# Patient Record
Sex: Female | Born: 1975 | Race: White | Hispanic: No | Marital: Single | Smoking: Never smoker
Health system: Southern US, Community
[De-identification: ages and names within clinical notes are randomized; demographics above are authoritative.]

## PROBLEM LIST (undated history)

## (undated) DIAGNOSIS — T7840XA Allergy, unspecified, initial encounter: Secondary | ICD-10-CM

## (undated) HISTORY — PX: OTHER SURGICAL HISTORY: SHX169

## (undated) HISTORY — DX: Allergy, unspecified, initial encounter: T78.40XA

---

## 2008-05-13 ENCOUNTER — Ambulatory Visit: Payer: Self-pay | Admitting: Unknown Physician Specialty

## 2019-12-11 ENCOUNTER — Ambulatory Visit: Payer: BC Managed Care – PPO | Attending: Internal Medicine

## 2019-12-11 DIAGNOSIS — Z20822 Contact with and (suspected) exposure to covid-19: Secondary | ICD-10-CM | POA: Insufficient documentation

## 2019-12-12 LAB — NOVEL CORONAVIRUS, NAA: SARS-CoV-2, NAA: NOT DETECTED

## 2020-03-18 ENCOUNTER — Encounter: Payer: Self-pay | Admitting: Emergency Medicine

## 2020-03-18 ENCOUNTER — Other Ambulatory Visit: Payer: Self-pay

## 2020-03-18 ENCOUNTER — Inpatient Hospital Stay
Admission: EM | Admit: 2020-03-18 | Discharge: 2020-03-21 | DRG: 580 | Disposition: A | Discharge status: Home / Self Care | Payer: BC Managed Care – PPO | Attending: Internal Medicine | Admitting: Internal Medicine

## 2020-03-18 DIAGNOSIS — L02413 Cutaneous abscess of right upper limb: Secondary | ICD-10-CM | POA: Diagnosis present

## 2020-03-18 DIAGNOSIS — Z6841 Body Mass Index (BMI) 40.0 and over, adult: Secondary | ICD-10-CM

## 2020-03-18 DIAGNOSIS — L03311 Cellulitis of abdominal wall: Secondary | ICD-10-CM | POA: Diagnosis not present

## 2020-03-18 DIAGNOSIS — L02412 Cutaneous abscess of left axilla: Secondary | ICD-10-CM | POA: Diagnosis present

## 2020-03-18 DIAGNOSIS — L02421 Furuncle of right axilla: Secondary | ICD-10-CM | POA: Diagnosis present

## 2020-03-18 DIAGNOSIS — L02211 Cutaneous abscess of abdominal wall: Secondary | ICD-10-CM | POA: Diagnosis not present

## 2020-03-18 DIAGNOSIS — R7303 Prediabetes: Secondary | ICD-10-CM | POA: Diagnosis present

## 2020-03-18 DIAGNOSIS — Z20822 Contact with and (suspected) exposure to covid-19: Secondary | ICD-10-CM | POA: Diagnosis present

## 2020-03-18 HISTORY — DX: Cutaneous abscess of abdominal wall: L02.211

## 2020-03-18 HISTORY — DX: Cellulitis of abdominal wall: L03.311

## 2020-03-18 HISTORY — DX: Cutaneous abscess of right upper limb: L02.413

## 2020-03-18 LAB — CBC WITH DIFFERENTIAL/PLATELET
Abs Immature Granulocytes: 0.04 10*3/uL (ref 0.00–0.07)
Basophils Absolute: 0 10*3/uL (ref 0.0–0.1)
Basophils Relative: 0 %
Eosinophils Absolute: 0.1 10*3/uL (ref 0.0–0.5)
Eosinophils Relative: 1 %
HCT: 38.3 % (ref 36.0–46.0)
Hemoglobin: 11.9 g/dL — ABNORMAL LOW (ref 12.0–15.0)
Immature Granulocytes: 0 %
Lymphocytes Relative: 13 %
Lymphs Abs: 1.5 10*3/uL (ref 0.7–4.0)
MCH: 26 pg (ref 26.0–34.0)
MCHC: 31.1 g/dL (ref 30.0–36.0)
MCV: 83.8 fL (ref 80.0–100.0)
Monocytes Absolute: 0.8 10*3/uL (ref 0.1–1.0)
Monocytes Relative: 6 %
Neutro Abs: 9.5 10*3/uL — ABNORMAL HIGH (ref 1.7–7.7)
Neutrophils Relative %: 80 %
Platelets: 270 10*3/uL (ref 150–400)
RBC: 4.57 MIL/uL (ref 3.87–5.11)
RDW: 15.4 % (ref 11.5–15.5)
WBC: 12 10*3/uL — ABNORMAL HIGH (ref 4.0–10.5)
nRBC: 0 % (ref 0.0–0.2)

## 2020-03-18 LAB — COMPREHENSIVE METABOLIC PANEL
ALT: 17 U/L (ref 0–44)
AST: 21 U/L (ref 15–41)
Albumin: 3.8 g/dL (ref 3.5–5.0)
Alkaline Phosphatase: 73 U/L (ref 38–126)
Anion gap: 10 (ref 5–15)
BUN: 11 mg/dL (ref 6–20)
CO2: 26 mmol/L (ref 22–32)
Calcium: 9 mg/dL (ref 8.9–10.3)
Chloride: 101 mmol/L (ref 98–111)
Creatinine, Ser: 0.68 mg/dL (ref 0.44–1.00)
GFR calc Af Amer: >60 mL/min (ref >60)
GFR calc non Af Amer: >60 mL/min (ref >60)
Glucose, Bld: 95 mg/dL (ref 70–99)
Potassium: 4.4 mmol/L (ref 3.5–5.1)
Sodium: 137 mmol/L (ref 135–145)
Total Bilirubin: 1.4 mg/dL — ABNORMAL HIGH (ref 0.3–1.2)
Total Protein: 8.1 g/dL (ref 6.5–8.1)

## 2020-03-18 LAB — RESPIRATORY PANEL BY RT PCR (FLU A&B, COVID)
Influenza A by PCR: NEGATIVE
Influenza B by PCR: NEGATIVE
SARS Coronavirus 2 by RT PCR: NEGATIVE

## 2020-03-18 LAB — SEDIMENTATION RATE: Sed Rate: 73 mm/hr — ABNORMAL HIGH (ref 0–20)

## 2020-03-18 LAB — LACTIC ACID, PLASMA
Lactic Acid, Venous: 1 mmol/L (ref 0.5–1.9)
Lactic Acid, Venous: 0.9 mmol/L (ref 0.5–1.9)

## 2020-03-18 MED ORDER — SODIUM CHLORIDE 0.9 % IV SOLN
2.0000 g | Freq: Once | INTRAVENOUS | Status: AC
  Administered 2020-03-18: 2 g via INTRAVENOUS
  Filled 2020-03-18: qty 20

## 2020-03-18 MED ORDER — HYDROMORPHONE HCL 1 MG/ML IJ SOLN: 1.0000 mg | INTRAMUSCULAR | Status: DC | PRN

## 2020-03-18 MED ORDER — SODIUM BICARBONATE 8.4 % IV SOLN: 50.0000 meq | Freq: Once | INTRAVENOUS | Status: DC

## 2020-03-18 MED ORDER — OXYCODONE-ACETAMINOPHEN 5-325 MG PO TABS: 1.0000 | ORAL_TABLET | ORAL | Status: DC | PRN

## 2020-03-18 MED ORDER — SODIUM BICARBONATE 4 % IV SOLN
5.0000 mL | Freq: Once | INTRAVENOUS | Status: DC
  Filled 2020-03-18: qty 5

## 2020-03-18 MED ORDER — HYDROMORPHONE HCL 1 MG/ML IJ SOLN
1.0000 mg | Freq: Once | INTRAMUSCULAR | Status: AC
  Administered 2020-03-18: 1 mg via INTRAVENOUS
  Filled 2020-03-18: qty 1

## 2020-03-18 MED ORDER — ACETAMINOPHEN 325 MG PO TABS: 650.0000 mg | ORAL_TABLET | Freq: Four times a day (QID) | ORAL | Status: DC | PRN

## 2020-03-18 MED ORDER — VANCOMYCIN HCL 1250 MG/250ML IV SOLN
1250.0000 mg | INTRAVENOUS | Status: AC
  Administered 2020-03-18 (×2): 1250 mg via INTRAVENOUS
  Filled 2020-03-18 (×3): qty 250

## 2020-03-18 MED ORDER — ONDANSETRON HCL 4 MG/2ML IJ SOLN
4.0000 mg | Freq: Three times a day (TID) | INTRAMUSCULAR | Status: DC | PRN
  Administered 2020-03-18: 4 mg via INTRAVENOUS
  Filled 2020-03-18: qty 2

## 2020-03-18 MED ORDER — ENOXAPARIN SODIUM 40 MG/0.4ML ~~LOC~~ SOLN
40.0000 mg | Freq: Two times a day (BID) | SUBCUTANEOUS | Status: DC
  Administered 2020-03-18 – 2020-03-21 (×6): 40 mg via SUBCUTANEOUS
  Filled 2020-03-18 (×6): qty 0.4

## 2020-03-18 MED ORDER — LIDOCAINE-EPINEPHRINE 2 %-1:100000 IJ SOLN
20.0000 mL | Freq: Once | INTRAMUSCULAR | Status: AC
  Administered 2020-03-18: 20 mL via INTRADERMAL
  Filled 2020-03-18: qty 1

## 2020-03-18 MED ORDER — ONDANSETRON HCL 4 MG/2ML IJ SOLN
4.0000 mg | Freq: Once | INTRAMUSCULAR | Status: AC
  Administered 2020-03-18: 4 mg via INTRAVENOUS
  Filled 2020-03-18: qty 2

## 2020-03-18 MED ORDER — VANCOMYCIN HCL 1750 MG/350ML IV SOLN
1750.0000 mg | Freq: Two times a day (BID) | INTRAVENOUS | Status: DC
  Administered 2020-03-19 – 2020-03-21 (×5): 1750 mg via INTRAVENOUS
  Filled 2020-03-18 (×7): qty 350

## 2020-03-18 NOTE — H&P (Signed)
History and Physical    Regina Snyder:[id number] DOB: 02/19/1976 DOA: 03/18/2020  Referring MD/NP/PA:   PCP: Patient, No Pcp Per   Patient coming from:  The patient is coming from home.  At baseline, pt is independent for most of ADL.        Chief Complaint:  Abscess and abdominal wall pain  HPI: Regina Snyder is a 44 y.o. female without significant medical history, presents with and abdominal wall pain  Patient states that she started having abdominal wall pain 1 week ago, which has been progressively worsening.  The abdominal wall in the right lower abdomen has been erythematous, swelling and tender.  She also noticed a small abscess in the right arm and a small boil in left axillary area. Patient does not have fever, but has chills.  No chest pain, shortness breath, cough, nausea, vomiting, diarrhea or abdominal pain.  No symptoms of UTI or unilateral weakness. She has been seen multiple times at urgent care for this, and has been prescribed Bactrim two days ago. No significant improvement. Over the last 24 hours, the redness has acutely worsened. She has begun draining a significant amount of purulent material in her lower abdomen wall.    ED Course: pt was found to have WBC 12.0, lactic acid 1.0, pending COVID-19 PCR, electrolytes renal function okay, temperature normal, blood pressure 136/57, heart rate 94, oxygen saturation 92% on room air. EDP did I&D for abdominal abscess and right arm. Patient is placed on MedSurg bed for observation.    Review of Systems:   General: no fevers, has chills, no body weight gain, has fatigue HEENT: no blurry vision, hearing changes or sore throat Respiratory: no dyspnea, coughing, wheezing CV: no chest pain, no palpitations GI: no nausea, vomiting, abdominal pain, diarrhea, constipation GU: no dysuria, burning on urination, increased urinary frequency, hematuria  Ext: no leg edema Neuro: no unilateral weakness, numbness, or tingling, no vision  change or hearing loss Skin: has erythema, warmth, tenderness, swelling in right lower abdomen wall.  She also has a small abscess in the right arm and a small boil in left axillary area.  MSK: No muscle spasm, no deformity, no limitation of range of movement in spin Heme: No easy bruising.  Travel history: No recent long distant travel.  Allergy:  Allergies  Allergen Reactions  . Azithromycin Other (See Comments)  . Codeine Nausea Only and Nausea And Vomiting  . Doxycycline Rash    History reviewed. No pertinent past medical history.  Past Surgical History:  Procedure Laterality Date  . eyelid surgery      Social History:  reports that she has never smoked. She has never used smokeless tobacco. She reports that she does not drink alcohol or use drugs.  Family History:  Family History  Problem Relation Age of Onset  . Diabetes Mellitus II Brother      Prior to Admission medications   Not on File    Physical Exam: Vitals:   03/18/20 1134 03/18/20 1152 03/18/20 1611  BP: (!) 136/57  134/79  Pulse: 94  74  Resp: 20  18  Temp: 98.1 F (36.7 C)    TempSrc: Oral    SpO2: 98%  94%  Weight:  (!) 181.4 kg   Height:  5' 10" (1.778 m)    General: Not in acute distress HEENT:       Eyes: PERRL, EOMI, no scleral icterus.       ENT: No discharge from the  ears and nose, no pharynx injection, no tonsillar enlargement.        Neck: No JVD, no bruit, no mass felt. Heme: No neck lymph node enlargement. Cardiac: S1/S2, RRR, No murmurs, No gallops or rubs. Respiratory: No rales, wheezing, rhonchi or rubs. GI: Soft, nondistended, nontender, no rebound pain, no organomegaly, BS present. GU: No hematuria Ext: No pitting leg edema bilaterally. 2+DP/PT pulse bilaterally. Musculoskeletal: No joint deformities, No joint redness or warmth, no limitation of ROM in spin. Skin:  has erythema, warmth, tenderness, swelling in right lower abdomen.  She also has a small abscess in the right  arm and a small boil in left axillary area. S/p I&D for abdominal wall abscess and right arm abscess Neuro: Alert, oriented X3, cranial nerves II-XII grossly intact, moves all extremities normally. Psych: Patient is not psychotic, no suicidal or hemocidal ideation.  Labs on Admission: I have personally reviewed following labs and imaging studies  CBC: Recent Labs  Lab 03/18/20 1146  WBC 12.0*  NEUTROABS 9.5*  HGB 11.9*  HCT 38.3  MCV 83.8  PLT 888   Basic Metabolic Panel: Recent Labs  Lab 03/18/20 1146  NA 137  K 4.4  CL 101  CO2 26  GLUCOSE 95  BUN 11  CREATININE 0.68  CALCIUM 9.0   GFR: Estimated Creatinine Clearance: 157.7 mL/min (by C-G formula based on SCr of 0.68 mg/dL). Liver Function Tests: Recent Labs  Lab 03/18/20 1146  AST 21  ALT 17  ALKPHOS 73  BILITOT 1.4*  PROT 8.1  ALBUMIN 3.8   No results for input(s): LIPASE, AMYLASE in the last 168 hours. No results for input(s): AMMONIA in the last 168 hours. Coagulation Profile: No results for input(s): INR, PROTIME in the last 168 hours. Cardiac Enzymes: No results for input(s): CKTOTAL, CKMB, CKMBINDEX, TROPONINI in the last 168 hours. BNP (last 3 results) No results for input(s): PROBNP in the last 8760 hours. HbA1C: No results for input(s): HGBA1C in the last 72 hours. CBG: No results for input(s): GLUCAP in the last 168 hours. Lipid Profile: No results for input(s): CHOL, HDL, LDLCALC, TRIG, CHOLHDL, LDLDIRECT in the last 72 hours. Thyroid Function Tests: No results for input(s): TSH, T4TOTAL, FREET4, T3FREE, THYROIDAB in the last 72 hours. Anemia Panel: No results for input(s): VITAMINB12, FOLATE, FERRITIN, TIBC, IRON, RETICCTPCT in the last 72 hours. Urine analysis: No results found for: COLORURINE, APPEARANCEUR, LABSPEC, PHURINE, GLUCOSEU, HGBUR, BILIRUBINUR, KETONESUR, PROTEINUR, UROBILINOGEN, NITRITE, LEUKOCYTESUR Sepsis Labs: _0 (procalcitonin:4,lacticidven:4) )No results found  for this or any previous visit (from the past 240 hour(s)).   Radiological Exams on Admission: No results found.   EKG: Independently reviewed.  Not done in ED, will get one.   Assessment/Plan Principal Problem:   Abdominal wall cellulitis Active Problems:   Abscess of abdominal wall   Abscess of right arm   Abdominal wall cellulitis and abscess of abdominal wall and right arm: Patient has leukocytosis with WBC 12.0, but no fever.  Lactic acid 1.0.  Clinically not septic.  Hemodynamically stable. S/p I&D for abdominal wall abscess and right arm abscess by EDP.  Patient failed outpatient Bactrim treatment. Pt does not want to do CT scan of abdomen per EDP.  -will place on MedSurg bed for observation -IV vancomycin (patient received 1 dose of Rocephin in ED) -Follow-up blood culture, ESR and CRP -As needed Percocet and Dilaudid for pain -check A1c   DVT ppx: SQ Lovenox Code Status: Full code Family Communication: not done, no family member is  at bed side.     Disposition Plan:  Anticipate discharge back to previous home environment Consults called:  none Admission status: Med-surg bed for obs    Status is: Observation The patient remains OBS appropriate and will d/c before 2 midnights. Dispo: The patient is from: Home              Anticipated d/c is to: Home              Anticipated d/c date is: 1 day              Patient currently is not medically stable to d/c.           Date of Service 03/18/2020    [facility]   If 7PM-7AM, please contact night-coverage [email or web address] 03/18/2020, 5:34 PM

## 2020-03-18 NOTE — ED Triage Notes (Signed)
Pt sent to ED by PCP for evaluation of abd wall abscess and smaller abscesses to R arm and L axilla. Pt states she saw PCP 2 days ago and had abd area marked, has been on Bactrim since then. Swelling and redness significantly increased past marked area.

## 2020-03-18 NOTE — Consult Note (Signed)
Pharmacy Antibiotic Note  Regina Snyder is a 44 y.o. female admitted on 03/18/2020 with cellulitis.  Pharmacy has been consulted for vancomycin dosing. High concern for MRSA infection.   Plan: Patient will receive a loading dose of 2500 mg total. Will order a maintenance dose of 1750 mg BID for a predicted AUC of 463. Goal AUC if 400-550. Scr used 0.8. Plan to order vancomycin levels in 2-3 days.   Height: 5\' 10"  (177.8 cm) Weight: (!) 181.4 kg (400 lb) IBW/kg (Calculated) : 68.5  Temp (24hrs), Avg:98.1 F (36.7 C), Min:98.1 F (36.7 C), Max:98.1 F (36.7 C)  Recent Labs  Lab 03/18/20 1146 03/18/20 1513  WBC 12.0*  --   CREATININE 0.68  --   LATICACIDVEN 1.0 0.9    Estimated Creatinine Clearance: 157.7 mL/min (by C-G formula based on SCr of 0.68 mg/dL).    Allergies  Allergen Reactions  . Azithromycin Other (See Comments)  . Codeine Nausea Only and Nausea And Vomiting  . Doxycycline Rash    Antimicrobials this admission: 4/22 vancomycin >>  4/22 ceftriaxone >>   Dose adjustments this admission: None  Microbiology results: 4/22 BCx: pending  Thank you for allowing pharmacy to be a part of this patient's care.  5/22, PharmD, BCPS 03/18/2020 5:00 PM

## 2020-03-18 NOTE — ED Notes (Signed)
Redness (Cellulitis) noted to patient's abdomen. Redness has spread past where skin was marked 2 days ago by PCP. Pt afebrile at this time.

## 2020-03-18 NOTE — ED Provider Notes (Addendum)
[facility] Emergency Department Provider Note  ____________________________________________   First MD Initiated Contact with Patient 03/18/20 1526     (approximate)  I have reviewed the triage vital signs and the nursing notes.   HISTORY  Chief Complaint Cellulitis    HPI Regina Snyder is a 44 y.o. female here with lower abdominal pain and swelling.  The patient states that her symptoms started approximately week ago.  She began to have a small area of redness and pain along her right lower abdomen as well as right upper arm.  She has been seen multiple times urgent care for this, and has been prescribed Bactrim.  She has had worsening pain, redness despite Bactrim as well as chills and general fatigue.  Over the last 24 hours, the redness has acutely worsened and spread and she has begun draining a significant amount of purulent material in her lower abdomen.  She has had some chills but no known fevers.  No nausea or vomiting.  She went back to urgent care and was sent here for further evaluation.  She been taking her antibiotics as prescribed.        History reviewed. No pertinent past medical history.  Patient Active Problem List   Diagnosis Date Noted  . Abdominal wall cellulitis 03/18/2020  . Abscess of abdominal wall 03/18/2020  . Abscess of right arm 03/18/2020    Past Surgical History:  Procedure Laterality Date  . eyelid surgery      Prior to Admission medications   Medication Sig Start Date End Date Taking? Authorizing Provider  diphenhydrAMINE (BENADRYL) 50 MG capsule Take by mouth.   Yes [provider]  ibuprofen (ADVIL) 200 MG tablet Take by mouth.   Yes [provider]  sulfamethoxazole-trimethoprim (BACTRIM DS) 800-160 MG tablet Take by mouth. 03/16/20 03/23/20 Yes [provider]    Allergies Azithromycin, Codeine, and Doxycycline  Family History  Problem Relation Age of Onset  . Diabetes Mellitus  II Brother     Social History Social History   Tobacco Use  . Smoking status: Never Smoker  . Smokeless tobacco: Never Used  Substance Use Topics  . Alcohol use: Never  . Drug use: Never    Review of Systems  Review of Systems  Constitutional: Negative for fatigue and fever.  HENT: Negative for congestion and sore throat.   Eyes: Negative for visual disturbance.  Respiratory: Negative for cough and shortness of breath.   Cardiovascular: Negative for chest pain.  Gastrointestinal: Positive for abdominal pain. Negative for diarrhea, nausea and vomiting.  Genitourinary: Negative for flank pain.  Musculoskeletal: Negative for back pain and neck pain.  Skin: Positive for rash and wound.  Neurological: Negative for weakness.  All other systems reviewed and are negative.    ____________________________________________  PHYSICAL EXAM:      VITAL SIGNS: ED Triage Vitals  Enc Vitals Group     BP 03/18/20 1134 (!) 136/57     Pulse Rate 03/18/20 1134 94     Resp 03/18/20 1134 20     Temp 03/18/20 1134 98.1 F (36.7 C)     Temp Source 03/18/20 1134 Oral     SpO2 03/18/20 1134 98 %     Weight 03/18/20 1152 (!) 400 lb (181.4 kg)     Height 03/18/20 1152 5\' 10"  (1.778 m)     Head Circumference --      Peak Flow --      Pain Score 03/18/20 1153 8  Pain Loc --      Pain Edu? --      Excl. in GC? --      Physical Exam Vitals and nursing note reviewed.  Constitutional:      General: She is not in acute distress.    Appearance: She is well-developed.  HENT:     Head: Normocephalic and atraumatic.  Eyes:     Conjunctiva/sclera: Conjunctivae normal.  Cardiovascular:     Rate and Rhythm: Normal rate and regular rhythm.     Heart sounds: Normal heart sounds. No murmur. No friction rub.  Pulmonary:     Effort: Pulmonary effort is normal. No respiratory distress.     Breath sounds: Normal breath sounds. No wheezing or rales.  Abdominal:     General: There is no  distension.     Palpations: Abdomen is soft.     Tenderness: There is no abdominal tenderness.     Comments: Large area of erythema and induration along the lower abdomen, with focal area of fluctuance along a draining wound in the right lower pannus/anterior abdomen.  Musculoskeletal:     Cervical back: Neck supple.  Skin:    General: Skin is warm.     Capillary Refill: Capillary refill takes less than 2 seconds.  Neurological:     Mental Status: She is alert and oriented to person, place, and time.     Motor: No abnormal muscle tone.         ____________________________________________   LABS (all labs ordered are listed, but only abnormal results are displayed)  Labs Reviewed  COMPREHENSIVE METABOLIC PANEL - Abnormal; Notable for the following components:      Result Value   Total Bilirubin 1.4 (*)    All other components within normal limits  CBC WITH DIFFERENTIAL/PLATELET - Abnormal; Notable for the following components:   WBC 12.0 (*)    Hemoglobin 11.9 (*)    Neutro Abs 9.5 (*)    All other components within normal limits  RESPIRATORY PANEL BY RT PCR (FLU A&B, COVID)  CULTURE, BLOOD (ROUTINE X 2)  CULTURE, BLOOD (ROUTINE X 2)  LACTIC ACID, PLASMA  LACTIC ACID, PLASMA  SEDIMENTATION RATE  C-REACTIVE PROTEIN  HEMOGLOBIN A1C  HIV ANTIBODY (ROUTINE TESTING W REFLEX)  BASIC METABOLIC PANEL  CBC    ____________________________________________  EKG:  ________________________________________  RADIOLOGY All imaging, including plain films, CT scans, and ultrasounds, independently reviewed by me, and interpretations confirmed via formal radiology reads.  ED MD interpretation:   None  Official radiology report(s): No results found.  ____________________________________________  PROCEDURES   Procedure(s) performed (including Critical Care):  Marland KitchenMarland KitchenIncision and Drainage  Date/Time: 03/18/2020 7:34 PM Performed by: Shaune Pollack, MD Authorized by: Shaune Pollack, MD   Consent:    Consent obtained:  Verbal   Consent given by:  Patient   Risks discussed:  Infection, damage to other organs, bleeding, incomplete drainage and pain   Alternatives discussed:  Alternative treatment and referral Location:    Type:  Abscess   Size:  6 x 4    Location: Right lower abdominal wall. Pre-procedure details:    Skin preparation:  Betadine Anesthesia (see MAR for exact dosages):    Anesthesia method:  Local infiltration and topical application   Local anesthetic:  Lidocaine 1% WITH epi Procedure type:    Complexity:  Complex Procedure details:    Needle aspiration: no     Incision types:  Single straight   Incision depth:  Subcutaneous  Scalpel blade:  11   Wound management:  Probed and deloculated and irrigated with saline   Drainage:  Purulent   Drainage amount:  Moderate   Wound treatment:  Wound left open   Packing material: penrose drain. Post-procedure details:    Patient tolerance of procedure:  Tolerated well, no immediate complications .Marland KitchenIncision and Drainage  Date/Time: 03/18/2020 7:35 PM Performed by: Shaune Pollack, MD Authorized by: Shaune Pollack, MD   Consent:    Consent obtained:  Verbal   Consent given by:  Patient   Risks discussed:  Bleeding, damage to other organs, incomplete drainage, infection and pain   Alternatives discussed:  Alternative treatment and delayed treatment Location:    Type:  Abscess   Size:  2 x 1   Location:  Upper extremity   Upper extremity location:  Arm   Arm location:  R lower arm Pre-procedure details:    Skin preparation:  Betadine Anesthesia (see MAR for exact dosages):    Anesthesia method:  Local infiltration   Local anesthetic:  Lidocaine 1% WITH epi Procedure type:    Complexity:  Simple Procedure details:    Incision types:  Stab incision   Incision depth:  Dermal   Scalpel blade:  11   Wound management:  Probed and deloculated and irrigated with saline   Drainage:   Bloody   Drainage amount:  Scant   Packing materials:  None Post-procedure details:    Patient tolerance of procedure:  Tolerated well, no immediate complications .1-3 Lead EKG Interpretation Performed by: Shaune Pollack, MD Authorized by: Shaune Pollack, MD     Interpretation: normal     ECG rate:  80-90   ECG rate assessment: normal     Rhythm: sinus rhythm     Ectopy: none     Conduction: normal   Comments:     Indication: early sepsis receiving IV fluids and analgesics    ____________________________________________  INITIAL IMPRESSION / MDM / ASSESSMENT AND PLAN / ED COURSE  As part of my medical decision making, I reviewed the following data within the electronic MEDICAL RECORD NUMBER Nursing notes reviewed and incorporated, Old chart reviewed, Notes from prior ED visits, and [place] Controlled Substance Database       *MILDRETH REEK was evaluated in Emergency Department on 03/18/2020 for the symptoms described in the history of present illness. She was evaluated in the context of the global COVID-19 pandemic, which necessitated consideration that the patient might be at risk for infection with the SARS-CoV-2 virus that causes COVID-19. Institutional protocols and algorithms that pertain to the evaluation of patients at risk for COVID-19 are in a state of rapid change based on information released by regulatory bodies including the CDC and federal and state organizations. These policies and algorithms were followed during the patient's care in the ED.  Some ED evaluations and interventions may be delayed as a result of limited staffing during the pandemic.*     Medical Decision Making:  44 yo F with history as above here with lower abdominal wall cellulitis and multiple small areas of folliculitis. Pt has failed outpt Bactrim therapy and abdominal wall infection is concerning for early panniculitis. Discussed management options with patient including IV ABX with local I&D, CT scan for  deep infection, and surgical referral. Pt is very concerned about cost of care. Bedside ultrasound performed by myself shows focal fluid collection along area of drainage along inferior border of abdominal wall infection. She is afebrile, not tachycardic, with normal  sodium and no signs fo suggest nec fasc or deep infection. Feel it is reasonable to attempt non-operative management with local I&D and penrose placement, broad spectrum ABX, and close monitoring and serial exams. Will need additional imaging/consultation if she does not improve, but given clinical stability and patient preference, feel this is reasonable course at this time.   ____________________________________________  FINAL CLINICAL IMPRESSION(S) / ED DIAGNOSES  Final diagnoses:  Abdominal wall cellulitis  Abscess of abdominal wall  Abscess of right arm     MEDICATIONS GIVEN DURING THIS VISIT:  Medications  vancomycin (VANCOREADY) IVPB 1250 mg/250 mL (1,250 mg Intravenous New Bag/Given 03/18/20 1911)  HYDROmorphone (DILAUDID) injection 1 mg (has no administration in time range)  oxyCODONE-acetaminophen (PERCOCET/ROXICET) 5-325 MG per tablet 1 tablet (has no administration in time range)  ondansetron (ZOFRAN) injection 4 mg (4 mg Intravenous Given 03/18/20 1827)  acetaminophen (TYLENOL) tablet 650 mg (has no administration in time range)  enoxaparin (LOVENOX) injection 40 mg (has no administration in time range)  vancomycin (VANCOREADY) IVPB 1750 mg/350 mL (has no administration in time range)  lidocaine-EPINEPHrine (XYLOCAINE W/EPI) 2 %-1:100000 (with pres) injection 20 mL (20 mLs Intradermal Given 03/18/20 1519)  cefTRIAXone (ROCEPHIN) 2 g in sodium chloride 0.9 % 100 mL IVPB (0 g Intravenous Stopped 03/18/20 1601)  HYDROmorphone (DILAUDID) injection 1 mg (1 mg Intravenous Given 03/18/20 1515)  ondansetron (ZOFRAN) injection 4 mg (4 mg Intravenous Given 03/18/20 1515)     ED Discharge Orders    None       Note:  This  document was prepared using Dragon voice recognition software and may include unintentional dictation errors.   Duffy Bruce, MD 03/18/20 Jeananne Rama    Duffy Bruce, MD 03/18/20 [phone]

## 2020-03-18 NOTE — Consult Note (Signed)
PHARMACY -  BRIEF ANTIBIOTIC NOTE   Pharmacy has received consult(s) for cellulitis from an ED provider.  The patient's profile has been reviewed for ht/wt/allergies/indication/available labs.    One time order(s) placed for vancomycin  Further antibiotics/pharmacy consults should be ordered by admitting physician if indicated.                       Thank you, Ronnald Ramp 03/18/2020  2:36 PM

## 2020-03-19 ENCOUNTER — Observation Stay: Payer: BC Managed Care – PPO

## 2020-03-19 DIAGNOSIS — L02421 Furuncle of right axilla: Secondary | ICD-10-CM | POA: Diagnosis present

## 2020-03-19 DIAGNOSIS — Z20822 Contact with and (suspected) exposure to covid-19: Secondary | ICD-10-CM | POA: Diagnosis present

## 2020-03-19 DIAGNOSIS — R7303 Prediabetes: Secondary | ICD-10-CM | POA: Diagnosis present

## 2020-03-19 DIAGNOSIS — L02413 Cutaneous abscess of right upper limb: Secondary | ICD-10-CM

## 2020-03-19 DIAGNOSIS — Z6841 Body Mass Index (BMI) 40.0 and over, adult: Secondary | ICD-10-CM | POA: Diagnosis not present

## 2020-03-19 DIAGNOSIS — L02412 Cutaneous abscess of left axilla: Secondary | ICD-10-CM | POA: Diagnosis present

## 2020-03-19 DIAGNOSIS — L02211 Cutaneous abscess of abdominal wall: Secondary | ICD-10-CM | POA: Diagnosis present

## 2020-03-19 DIAGNOSIS — L03311 Cellulitis of abdominal wall: Secondary | ICD-10-CM

## 2020-03-19 LAB — CBC
HCT: 34.1 % — ABNORMAL LOW (ref 36.0–46.0)
Hemoglobin: 10.5 g/dL — ABNORMAL LOW (ref 12.0–15.0)
MCH: 26.2 pg (ref 26.0–34.0)
MCHC: 30.8 g/dL (ref 30.0–36.0)
MCV: 85 fL (ref 80.0–100.0)
Platelets: 227 10*3/uL (ref 150–400)
RBC: 4.01 MIL/uL (ref 3.87–5.11)
RDW: 15.2 % (ref 11.5–15.5)
WBC: 8.5 10*3/uL (ref 4.0–10.5)
nRBC: 0 % (ref 0.0–0.2)

## 2020-03-19 LAB — BASIC METABOLIC PANEL
Anion gap: 8 (ref 5–15)
BUN: 11 mg/dL (ref 6–20)
CO2: 27 mmol/L (ref 22–32)
Calcium: 8.7 mg/dL — ABNORMAL LOW (ref 8.9–10.3)
Chloride: 104 mmol/L (ref 98–111)
Creatinine, Ser: 0.62 mg/dL (ref 0.44–1.00)
GFR calc Af Amer: >60 mL/min (ref >60)
GFR calc non Af Amer: >60 mL/min (ref >60)
Glucose, Bld: 100 mg/dL — ABNORMAL HIGH (ref 70–99)
Potassium: 3.9 mmol/L (ref 3.5–5.1)
Sodium: 139 mmol/L (ref 135–145)

## 2020-03-19 LAB — HEMOGLOBIN A1C
Hgb A1c MFr Bld: 5.8 % — ABNORMAL HIGH (ref 4.8–5.6)
Mean Plasma Glucose: 119.76 mg/dL

## 2020-03-19 LAB — C-REACTIVE PROTEIN: CRP: 15.8 mg/dL — ABNORMAL HIGH (ref <1.0)

## 2020-03-19 LAB — HIV ANTIBODY (ROUTINE TESTING W REFLEX): HIV Screen 4th Generation wRfx: NONREACTIVE

## 2020-03-19 MED ORDER — MUPIROCIN 2 % EX OINT
TOPICAL_OINTMENT | Freq: Two times a day (BID) | CUTANEOUS | Status: DC
  Filled 2020-03-19 (×2): qty 22

## 2020-03-19 MED ORDER — IOHEXOL 300 MG/ML  SOLN
125.0000 mL | Freq: Once | INTRAMUSCULAR | Status: AC | PRN
  Administered 2020-03-19: 125 mL via INTRAVENOUS

## 2020-03-19 MED ORDER — IBUPROFEN 600 MG PO TABS
600.0000 mg | ORAL_TABLET | Freq: Four times a day (QID) | ORAL | Status: DC | PRN
  Administered 2020-03-19: 600 mg via ORAL
  Filled 2020-03-19 (×2): qty 1

## 2020-03-19 NOTE — Consult Note (Signed)
WOC Nurse Consult Note: Reason for Consult:abdominal wall cellulitis to right abdominal pannus Wound type:infectious Pressure Injury POA: NA Measurement: 6 cm x 4 cm induration with erythema, extending 8 cm circumferential Penrose drain in place with purulence noted Wound bed: ruddy red with purulence Drainage (amount, consistency, odor) moderate serosanguinous  Periwound:induration with erythema Dressing procedure/placement/frequency: Cleanse right abdominal pannus with NS and pat dry.  Apply mupirocin to abscess site twice daily.  Cover with foam dressing.  Change foam every three days.  Will not follow at this time.  Please re-consult if needed.  Maple Hudson MSN, RN, FNP-BC CWON Wound, Ostomy, Continence Nurse Pager [phone]

## 2020-03-19 NOTE — Progress Notes (Signed)
PROGRESS NOTE    Regina Snyder  JQB:[id number] DOB: 10/02/1976 DOA: 03/18/2020 PCP: Patient, No Pcp Per       Assessment & Plan:   Principal Problem:   Abdominal wall cellulitis Active Problems:   Abscess of abdominal wall   Abscess of right arm   Abdominal wall cellulitis & abscess of abdominal wall & right arm: leukocytosis with WBC 12.0, but no fever on admission.  Lactic acid 1.0.  Clinically not septic. Hemodynamically stable. S/p I&D for abdominal wall abscess and right arm abscess by EDP.  Patient failed outpatient Bactrim treatment. Continue on IV vanco. CT abd/plevis ordered. Blood cxs NGTD. ESR, CRP are both elevated. Percocet and dilaudid prn for pain  Pre-DM: HbA1c is 5.8. Would benefit greatly from weight loss  Morbid obesity: BMI 57.3. Would benefit greatly from weight loss  Leukocytosis: resolved  DVT prophylaxis: lovenox Code Status: full  Family Communication:  Disposition Plan: likely will d/c back home    Consultants:  None   Procedures:    Antimicrobials: vanco   Subjective: Pt c/o abd pain.   Objective: Vitals:   03/18/20 1611 03/18/20 1851 03/18/20 2313 03/19/20 0337  BP: 134/79 (!) 135/91 102/62 113/63  Pulse: 74 85 79 75  Resp: '18 16 19 17  ' Temp:  98.2 F (36.8 C) 97.6 F (36.4 C) 97.9 F (36.6 C)  TempSrc:  Oral Oral Oral  SpO2: 94% 100% 97% 100%  Weight:      Height:       No intake or output data in the 24 hours ending 03/19/20 0802 Filed Weights   03/18/20 1152  Weight: (!) 181.4 kg    Examination:  General exam: Appears calm but uncomfortable  Respiratory system: diminished breath sounds b/l otherwise clear. Respiratory effort normal. Cardiovascular system: S1 & S2+. No rubs, gallops or clicks. No pedal edema. Gastrointestinal system: Abdomen is obese, soft and mild tenderness to palpation.  Hypoactive bowel sounds heard. Abd wound dressing is intact but saturated w/ blood Central nervous system: Alert and oriented.  Moves all 4 extremities. Psychiatry: Judgement and insight appear normal. Anxious mood and affect.     Data Reviewed: I have personally reviewed following labs and imaging studies  CBC: Recent Labs  Lab 03/18/20 1146 03/19/20 0658  WBC 12.0* 8.5  NEUTROABS 9.5*  --   HGB 11.9* 10.5*  HCT 38.3 34.1*  MCV 83.8 85.0  PLT 270 409   Basic Metabolic Panel: Recent Labs  Lab 03/18/20 1146 03/19/20 0658  NA 137 139  K 4.4 3.9  CL 101 104  CO2 26 27  GLUCOSE 95 100*  BUN 11 11  CREATININE 0.68 0.62  CALCIUM 9.0 8.7*   GFR: Estimated Creatinine Clearance: 157.7 mL/min (by C-G formula based on SCr of 0.62 mg/dL). Liver Function Tests: Recent Labs  Lab 03/18/20 1146  AST 21  ALT 17  ALKPHOS 73  BILITOT 1.4*  PROT 8.1  ALBUMIN 3.8   No results for input(s): LIPASE, AMYLASE in the last 168 hours. No results for input(s): AMMONIA in the last 168 hours. Coagulation Profile: No results for input(s): INR, PROTIME in the last 168 hours. Cardiac Enzymes: No results for input(s): CKTOTAL, CKMB, CKMBINDEX, TROPONINI in the last 168 hours. BNP (last 3 results) No results for input(s): PROBNP in the last 8760 hours. HbA1C: No results for input(s): HGBA1C in the last 72 hours. CBG: No results for input(s): GLUCAP in the last 168 hours. Lipid Profile: No results for input(s): CHOL, HDL,  LDLCALC, TRIG, CHOLHDL, LDLDIRECT in the last 72 hours. Thyroid Function Tests: No results for input(s): TSH, T4TOTAL, FREET4, T3FREE, THYROIDAB in the last 72 hours. Anemia Panel: No results for input(s): VITAMINB12, FOLATE, FERRITIN, TIBC, IRON, RETICCTPCT in the last 72 hours. Sepsis Labs: Recent Labs  Lab 03/18/20 1146 03/18/20 1513  LATICACIDVEN 1.0 0.9    Recent Results (from the past 240 hour(s))  Culture, blood (Routine x 2)     Status: None (Preliminary result)   Collection Time: 03/18/20 11:46 AM   Specimen: BLOOD  Result Value Ref Range Status   Specimen Description BLOOD  RIGHT ANTECUBITAL  Final   Special Requests   Final    BOTTLES DRAWN AEROBIC AND ANAEROBIC Blood Culture results may not be optimal due to an excessive volume of blood received in culture bottles   Culture   Final    NO GROWTH < 24 HOURS Performed at [facility], [address] [place], [place] [address]    Report Status PENDING  Incomplete  Culture, blood (Routine x 2)     Status: None (Preliminary result)   Collection Time: 03/18/20 11:46 AM   Specimen: BLOOD  Result Value Ref Range Status   Specimen Description BLOOD LEFT ANTECUBITAL  Final   Special Requests   Final    BOTTLES DRAWN AEROBIC AND ANAEROBIC Blood Culture results may not be optimal due to an excessive volume of blood received in culture bottles   Culture   Final    NO GROWTH < 24 HOURS Performed at [facility], [address] [place], [place] [address]    Report Status PENDING  Incomplete  Respiratory Panel by RT PCR (Flu A&B, Covid) - Nasopharyngeal Swab     Status: None   Collection Time: 03/18/20  5:10 PM   Specimen: Nasopharyngeal Swab  Result Value Ref Range Status   SARS Coronavirus 2 by RT PCR NEGATIVE NEGATIVE Final    Comment: (NOTE) SARS-CoV-2 target nucleic acids are NOT DETECTED. The SARS-CoV-2 RNA is generally detectable in upper respiratoy specimens during the acute phase of infection. The lowest concentration of SARS-CoV-2 viral copies this assay can detect is 131 copies/mL. A negative result does not preclude SARS-Cov-2 infection and should not be used as the sole basis for treatment or other patient management decisions. A negative result may occur with  improper specimen collection/handling, submission of specimen other than nasopharyngeal swab, presence of viral mutation(s) within the areas targeted by this assay, and inadequate number of viral copies (<131 copies/mL). A negative result must be combined with clinical observations, patient history, and epidemiological  information. The expected result is Negative. Fact Sheet for Patients:  PinkCheek.be Fact Sheet for Healthcare Providers:  GravelBags.it This test is not yet ap proved or cleared by the [place] FDA and  has been authorized for detection and/or diagnosis of SARS-CoV-2 by FDA under an Emergency Use Authorization (EUA). This EUA will remain  in effect (meaning this test can be used) for the duration of the COVID-19 declaration under Section 564(b)(1) of the Act, 21 U.S.C. section 360bbb-3(b)(1), unless the authorization is terminated or revoked sooner.    Influenza A by PCR NEGATIVE NEGATIVE Final   Influenza B by PCR NEGATIVE NEGATIVE Final    Comment: (NOTE) The Xpert Xpress SARS-CoV-2/FLU/RSV assay is intended as an aid in  the diagnosis of influenza from Nasopharyngeal swab specimens and  should not be used as a sole basis for treatment. Nasal washings and  aspirates are unacceptable for  Xpert Xpress SARS-CoV-2/FLU/RSV  testing. Fact Sheet for Patients: PinkCheek.be Fact Sheet for Healthcare Providers: GravelBags.it This test is not yet approved or cleared by the [place] FDA and  has been authorized for detection and/or diagnosis of SARS-CoV-2 by  FDA under an Emergency Use Authorization (EUA). This EUA will remain  in effect (meaning this test can be used) for the duration of the  Covid-19 declaration under Section 564(b)(1) of the Act, 21  U.S.C. section 360bbb-3(b)(1), unless the authorization is  terminated or revoked. Performed at [facility], [address], [place], [place] [address]          Radiology Studies: No results found.      Scheduled Meds: . enoxaparin (LOVENOX) injection  40 mg Subcutaneous Q12H   Continuous Infusions: . vancomycin 1,750 mg (03/19/20 0641)     LOS: 0 days    Time spent: 35  mins   Wyvonnia Dusky, MD [facility] Pager [phone]  If 7PM-7AM, please contact night-coverage [email or web address] 03/19/2020, 8:02 AM

## 2020-03-19 NOTE — Consult Note (Signed)
Pharmacy Antibiotic Note  Regina Snyder is a 44 y.o. female admitted on 03/18/2020 with cellulitis.  Pharmacy has been consulted for vancomycin dosing. High concern for MRSA infection.   Plan: Patient received a loading dose of 2500 mg total.   Continue maintenance dose of 1750 mg BID   for a predicted AUC of 463.  Goal AUC if 400-550. Scr used 0.8.   If therapy extends beyond 2-3 days, should considerchange of abx therapy to Zyvox per reagent shortage  Height: 5\' 10"  (177.8 cm) Weight: (!) 181.4 kg (400 lb) IBW/kg (Calculated) : 68.5  Temp (24hrs), Avg:98 F (36.7 C), Min:97.6 F (36.4 C), Max:98.2 F (36.8 C)  Recent Labs  Lab 03/18/20 1146 03/18/20 1513 03/19/20 0658  WBC 12.0*  --  8.5  CREATININE 0.68  --  0.62  LATICACIDVEN 1.0 0.9  --     Estimated Creatinine Clearance: 157.7 mL/min (by C-G formula based on SCr of 0.62 mg/dL).    Allergies  Allergen Reactions  . Azithromycin Other (See Comments)  . Codeine Nausea Only and Nausea And Vomiting  . Doxycycline Rash    Antimicrobials this admission: 4/22 vancomycin >>  4/22 ceftriaxone >>   Dose adjustments this admission: None  Microbiology results: 4/22 BCx: pending  Thank you for allowing pharmacy to be a part of this patient's care.  5/22, PharmD, BCPS Clinical Pharmacist 03/19/2020 8:53 AM

## 2020-03-19 NOTE — Plan of Care (Signed)
  Patient admitted to [facility] with drain in her lower right abdomen post I&D. A&O x4. No complaints of pain. Patient is ambulatory with steady gait; declines sacral patch and prefers not to change her clothes. Vital signs WNL. Drainage present on abdominal dressing. Writer changed prior to HS medication administration. Will continue to monitor.    Problem: Health Behavior/Discharge Planning: Goal: Ability to manage health-related needs will improve Outcome: Progressing   Problem: Clinical Measurements: Goal: Ability to maintain clinical measurements within normal limits will improve Outcome: Progressing   Problem: Coping: Goal: Level of anxiety will decrease Outcome: Progressing   Problem: Safety: Goal: Ability to remain free from injury will improve Outcome: Progressing   Problem: Clinical Measurements: Goal: Ability to avoid or minimize complications of infection will improve Outcome: Progressing   Problem: Skin Integrity: Goal: Skin integrity will improve Outcome: Progressing

## 2020-03-20 LAB — BASIC METABOLIC PANEL
Anion gap: 8 (ref 5–15)
BUN: 11 mg/dL (ref 6–20)
CO2: 27 mmol/L (ref 22–32)
Calcium: 8.8 mg/dL — ABNORMAL LOW (ref 8.9–10.3)
Chloride: 100 mmol/L (ref 98–111)
Creatinine, Ser: 0.78 mg/dL (ref 0.44–1.00)
GFR calc Af Amer: >60 mL/min (ref >60)
GFR calc non Af Amer: >60 mL/min (ref >60)
Glucose, Bld: 102 mg/dL — ABNORMAL HIGH (ref 70–99)
Potassium: 3.9 mmol/L (ref 3.5–5.1)
Sodium: 135 mmol/L (ref 135–145)

## 2020-03-20 LAB — CBC
HCT: 36.4 % (ref 36.0–46.0)
Hemoglobin: 11.2 g/dL — ABNORMAL LOW (ref 12.0–15.0)
MCH: 25.5 pg — ABNORMAL LOW (ref 26.0–34.0)
MCHC: 30.8 g/dL (ref 30.0–36.0)
MCV: 82.9 fL (ref 80.0–100.0)
Platelets: 257 10*3/uL (ref 150–400)
RBC: 4.39 MIL/uL (ref 3.87–5.11)
RDW: 15.4 % (ref 11.5–15.5)
WBC: 6.9 10*3/uL (ref 4.0–10.5)
nRBC: 0 % (ref 0.0–0.2)

## 2020-03-20 MED ORDER — LINEZOLID 600 MG PO TABS: 600.0000 mg | ORAL_TABLET | Freq: Two times a day (BID) | ORAL | 0 refills | Status: AC

## 2020-03-20 NOTE — Progress Notes (Signed)
PROGRESS NOTE    Regina Snyder  [id number]:[id number] DOB: [birth year] DOA: 03/18/2020 PCP: Patient, No Pcp Per       Assessment & Plan:   Principal Problem:   Abdominal wall cellulitis Active Problems:   Abscess of abdominal wall   Abscess of right arm   Abdominal wall cellulitis & abscess of abdominal wall & right arm: leukocytosis with WBC 12.0, but no fever on admission.  Lactic acid 1.0.  Clinically not septic. Hemodynamically stable. S/p I&D for abdominal wall abscess and right arm abscess by EDP.  Patient failed outpatient Bactrim treatment. Continue on IV vanco. CT abd/plevis shows diffuse inflammation but no defined abscess. Blood cxs NGTD. ESR, CRP are both elevated. Percocet and dilaudid prn for pain  Pre-DM: HbA1c is 5.8. Would benefit greatly from weight loss  Morbid obesity: BMI 57.3. Would benefit greatly from weight loss  Leukocytosis: resolved  DVT prophylaxis: lovenox Code Status: full  Family Communication:  Disposition Plan: likely will d/c back home tomorrow if abd wall cellulitis continues to improve    Consultants:  None   Procedures:    Antimicrobials: vanco   Subjective: Pt c/o abd pain.   Objective: Vitals:   03/19/20 0830 03/19/20 1719 03/19/20 1807 03/19/20 2321  BP: 134/89 114/68 (!) 126/93 110/69  Pulse: 85 78 75 89  Resp: '16 16 16 16  ' Temp: 98.2 F (36.8 C) 97.9 F (36.6 C) 98.3 F (36.8 C) 98.3 F (36.8 C)  TempSrc: Oral Oral Oral Oral  SpO2: 98% 98% 98% 96%  Weight:      Height:        Intake/Output Summary (Last 24 hours) at 03/20/2020 0741 Last data filed at 03/20/2020 0730 Gross per 24 hour  Intake 1112.42 ml  Output --  Net 1112.42 ml   Filed Weights   03/18/20 1152  Weight: (!) 181.4 kg    Examination:  General exam: Appears calm & comfortable  Respiratory system: decreased breath sounds b/l otherwise clear. Respiratory effort normal. Cardiovascular system: S1 & S2+. No rubs, gallops or clicks.   Gastrointestinal system: Abdomen is obese, soft and mild tenderness to palpation.  Normal bowel sounds heard. Abd wound dressing is C/D/I Central nervous system: Alert and oriented. Moves all 4 extremities. Psychiatry: Judgement and insight appear normal. Anxious mood and affect.     Data Reviewed: I have personally reviewed following labs and imaging studies  CBC: Recent Labs  Lab 03/18/20 1146 03/19/20 0658  WBC 12.0* 8.5  NEUTROABS 9.5*  --   HGB 11.9* 10.5*  HCT 38.3 34.1*  MCV 83.8 85.0  PLT 270 834   Basic Metabolic Panel: Recent Labs  Lab 03/18/20 1146 03/19/20 0658  NA 137 139  K 4.4 3.9  CL 101 104  CO2 26 27  GLUCOSE 95 100*  BUN 11 11  CREATININE 0.68 0.62  CALCIUM 9.0 8.7*   GFR: Estimated Creatinine Clearance: 157.7 mL/min (by C-G formula based on SCr of 0.62 mg/dL). Liver Function Tests: Recent Labs  Lab 03/18/20 1146  AST 21  ALT 17  ALKPHOS 73  BILITOT 1.4*  PROT 8.1  ALBUMIN 3.8   No results for input(s): LIPASE, AMYLASE in the last 168 hours. No results for input(s): AMMONIA in the last 168 hours. Coagulation Profile: No results for input(s): INR, PROTIME in the last 168 hours. Cardiac Enzymes: No results for input(s): CKTOTAL, CKMB, CKMBINDEX, TROPONINI in the last 168 hours. BNP (last 3 results) No results for input(s): PROBNP in the last 8760  hours. HbA1C: Recent Labs    03/19/20 0658  HGBA1C 5.8*   CBG: No results for input(s): GLUCAP in the last 168 hours. Lipid Profile: No results for input(s): CHOL, HDL, LDLCALC, TRIG, CHOLHDL, LDLDIRECT in the last 72 hours. Thyroid Function Tests: No results for input(s): TSH, T4TOTAL, FREET4, T3FREE, THYROIDAB in the last 72 hours. Anemia Panel: No results for input(s): VITAMINB12, FOLATE, FERRITIN, TIBC, IRON, RETICCTPCT in the last 72 hours. Sepsis Labs: Recent Labs  Lab 03/18/20 1146 03/18/20 1513  LATICACIDVEN 1.0 0.9    Recent Results (from the past 240 hour(s))  Culture,  blood (Routine x 2)     Status: None (Preliminary result)   Collection Time: 03/18/20 11:46 AM   Specimen: BLOOD  Result Value Ref Range Status   Specimen Description BLOOD RIGHT ANTECUBITAL  Final   Special Requests   Final    BOTTLES DRAWN AEROBIC AND ANAEROBIC Blood Culture results may not be optimal due to an excessive volume of blood received in culture bottles   Culture   Final    NO GROWTH 2 DAYS Performed at [facility], [address], [place], [place] [address]    Report Status PENDING  Incomplete  Culture, blood (Routine x 2)     Status: None (Preliminary result)   Collection Time: 03/18/20 11:46 AM   Specimen: BLOOD  Result Value Ref Range Status   Specimen Description BLOOD LEFT ANTECUBITAL  Final   Special Requests   Final    BOTTLES DRAWN AEROBIC AND ANAEROBIC Blood Culture results may not be optimal due to an excessive volume of blood received in culture bottles   Culture   Final    NO GROWTH 2 DAYS Performed at [facility], [address] [place],  [address]    Report Status PENDING  Incomplete  Respiratory Panel by RT PCR (Flu A&B, Covid) - Nasopharyngeal Swab     Status: None   Collection Time: 03/18/20  5:10 PM   Specimen: Nasopharyngeal Swab  Result Value Ref Range Status   SARS Coronavirus 2 by RT PCR NEGATIVE NEGATIVE Final    Comment: (NOTE) SARS-CoV-2 target nucleic acids are NOT DETECTED. The SARS-CoV-2 RNA is generally detectable in upper respiratoy specimens during the acute phase of infection. The lowest concentration of SARS-CoV-2 viral copies this assay can detect is 131 copies/mL. A negative result does not preclude SARS-Cov-2 infection and should not be used as the sole basis for treatment or other patient management decisions. A negative result may occur with  improper specimen collection/handling, submission of specimen other than nasopharyngeal swab, presence of viral mutation(s) within the areas targeted by this  assay, and inadequate number of viral copies (<131 copies/mL). A negative result must be combined with clinical observations, patient history, and epidemiological information. The expected result is Negative. Fact Sheet for Patients:  PinkCheek.be Fact Sheet for Healthcare Providers:  GravelBags.it This test is not yet ap proved or cleared by the [place] FDA and  has been authorized for detection and/or diagnosis of SARS-CoV-2 by FDA under an Emergency Use Authorization (EUA). This EUA will remain  in effect (meaning this test can be used) for the duration of the COVID-19 declaration under Section 564(b)(1) of the Act, 21 U.S.C. section 360bbb-3(b)(1), unless the authorization is terminated or revoked sooner.    Influenza A by PCR NEGATIVE NEGATIVE Final   Influenza B by PCR NEGATIVE NEGATIVE Final    Comment: (NOTE) The Xpert Xpress SARS-CoV-2/FLU/RSV assay is intended as an  aid in  the diagnosis of influenza from Nasopharyngeal swab specimens and  should not be used as a sole basis for treatment. Nasal washings and  aspirates are unacceptable for Xpert Xpress SARS-CoV-2/FLU/RSV  testing. Fact Sheet for Patients: PinkCheek.be Fact Sheet for Healthcare Providers: GravelBags.it This test is not yet approved or cleared by the [place] FDA and  has been authorized for detection and/or diagnosis of SARS-CoV-2 by  FDA under an Emergency Use Authorization (EUA). This EUA will remain  in effect (meaning this test can be used) for the duration of the  Covid-19 declaration under Section 564(b)(1) of the Act, 21  U.S.C. section 360bbb-3(b)(1), unless the authorization is  terminated or revoked. Performed at [facility], [address] [place], [place] [address]          Radiology Studies: CT ABDOMEN PELVIS W CONTRAST  Result Date:  03/19/2020 CLINICAL DATA:  Right periumbilical abscess lanced in the emergency room yesterday. Evaluate for extent of infection. EXAM: CT ABDOMEN AND PELVIS WITH CONTRAST TECHNIQUE: Multidetector CT imaging of the abdomen and pelvis was performed using the standard protocol following bolus administration of intravenous contrast. CONTRAST:  134m OMNIPAQUE IOHEXOL 300 MG/ML  SOLN COMPARISON:  None. FINDINGS: Lower chest: Clear lung bases.  Heart normal in size. Hepatobiliary: Normal liver. Multiple gallstones and a mostly decompressed gallbladder. No wall thickening or inflammation. No bile duct dilation. Pancreas: Unremarkable. No pancreatic ductal dilatation or surrounding inflammatory changes. Spleen: Normal in size without focal abnormality. Adrenals/Urinary Tract: Adrenal glands are unremarkable. Kidneys are normal, without renal calculi, focal lesion, or hydronephrosis. Bladder is unremarkable. Stomach/Bowel: Normal stomach. Small bowel and colon are normal in caliber. No wall thickening. No inflammation. Probable decompressed normal appendix visualized. No evidence of appendicitis. Vascular/Lymphatic: No significant vascular findings are present. No enlarged abdominal or pelvic lymph nodes. Reproductive: Uterus and bilateral adnexa are unremarkable. Other: Inflammation is noted in the subcutaneous fat of the right mid abdomen with small bubbles of subcutaneous air. There is overlying skin thickening. There is no defined abscess. No abdominal wall hernia.  No ascites. Musculoskeletal: No fracture or acute finding. No osteoblastic or osteolytic lesions. IMPRESSION: 1. Reported skin lesion, which was lanced yesterday, appears as a superficial area of inflammation and subcutaneous air with a larger area of diffuse hazy opacity in the subcutaneous fat consistent with inflammation. However, there is no defined abscess. 2. No other evidence of an acute abnormality. 3. Gallstones without evidence of acute  cholecystitis. No other abnormalities within the abdomen or pelvis. Electronically Signed   By: DLajean ManesM.D.   On: 03/19/2020 13:35        Scheduled Meds: . enoxaparin (LOVENOX) injection  40 mg Subcutaneous Q12H  . mupirocin ointment   Topical BID   Continuous Infusions: . vancomycin 1,750 mg (03/20/20 0543)     LOS: 1 day    Time spent: 33 mins   JWyvonnia Dusky MD [facility] Pager [phone]  If 7PM-7AM, please contact night-coverage [email or web address] 03/20/2020, 7:41 AM

## 2020-03-20 NOTE — Progress Notes (Signed)
Dressing changed on abdomen

## 2020-03-20 NOTE — Plan of Care (Signed)
  Problem: Health Behavior/Discharge Planning: Goal: Ability to manage health-related needs will improve Outcome: Progressing   Problem: Clinical Measurements: Goal: Ability to maintain clinical measurements within normal limits will improve Outcome: Progressing   Problem: Coping: Goal: Level of anxiety will decrease Outcome: Progressing   Problem: Safety: Goal: Ability to remain free from injury will improve Outcome: Progressing   Problem: Clinical Measurements: Goal: Ability to avoid or minimize complications of infection will improve Outcome: Progressing   Problem: Skin Integrity: Goal: Skin integrity will improve Outcome: Progressing

## 2020-03-20 NOTE — Progress Notes (Signed)
Dressing changed on abdomen  

## 2020-03-21 LAB — CBC
HCT: 35.3 % — ABNORMAL LOW (ref 36.0–46.0)
Hemoglobin: 11.3 g/dL — ABNORMAL LOW (ref 12.0–15.0)
MCH: 26.3 pg (ref 26.0–34.0)
MCHC: 32 g/dL (ref 30.0–36.0)
MCV: 82.3 fL (ref 80.0–100.0)
Platelets: 260 10*3/uL (ref 150–400)
RBC: 4.29 MIL/uL (ref 3.87–5.11)
RDW: 15.4 % (ref 11.5–15.5)
WBC: 6.4 10*3/uL (ref 4.0–10.5)
nRBC: 0 % (ref 0.0–0.2)

## 2020-03-21 LAB — BASIC METABOLIC PANEL
Anion gap: 6 (ref 5–15)
BUN: 11 mg/dL (ref 6–20)
CO2: 28 mmol/L (ref 22–32)
Calcium: 8.8 mg/dL — ABNORMAL LOW (ref 8.9–10.3)
Chloride: 103 mmol/L (ref 98–111)
Creatinine, Ser: 0.66 mg/dL (ref 0.44–1.00)
GFR calc Af Amer: >60 mL/min (ref >60)
GFR calc non Af Amer: >60 mL/min (ref >60)
Glucose, Bld: 101 mg/dL — ABNORMAL HIGH (ref 70–99)
Potassium: 3.8 mmol/L (ref 3.5–5.1)
Sodium: 137 mmol/L (ref 135–145)

## 2020-03-21 NOTE — Plan of Care (Signed)
  Problem: Health Behavior/Discharge Planning: Goal: Ability to manage health-related needs will improve Outcome: Adequate for Discharge   Problem: Clinical Measurements: Goal: Ability to maintain clinical measurements within normal limits will improve Outcome: Adequate for Discharge   Problem: Coping: Goal: Level of anxiety will decrease Outcome: Adequate for Discharge   Problem: Safety: Goal: Ability to remain free from injury will improve Outcome: Adequate for Discharge   Problem: Clinical Measurements: Goal: Ability to avoid or minimize complications of infection will improve Outcome: Adequate for Discharge   Problem: Skin Integrity: Goal: Skin integrity will improve Outcome: Adequate for Discharge

## 2020-03-21 NOTE — Discharge Summary (Signed)
Physician Discharge Summary  BORA BOST VQM:[id number] DOB: 03/15/1976 DOA: 03/18/2020  PCP: Patient, No Pcp Per  Admit date: 03/18/2020 Discharge date: 03/21/2020  Admitted From: home Disposition: home  Recommendations for Outpatient Follow-up:  1. Follow up with PCP in 1 week, will need to get CBC, BMP while on linezolid  Home Health: no Equipment/Devices:  Discharge Condition: stable CODE STATUS: full  Diet recommendation: Heart Healthy / Carb Modified  Brief/Interim Summary: HPI was taken from Dr. Blaine Hamper: Regina Snyder is a 44 y.o. female without significant medical history, presents with and abdominal wall pain  Patient states that she started having abdominal wall pain 1 week ago, which has been progressively worsening.  The abdominal wall in the right lower abdomen has been erythematous, swelling and tender.  She also noticed a small abscess in the right arm and a small boil in left axillary area. Patient does not have fever, but has chills.  No chest pain, shortness breath, cough, nausea, vomiting, diarrhea or abdominal pain.  No symptoms of UTI or unilateral weakness.She has been seen multiple times at urgent care for this, and has been prescribed Bactrim two days ago. No significant improvement. Over the last 24 hours, the redness has acutely worsened. She has begun draining a significant amount of purulent material in her lower abdomen wall.   ED Course: pt was found to have WBC 12.0, lactic acid 1.0, pending COVID-19 PCR, electrolytes renal function okay, temperature normal, blood pressure 136/57, heart rate 94, oxygen saturation 92% on room air. EDP did I&D for abdominal abscess and right arm. Patient is placed on MedSurg bed for observation.  Hospital Course from Dr. Lenise Herald 03/19/20-03/21/20: Pt presented w/ abd wall abscess & cellulitis as well as right arm abscess. Both abscesses were I&D by the ER physician. Pt was started on IV vancomycin. CT abd/pelvis showed diffuse  inflammation but no defined abscess. ESR, CRP were both elevated. Pt was d/c w/ linezolid x 5 days more and will f/u outpatient w/ PCP w/in 1 week & get CBC, BMP as well. Pt verbalized her understanding. Of note, pt was found to have pre-DM w/ HbA1c on 5.8. Also, pt told me on the day of d/c that she had left axilla abscesses as well. Gen surg consulted and did not recommend I&D but pt can f/u outpatient if symptoms worsen as per Dr. Lysle Pearl.     Discharge Diagnoses:  Principal Problem:   Abdominal wall cellulitis Active Problems:   Abscess of abdominal wall   Abscess of right arm  Abdominal wall cellulitis& abscess of abdominal wall& right arm: leukocytosis with WBC 12.0, but no fever on admission. Lactic acid 1.0. Clinically not septic. Hemodynamically stable. S/p I&D forabdominal wall abscess and right arm abscessby EDP.Patient failed outpatient Bactrim treatment. Transtioned to po linezolid x 5 days more at d/c. CT abd/plevis shows diffuse inflammation but no defined abscess. Blood cxs NGTD. ESR, CRP are both elevated. Percocet and dilaudid prn for pain  Pre-DM: HbA1c is 5.8. Would benefit greatly from weight loss  Morbid obesity: BMI 57.3. Would benefit greatly from weight loss  Leukocytosis: resolved  Discharge Instructions  Discharge Instructions    Diet - low sodium heart healthy   Complete by: As directed    Discharge instructions   Complete by: As directed    F/u PCP in 1 week, will need to get CBC, BMP while taking linezolid   Increase activity slowly   Complete by: As directed  Allergies as of 03/21/2020      Reactions   Azithromycin Other (See Comments)   Codeine Nausea Only, Nausea And Vomiting   Doxycycline Rash      Medication List    STOP taking these medications   sulfamethoxazole-trimethoprim 800-160 MG tablet Commonly known as: BACTRIM DS     TAKE these medications   diphenhydrAMINE 50 MG capsule Commonly known as: BENADRYL Take by  mouth.   ibuprofen 200 MG tablet Commonly known as: ADVIL Take by mouth.   linezolid 600 MG tablet Commonly known as: ZYVOX Take 1 tablet (600 mg total) by mouth 2 (two) times daily for 5 days.       Allergies  Allergen Reactions  . Azithromycin Other (See Comments)  . Codeine Nausea Only and Nausea And Vomiting  . Doxycycline Rash    Consultations:  Gen surg, Dr. Lysle Pearl   Procedures/Studies: CT ABDOMEN PELVIS W CONTRAST  Result Date: 03/19/2020 CLINICAL DATA:  Right periumbilical abscess lanced in the emergency room yesterday. Evaluate for extent of infection. EXAM: CT ABDOMEN AND PELVIS WITH CONTRAST TECHNIQUE: Multidetector CT imaging of the abdomen and pelvis was performed using the standard protocol following bolus administration of intravenous contrast. CONTRAST:  173m OMNIPAQUE IOHEXOL 300 MG/ML  SOLN COMPARISON:  None. FINDINGS: Lower chest: Clear lung bases.  Heart normal in size. Hepatobiliary: Normal liver. Multiple gallstones and a mostly decompressed gallbladder. No wall thickening or inflammation. No bile duct dilation. Pancreas: Unremarkable. No pancreatic ductal dilatation or surrounding inflammatory changes. Spleen: Normal in size without focal abnormality. Adrenals/Urinary Tract: Adrenal glands are unremarkable. Kidneys are normal, without renal calculi, focal lesion, or hydronephrosis. Bladder is unremarkable. Stomach/Bowel: Normal stomach. Small bowel and colon are normal in caliber. No wall thickening. No inflammation. Probable decompressed normal appendix visualized. No evidence of appendicitis. Vascular/Lymphatic: No significant vascular findings are present. No enlarged abdominal or pelvic lymph nodes. Reproductive: Uterus and bilateral adnexa are unremarkable. Other: Inflammation is noted in the subcutaneous fat of the right mid abdomen with small bubbles of subcutaneous air. There is overlying skin thickening. There is no defined abscess. No abdominal wall  hernia.  No ascites. Musculoskeletal: No fracture or acute finding. No osteoblastic or osteolytic lesions. IMPRESSION: 1. Reported skin lesion, which was lanced yesterday, appears as a superficial area of inflammation and subcutaneous air with a larger area of diffuse hazy opacity in the subcutaneous fat consistent with inflammation. However, there is no defined abscess. 2. No other evidence of an acute abnormality. 3. Gallstones without evidence of acute cholecystitis. No other abnormalities within the abdomen or pelvis. Electronically Signed   By: DLajean ManesM.D.   On: 03/19/2020 13:35       Subjective: Pt c/o left armpit pain    Discharge Exam: Vitals:   03/20/20 1643 03/20/20 2323  BP: 123/75 123/83  Pulse: 74 78  Resp: 18 19  Temp: 98.2 F (36.8 C) 98.9 F (37.2 C)  SpO2: 98% 99%   Vitals:   03/19/20 2321 03/20/20 0902 03/20/20 1643 03/20/20 2323  BP: 110/69 (!) 130/91 123/75 123/83  Pulse: 89 75 74 78  Resp: '16  18 19  ' Temp: 98.3 F (36.8 C) 97.8 F (36.6 C) 98.2 F (36.8 C) 98.9 F (37.2 C)  TempSrc: Oral Oral  Oral  SpO2: 96% 97% 98% 99%  Weight:      Height:        General: Pt is alert, awake, not in acute distress. Morbidly obese Cardiovascular: S1/S2 +, no rubs, no  gallops Respiratory: diminished breath sounds b/l otherwise clear.  Abdominal: Soft, NT, obese, bowel sounds +, decreased erythema of abd around dressing, abd dressing is C/D/I  Extremities: b/l LE edema, no cyanosis    The results of significant diagnostics from this hospitalization (including imaging, microbiology, ancillary and laboratory) are listed below for reference.     Microbiology: Recent Results (from the past 240 hour(s))  Culture, blood (Routine x 2)     Status: None (Preliminary result)   Collection Time: 03/18/20 11:46 AM   Specimen: BLOOD  Result Value Ref Range Status   Specimen Description BLOOD RIGHT ANTECUBITAL  Final   Special Requests   Final    BOTTLES DRAWN AEROBIC  AND ANAEROBIC Blood Culture results may not be optimal due to an excessive volume of blood received in culture bottles   Culture   Final    NO GROWTH 3 DAYS Performed at [facility], [address] [place], [place] [address]    Report Status PENDING  Incomplete  Culture, blood (Routine x 2)     Status: None (Preliminary result)   Collection Time: 03/18/20 11:46 AM   Specimen: BLOOD  Result Value Ref Range Status   Specimen Description BLOOD LEFT ANTECUBITAL  Final   Special Requests   Final    BOTTLES DRAWN AEROBIC AND ANAEROBIC Blood Culture results may not be optimal due to an excessive volume of blood received in culture bottles   Culture   Final    NO GROWTH 3 DAYS Performed at [facility], [address] [place], [place] [address]    Report Status PENDING  Incomplete  Respiratory Panel by RT PCR (Flu A&B, Covid) - Nasopharyngeal Swab     Status: None   Collection Time: 03/18/20  5:10 PM   Specimen: Nasopharyngeal Swab  Result Value Ref Range Status   SARS Coronavirus 2 by RT PCR NEGATIVE NEGATIVE Final    Comment: (NOTE) SARS-CoV-2 target nucleic acids are NOT DETECTED. The SARS-CoV-2 RNA is generally detectable in upper respiratoy specimens during the acute phase of infection. The lowest concentration of SARS-CoV-2 viral copies this assay can detect is 131 copies/mL. A negative result does not preclude SARS-Cov-2 infection and should not be used as the sole basis for treatment or other patient management decisions. A negative result may occur with  improper specimen collection/handling, submission of specimen other than nasopharyngeal swab, presence of viral mutation(s) within the areas targeted by this assay, and inadequate number of viral copies (<131 copies/mL). A negative result must be combined with clinical observations, patient history, and epidemiological information. The expected result is Negative. Fact Sheet for Patients:   PinkCheek.be Fact Sheet for Healthcare Providers:  GravelBags.it This test is not yet ap proved or cleared by the [place] FDA and  has been authorized for detection and/or diagnosis of SARS-CoV-2 by FDA under an Emergency Use Authorization (EUA). This EUA will remain  in effect (meaning this test can be used) for the duration of the COVID-19 declaration under Section 564(b)(1) of the Act, 21 U.S.C. section 360bbb-3(b)(1), unless the authorization is terminated or revoked sooner.    Influenza A by PCR NEGATIVE NEGATIVE Final   Influenza B by PCR NEGATIVE NEGATIVE Final    Comment: (NOTE) The Xpert Xpress SARS-CoV-2/FLU/RSV assay is intended as an aid in  the diagnosis of influenza from Nasopharyngeal swab specimens and  should not be used as a sole basis for treatment. Nasal washings and  aspirates are unacceptable for Xpert Xpress  SARS-CoV-2/FLU/RSV  testing. Fact Sheet for Patients: PinkCheek.be Fact Sheet for Healthcare Providers: GravelBags.it This test is not yet approved or cleared by the [place] FDA and  has been authorized for detection and/or diagnosis of SARS-CoV-2 by  FDA under an Emergency Use Authorization (EUA). This EUA will remain  in effect (meaning this test can be used) for the duration of the  Covid-19 declaration under Section 564(b)(1) of the Act, 21  U.S.C. section 360bbb-3(b)(1), unless the authorization is  terminated or revoked. Performed at [facility], [address] [place], [place] [address]      Labs: BNP (last 3 results) No results for input(s): BNP in the last 8760 hours. Basic Metabolic Panel: Recent Labs  Lab 03/18/20 1146 03/19/20 0658 03/20/20 0835 03/21/20 0431  NA 137 139 135 137  K 4.4 3.9 3.9 3.8  CL 101 104 100 103  CO2 '26 27 27 28  ' GLUCOSE 95 100* 102* 101*  BUN '11 11 11 11  ' CREATININE  0.68 0.62 0.78 0.66  CALCIUM 9.0 8.7* 8.8* 8.8*   Liver Function Tests: Recent Labs  Lab 03/18/20 1146  AST 21  ALT 17  ALKPHOS 73  BILITOT 1.4*  PROT 8.1  ALBUMIN 3.8   No results for input(s): LIPASE, AMYLASE in the last 168 hours. No results for input(s): AMMONIA in the last 168 hours. CBC: Recent Labs  Lab 03/18/20 1146 03/19/20 0658 03/20/20 0835 03/21/20 0431  WBC 12.0* 8.5 6.9 6.4  NEUTROABS 9.5*  --   --   --   HGB 11.9* 10.5* 11.2* 11.3*  HCT 38.3 34.1* 36.4 35.3*  MCV 83.8 85.0 82.9 82.3  PLT 270 227 257 260   Cardiac Enzymes: No results for input(s): CKTOTAL, CKMB, CKMBINDEX, TROPONINI in the last 168 hours. BNP: Invalid input(s): POCBNP CBG: No results for input(s): GLUCAP in the last 168 hours. D-Dimer No results for input(s): DDIMER in the last 72 hours. Hgb A1c Recent Labs    03/19/20 0658  HGBA1C 5.8*   Lipid Profile No results for input(s): CHOL, HDL, LDLCALC, TRIG, CHOLHDL, LDLDIRECT in the last 72 hours. Thyroid function studies No results for input(s): TSH, T4TOTAL, T3FREE, THYROIDAB in the last 72 hours.  Invalid input(s): FREET3 Anemia work up No results for input(s): VITAMINB12, FOLATE, FERRITIN, TIBC, IRON, RETICCTPCT in the last 72 hours. Urinalysis No results found for: COLORURINE, APPEARANCEUR, [place], [place], [place], [place], [place], [place], PROTEINUR, UROBILINOGEN, NITRITE, LEUKOCYTESUR Sepsis Labs Invalid input(s): PROCALCITONIN,  WBC,  LACTICIDVEN Microbiology Recent Results (from the past 240 hour(s))  Culture, blood (Routine x 2)     Status: None (Preliminary result)   Collection Time: 03/18/20 11:46 AM   Specimen: BLOOD  Result Value Ref Range Status   Specimen Description BLOOD RIGHT ANTECUBITAL  Final   Special Requests   Final    BOTTLES DRAWN AEROBIC AND ANAEROBIC Blood Culture results may not be optimal due to an excessive volume of blood received in culture bottles   Culture   Final    NO GROWTH 3  DAYS Performed at [facility], [address], [place], [place] [address]    Report Status PENDING  Incomplete  Culture, blood (Routine x 2)     Status: None (Preliminary result)   Collection Time: 03/18/20 11:46 AM   Specimen: BLOOD  Result Value Ref Range Status   Specimen Description BLOOD LEFT ANTECUBITAL  Final   Special Requests   Final    BOTTLES DRAWN AEROBIC AND ANAEROBIC Blood Culture  results may not be optimal due to an excessive volume of blood received in culture bottles   Culture   Final    NO GROWTH 3 DAYS Performed at [facility], [address] [place], [place] [address]    Report Status PENDING  Incomplete  Respiratory Panel by RT PCR (Flu A&B, Covid) - Nasopharyngeal Swab     Status: None   Collection Time: 03/18/20  5:10 PM   Specimen: Nasopharyngeal Swab  Result Value Ref Range Status   SARS Coronavirus 2 by RT PCR NEGATIVE NEGATIVE Final    Comment: (NOTE) SARS-CoV-2 target nucleic acids are NOT DETECTED. The SARS-CoV-2 RNA is generally detectable in upper respiratoy specimens during the acute phase of infection. The lowest concentration of SARS-CoV-2 viral copies this assay can detect is 131 copies/mL. A negative result does not preclude SARS-Cov-2 infection and should not be used as the sole basis for treatment or other patient management decisions. A negative result may occur with  improper specimen collection/handling, submission of specimen other than nasopharyngeal swab, presence of viral mutation(s) within the areas targeted by this assay, and inadequate number of viral copies (<131 copies/mL). A negative result must be combined with clinical observations, patient history, and epidemiological information. The expected result is Negative. Fact Sheet for Patients:  PinkCheek.be Fact Sheet for Healthcare Providers:  GravelBags.it This test is not yet ap proved or cleared by  the [place] FDA and  has been authorized for detection and/or diagnosis of SARS-CoV-2 by FDA under an Emergency Use Authorization (EUA). This EUA will remain  in effect (meaning this test can be used) for the duration of the COVID-19 declaration under Section 564(b)(1) of the Act, 21 U.S.C. section 360bbb-3(b)(1), unless the authorization is terminated or revoked sooner.    Influenza A by PCR NEGATIVE NEGATIVE Final   Influenza B by PCR NEGATIVE NEGATIVE Final    Comment: (NOTE) The Xpert Xpress SARS-CoV-2/FLU/RSV assay is intended as an aid in  the diagnosis of influenza from Nasopharyngeal swab specimens and  should not be used as a sole basis for treatment. Nasal washings and  aspirates are unacceptable for Xpert Xpress SARS-CoV-2/FLU/RSV  testing. Fact Sheet for Patients: PinkCheek.be Fact Sheet for Healthcare Providers: GravelBags.it This test is not yet approved or cleared by the [place] FDA and  has been authorized for detection and/or diagnosis of SARS-CoV-2 by  FDA under an Emergency Use Authorization (EUA). This EUA will remain  in effect (meaning this test can be used) for the duration of the  Covid-19 declaration under Section 564(b)(1) of the Act, 21  U.S.C. section 360bbb-3(b)(1), unless the authorization is  terminated or revoked. Performed at [facility], [address], [place], [place] [address]      Time coordinating discharge: Over 30 minutes  SIGNED:   Wyvonnia Dusky, MD  [facility] 03/21/2020, 1:01 PM Pager   If 7PM-7AM, please contact night-coverage [email or web address]

## 2020-03-21 NOTE — TOC Transition Note (Signed)
Transition of Care [facility]) - CM/SW Discharge Note   Patient Details  Name: Regina Snyder MRN: [id number] Date of Birth: 10/28/1976  Transition of [facility]) CM/SW Contact:  Maud Deed, LCSW Phone Number: 03/21/2020, 1:16 PM   Clinical Narrative:    CSW provided pt with PCP options and price of new medication.         Patient Goals and CMS Choice        Discharge Placement                       Discharge Plan and Services                                     Social Determinants of Health (SDOH) Interventions     Readmission Risk Interventions No flowsheet data found.

## 2020-03-21 NOTE — Discharge Instructions (Signed)

## 2020-03-21 NOTE — Progress Notes (Signed)
Patient discharging home. Instructions given to patient, verbalized understanding.  

## 2020-03-21 NOTE — Consult Note (Signed)
Subjective:   CC: axillary boil  HPI:  Regina Snyder is a 44 y.o. female who was consulted by Mayford Knife for evaluation of above.  First noted a few days ago.  Symptoms include: Pain is sharp, increasing.  Exacerbated by touch.  Alleviated by nothing specific.  Associated with redness and lump in right axilla.     Past Medical History:  has no past medical history on file.  Past Surgical History:  has a past surgical history that includes eyelid surgery.  Family History: family history includes Diabetes Mellitus II in her brother.  Social History:  reports that she has never smoked. She has never used smokeless tobacco. She reports that she does not drink alcohol or use drugs.  Current Medications:  Medications Prior to Admission  Medication Sig Dispense Refill  . diphenhydrAMINE (BENADRYL) 50 MG capsule Take by mouth.    Marland Kitchen ibuprofen (ADVIL) 200 MG tablet Take by mouth.    . sulfamethoxazole-trimethoprim (BACTRIM DS) 800-160 MG tablet Take by mouth.      Allergies:  Allergies  Allergen Reactions  . Azithromycin Other (See Comments)  . Codeine Nausea Only and Nausea And Vomiting  . Doxycycline Rash    ROS:  General: Denies weight loss, weight gain, fatigue, fevers, chills, and night sweats. Eyes: Denies blurry vision, double vision, eye pain, itchy eyes, and tearing. Ears: Denies hearing loss, earache, and ringing in ears. Nose: Denies sinus pain, congestion, infections, runny nose, and nosebleeds. Mouth/throat: Denies hoarseness, sore throat, bleeding gums, and difficulty swallowing. Heart: Denies chest pain, palpitations, racing heart, irregular heartbeat, leg pain or swelling, and decreased activity tolerance. Respiratory: Denies breathing difficulty, shortness of breath, wheezing, cough, and sputum. GI: Denies change in appetite, heartburn, nausea, vomiting, constipation, diarrhea, and blood in stool. GU: Denies difficulty urinating, pain with urinating, urgency, frequency,  blood in urine. Musculoskeletal: Denies joint stiffness, pain, swelling, muscle weakness. Skin: Denies rash, itching, mass, tumors, sores, and boils Neurologic: Denies headache, fainting, dizziness, seizures, numbness, and tingling. Psychiatric: Denies depression, anxiety, difficulty sleeping, and memory loss. Endocrine: Denies heat or cold intolerance, and increased thirst or urination. Blood/lymph: Denies easy bruising, easy bruising, and swollen glands     Objective:     BP 123/83 (BP Location: Left Wrist)   Pulse 78   Temp 98.9 F (37.2 C) (Oral)   Resp 19   Ht 5\' 10"  (1.778 m)   Wt (!) 181.4 kg   SpO2 99%   BMI 57.39 kg/m   Constitutional :  alert, cooperative, appears stated age and no distress  Lymphatics/Throat:  no asymmetry, masses, or scars  Respiratory:  clear to auscultation bilaterally  Cardiovascular:  regular rate and rhythm  Gastrointestinal: soft, non-tender; bowel sounds normal; no masses,  no organomegaly.  Musculoskeletal: Steady gait and movement  Skin: Cool and moist, resolving cellulitis in abdominal wall.  Two punctate folliculitis noted in right axilla, no obvious induration or drainage to indicate actual abscess underlying the two erythematous pores  Psychiatric: Normal affect, non-agitated, not confused       LABS:  CMP Latest Ref Rng & Units 03/21/2020 03/20/2020 03/19/2020  Glucose 70 - 99 mg/dL 03/21/2020) 703(J) 009(F)  BUN 6 - 20 mg/dL 11 11 11   Creatinine 0.44 - 1.00 mg/dL 818(E 9.93  Sodium 135 - 145 mmol/L 137 135 139  Potassium 3.5 - 5.1 mmol/L 3.8 3.9 3.9  Chloride 98 - 111 mmol/L 103 100 104  CO2 22 - 32 mmol/L 28 27 27   Calcium 8.9 -  10.3 mg/dL 8.8(L) 8.8(L) 8.7(L)  Total Protein 6.5 - 8.1 g/dL - - -  Total Bilirubin 0.3 - 1.2 mg/dL - - -  Alkaline Phos 38 - 126 U/L - - -  AST 15 - 41 U/L - - -  ALT 0 - 44 U/L - - -   CBC Latest Ref Rng & Units 03/21/2020 03/20/2020 03/19/2020  WBC 4.0 - 10.5 K/uL 6.4 6.9 8.5  Hemoglobin 12.0 - 15.0  g/dL 11.3(L) 11.2(L) 10.5(L)  Hematocrit 36.0 - 46.0 % 35.3(L) 36.4 34.1(L)  Platelets 150 - 400 K/uL 260 257 227    RADS: n/a  Assessment:     folliculitis, left axilla   Plan:     1. No sign of formed abscess at this point so I&D risks and recovery worse than benefits.  Recommend avoiding shaving, no deodorant to area, and warm compress as needed.  Call office if symptoms persists or worsens, otherwise f/u prn.  Continue abx and further care re: other medical issues per hospitalist.  Ok to be d/c'd from my standpoint.  No charge visit

## 2020-03-22 ENCOUNTER — Emergency Department
Admission: EM | Admit: 2020-03-22 | Discharge: 2020-03-22 | Disposition: A | Discharge status: Home / Self Care | Payer: BC Managed Care – PPO | Attending: Student in an Organized Health Care Education/Training Program | Admitting: Student in an Organized Health Care Education/Training Program

## 2020-03-22 ENCOUNTER — Encounter: Payer: Self-pay | Admitting: Emergency Medicine

## 2020-03-22 ENCOUNTER — Other Ambulatory Visit: Payer: Self-pay

## 2020-03-22 DIAGNOSIS — Z5189 Encounter for other specified aftercare: Secondary | ICD-10-CM

## 2020-03-22 DIAGNOSIS — Z4803 Encounter for change or removal of drains: Secondary | ICD-10-CM | POA: Insufficient documentation

## 2020-03-22 NOTE — Discharge Instructions (Addendum)
Keep the wounds clean, dry, and covered. Take the antibiotics as directed. Follow-up with Dr. Letitia Libra as planned. Return as needed.

## 2020-03-22 NOTE — ED Triage Notes (Signed)
Pt here to ED via POV with post op complaint; Pt came in last Saturday 03/20/20 with initial c/o a spider bite & abd cellulitis.  The area was lanced by Dr. Erma Heritage and pt was admitted and placed on IV abx. Pt was directed by Dr. Erma Heritage to have her abd drain removed. Pt has obvious redness noted around drain that has since spread out further than the pre-marked area from 03/16/20. Pt had a culture preformed of the discharge coming from the wound and it was dx as MRSA. Pt just recently released from [facility].

## 2020-03-22 NOTE — ED Provider Notes (Signed)
[facility] Emergency Department Provider Note ____________________________________________  Time seen: 2206  I have reviewed the triage vital signs and the nursing notes.  HISTORY  Chief Complaint  Post-op Problem  HPI Regina Snyder is a 44 y.o. female returns to the ED for wound check and wound drain removal. She had a local I&D procedure of an abdominal abscess a few days earlier. She presents now with no interim complaints that she has been taking the linezolid as prescribed. She is also scheduled to see her newly selected primary care provider this week. She denies any interim fever, chills, or sweats.   History reviewed. No pertinent past medical history.  Patient Active Problem List   Diagnosis Date Noted  . Abdominal wall cellulitis 03/18/2020  . Abscess of abdominal wall 03/18/2020  . Abscess of right arm 03/18/2020    Past Surgical History:  Procedure Laterality Date  . eyelid surgery      Prior to Admission medications   Medication Sig Start Date End Date Taking? Authorizing Provider  diphenhydrAMINE (BENADRYL) 50 MG capsule Take by mouth.    [provider]  ibuprofen (ADVIL) 200 MG tablet Take by mouth.    [provider]  linezolid (ZYVOX) 600 MG tablet Take 1 tablet (600 mg total) by mouth 2 (two) times daily for 5 days. 03/20/20 03/25/20  Charise Killian, MD    Allergies Azithromycin, Codeine, and Doxycycline  Family History  Problem Relation Age of Onset  . Diabetes Mellitus II Brother     Social History Social History   Tobacco Use  . Smoking status: Never Smoker  . Smokeless tobacco: Never Used  Substance Use Topics  . Alcohol use: Never  . Drug use: Never    Review of Systems  Constitutional: Negative for fever. Cardiovascular: Negative for chest pain. Respiratory: Negative for shortness of breath. Musculoskeletal: Negative for back pain. Skin: Negative for rash. Abdominal wall and RUE abscess  as above. Neurological: Negative for headaches, focal weakness or numbness. ____________________________________________  PHYSICAL EXAM:  VITAL SIGNS: ED Triage Vitals  Enc Vitals Group     BP 03/22/20 2138 (!) 165/90     Pulse Rate 03/22/20 2138 87     Resp 03/22/20 2138 16     Temp 03/22/20 2138 98.3 F (36.8 C)     Temp Source 03/22/20 2138 Oral     SpO2 03/22/20 2131 100 %     Weight 03/22/20 2140 (!) 400 lb (181.4 kg)     Height 03/22/20 2140 5\' 10"  (1.778 m)     Head Circumference --      Peak Flow --      Pain Score 03/22/20 2140 0     Pain Loc --      Pain Edu? --      Excl. in GC? --     Constitutional: Alert and oriented. Well appearing and in no distress. Head: Normocephalic and atraumatic. Cardiovascular: Normal rate, regular rhythm. Normal distal pulses. Respiratory: Normal respiratory effort. No wheezes/rales/rhonchi. Gastrointestinal: Soft and nontender. No distention. Lower abdominal wall abscess noted. Patient with significantly improved surrounding erythema from initial presentation. She has a Penrose drain tied off through 2 incisions in the previous abscess skin. No purulent drainage is appreciated. The Penrose drain is removed without difficulty. Purulent drainage is appreciated. Musculoskeletal: Nontender with normal range of motion in all extremities.  Neurologic:  Normal gait without ataxia. Normal speech and language. No gross focal neurologic deficits are appreciated. Skin:  Skin  is warm, dry and intact. No rash noted. With a superficial abscess to the right antecubital space. The overlying skin is without significant erythema or purulence. There is still palpable underlying induration consistent with a small focal cellulitis. No fluctuance is noted. ____________________________________________  PROCEDURES   Dressing change  Procedures ____________________________________________  INITIAL IMPRESSION / ASSESSMENT AND PLAN / ED COURSE  Patient with  ED evaluation and subsequent wound check following an abdominal wall abscess I&D procedure. Patient had a Penrose drain inserted into the wound abscess. The drain was removed without difficulty. The wound flushed clear and no significant purulence was appreciated. The wound was recovered and the patient is discharged to follow-up with her primary provider as planned. She will continue with the linezolid as prescribed. Return precautions have been reviewed.  Regina Snyder was evaluated in Emergency Department on 03/22/2020 for the symptoms described in the history of present illness. She was evaluated in the context of the global COVID-19 pandemic, which necessitated consideration that the patient might be at risk for infection with the SARS-CoV-2 virus that causes COVID-19. Institutional protocols and algorithms that pertain to the evaluation of patients at risk for COVID-19 are in a state of rapid change based on information released by regulatory bodies including the CDC and federal and state organizations. These policies and algorithms were followed during the patient's care in the ED. ____________________________________________  FINAL CLINICAL IMPRESSION(S) / ED DIAGNOSES  Final diagnoses:  Visit for wound check      Melvenia Needles, PA-C 03/22/20 2343    Merlyn Lot, MD 03/23/20 1302

## 2020-03-23 LAB — CULTURE, BLOOD (ROUTINE X 2)
Culture: NO GROWTH
Culture: NO GROWTH

## 2020-04-28 NOTE — Progress Notes (Signed)
New patient visit   Patient: Regina Snyder   DOB: 05-23-1976   44 y.o. Female  MRN: [id number] Visit Date: 04/29/2020  Today's healthcare provider: Jairo Ben, FNP   Chief Complaint  Patient presents with   New Patient (Initial Visit)    Allergies  Allergen Reactions   Azithromycin Other (See Comments)   Codeine Nausea Only and Nausea And Vomiting   Doxycycline Rash     Subjective    Regina Snyder is a 44 y.o. female who presents today as a new patient to establish care.  HPI  Patient presents today to establish care, she states that she feels failry well today but has a few concerns to address. Patient reports that she was recently admitted to hospital and treated for MRSA. Patient reports since being discharged she has had varies sores appear on her skin, as of recent she reports two sores/bumps on her back that had green drainage. Patient would like to also address concerns of edema in both of her lower extremities, patient states that this has been a ongoing matter for over a year but seems to be more frequent in the past several months.   She is has completed Linezoid a total of 10 days on 03/29/2020 . Areas are healed on abdomen- positive for MRSA and treated.  She had drain removed. She denies any new symptoms or changes. Was seen 03/22/2020 for wound check, as well as at [facility] clinic 03/25/20.  Using chlorhexidine body wash as well. Using Bactroban to small area on right arm that is improving since treatment she reports.   . Patient is currently  Going through workers comp for unrelated matter for injury to knee from fall in bathroom stall, patient states that swelling currently is not related to fall. Patient reports taking otc Ibuprofen 800mg  for relief   She tries to monitor her salt intake. Left leg swells at times and looks discolored. She reports  4 lbs weight loss. She reports she has had this leg issue for a few years. She has pain at times in both  lower legs and some ": purple " appearance to skin at times.  Denies any recent surgeries or recent flights.   She tries to walk for exercise.   She just had second covid vaccine within the past month.  Will defer tetanus for 3 months she is over due to TDAP.  Not up to date with PAP been many years.   Brother with colon cancer. She has had colonoscopy she thinks was clear. She was suppose to go back every three years for repeat but did not due to finances.    Patients diet is poor and she is not actively exercising, sleep patterns are fair. Patient reports that she has not had a pap examination in 16years.  Patient  denies any fever, body aches,chills,  chest pain, shortness of breath, nausea, vomiting, or diarrhea.   Denies dizziness, lightheadedness, pre syncopal or syncopal episodes.   Past Medical History:  Diagnosis Date   Allergy    Past Surgical History:  Procedure Laterality Date   eyelid surgery     Family Status  Relation Name Status   Brother  (Not Specified)   Family History  Problem Relation Age of Onset   Diabetes Mellitus II Brother    Social History   Socioeconomic History   Marital status: Single    Spouse name: Not on file   Number of children: Not on file  Years of education: Not on file   Highest education level: Not on file  Occupational History   Not on file  Tobacco Use   Smoking status: Never Smoker   Smokeless tobacco: Never Used  Substance and Sexual Activity   Alcohol use: Never   Drug use: Never   Sexual activity: Yes  Other Topics Concern   Not on file  Social History Narrative   Not on file   Social Determinants of Health   Financial Resource Strain:    Difficulty of Paying Living Expenses:   Food Insecurity:    Worried About Charity fundraiser in the Last Year:    Arboriculturist in the Last Year:   Transportation Needs:    Film/video editor (Medical):    Lack of Transportation (Non-Medical):     Physical Activity:    Days of Exercise per Week:    Minutes of Exercise per Session:   Stress:    Feeling of Stress :   Social Connections:    Frequency of Communication with Friends and Family:    Frequency of Social Gatherings with Friends and Family:    Attends Religious Services:    Active Member of Clubs or Organizations:    Attends Archivist Meetings:    Marital Status:    Outpatient Medications Prior to Visit  Medication Sig Note   ibuprofen (ADVIL) 800 MG tablet Take 800 mg by mouth 3 (three) times daily. 04/29/2020: Patient reports that she is taking PRN   [DISCONTINUED] diphenhydrAMINE (BENADRYL) 50 MG capsule Take by mouth.    [DISCONTINUED] ibuprofen (ADVIL) 200 MG tablet Take by mouth.    [DISCONTINUED] linezolid (ZYVOX) 600 MG tablet Take 600 mg by mouth 2 (two) times daily.    No facility-administered medications prior to visit.   Allergies  Allergen Reactions   Azithromycin Other (See Comments)   Codeine Nausea Only and Nausea And Vomiting   Doxycycline Rash    Immunization History  Administered Date(s) Administered   Influenza-Unspecified 09/14/2016   PFIZER SARS-COV-2 Vaccination 01/23/2020, 02/13/2020    Health Maintenance  Topic Date Due   TETANUS/TDAP  Never done   PAP SMEAR-Modifier  Never done   INFLUENZA VACCINE  06/27/2020   COVID-19 Vaccine  Completed   HIV Screening  Completed    Patient Care Team: Doreen Beam, FNP as PCP - General (Family Medicine)  Review of Systems  Constitutional: Negative.   HENT: Negative.   Eyes: Negative.   Respiratory: Negative.   Cardiovascular: Positive for leg swelling (both left worse than right ). Negative for chest pain and palpitations.  Gastrointestinal: Negative.   Endocrine: Negative.   Genitourinary: Negative.   Musculoskeletal: Negative.   Skin: Positive for wound (history of MRSA and multiple skin bumps ). Negative for color change, pallor and rash.   Neurological: Negative.   Hematological: Negative.   Psychiatric/Behavioral: Negative.   All other systems reviewed and are negative.     Objective    BP 120/84    Pulse 91    Temp (!) 96.9 F (36.1 C) (Oral)    Resp 16    Ht 5\' 10"  (1.778 m)    Wt (!) 392 lb 3.2 oz (177.9 kg)    SpO2 96%    BMI 56.27 kg/m    Physical Exam Constitutional:      General: She is not in acute distress.    Appearance: She is obese. She is not ill-appearing, toxic-appearing or diaphoretic.  Comments: Patient is alert and oriented and responsive to questions Engages in eye contact with provider. Speaks in full sentences without any pauses without any shortness of breath or distress.    HENT:     Head: Normocephalic and atraumatic.     Right Ear: Tympanic membrane, ear canal and external ear normal. There is no impacted cerumen.     Left Ear: Tympanic membrane, ear canal and external ear normal. There is no impacted cerumen.     Nose: Nose normal. No congestion or rhinorrhea.     Mouth/Throat:     Mouth: Mucous membranes are moist.     Pharynx: No oropharyngeal exudate or posterior oropharyngeal erythema.  Eyes:     General: Lids are normal. Vision grossly intact. No scleral icterus.       Right eye: No discharge.        Left eye: No discharge.     Extraocular Movements: Extraocular movements intact.     Conjunctiva/sclera: Conjunctivae normal.     Pupils: Pupils are equal, round, and reactive to light.      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   Neck:     Vascular: No carotid bruit.  Cardiovascular:     Rate and Rhythm: Normal rate.  Pulmonary:     Effort: Pulmonary effort is normal. No respiratory distress.     Breath sounds: Normal breath sounds. No stridor. No wheezing, rhonchi or rales.  Chest:     Chest wall: No tenderness.  Abdominal:     Palpations: Abdomen is soft.  Genitourinary:     Comments: Declined  Musculoskeletal:        General: No swelling or tenderness.     Cervical back: Normal range of motion and neck supple. No rigidity or tenderness.     Right lower leg: Edema (trace bilaterally, discolored right lower extremity mild erythema and skin discoloration pink with increased varicosities. ) present.     Left lower leg: Edema (left worse than right. 1+ edema with mild to moderate erythema and increased varicosities. negative homans bilaterally. ) present.  Lymphadenopathy:     Cervical: No cervical adenopathy.  Skin:    Capillary Refill: Capillary refill takes less than 2 seconds.     Findings: Erythema (right anterior upper arm 1cm x 1 cm area of mild erythema and tendernss. no induration. Other dried arreas where previous areas of skin infection have been on upper torso and arms. appear to be healing no drainage or tenderness. Abdominal area is improved ) present.     Comments: Patient defers showing me abdomen area she was treated for  States " its all gone".   Neurological:     General: No focal deficit present.     Mental Status: She is oriented to person, place, and time.  Psychiatric:        Mood and Affect: Mood normal.        Behavior: Behavior normal.        Thought Content: Thought content normal.        Judgment: Judgment normal.     Depression Screen PHQ 2/9 Scores 04/29/2020  PHQ - 2 Score 0  PHQ- 9 Score 0   Results for orders placed or performed in visit on 04/29/20  POCT urinalysis dipstick  Result Value Ref Range   Color, UA yellow    Clarity, UA clear    Glucose, UA  Negative Negative   Bilirubin, UA negative    Ketones, UA negative    Spec Grav, UA 1.020 1.010 - 1.025   Blood, UA negative    pH, UA 5.0 5.0 - 8.0   Protein, UA Negative Negative   Urobilinogen, UA 0.2 0.2 or 1.0 E.U./dL   Nitrite, UA negative    Leukocytes, UA Negative Negative   Appearance     Odor      Assessment & Plan       The primary encounter diagnosis  was Morbid obesity (HCC). Diagnoses of Body mass index (BMI) of 50-59.9 in adult [facility]), Venous insufficiency, MRSA infection within last 3 months, Varicose veins of both lower extremities with inflammation, Skin irritation, Screening breast examination, Pain in both lower extremities, Screening cholesterol level, Screening for blood or protein in urine, and Cellulitis of left lower extremity were also pertinent to this visit.   Orders Placed This Encounter  Procedures   MM DIGITAL SCREENING BILATERAL   CBC with Differential/Platelet   Lipid panel   TSH   Comprehensive metabolic panel   POCT urinalysis dipstick   VAS [place] LOWER EXTREMITY VENOUS (DVT)   CPE/ PAP in near future. Fating labs recommended.   She will go for ultrasound rule out DVT, suspect venous insufficiency, diet and morbid obesity as contributing factors but need to rule out DVT.   Discussed weight loss, healthy dietary lifestyle. Morbid obesity and risks related.   Will treat for possible mild cellulitis lower extremities  Left worse than right and also this will cover for arm skin infection.  Return if any symptoms persist, change or worsen at anytime and RED flag symptoms discussed seek care in medical facility immediately.  Meds ordered this encounter  Medications   sulfamethoxazole-trimethoprim (BACTRIM DS) 800-160 MG tablet    Sig: Take 1 tablet by mouth 2 (two) times daily.    Dispense:  14 tablet    Refill:  0    Please let patinet know unable to prescribe linezoid follow up as needed.   mupirocin ointment (BACTROBAN) 2 %    Sig: Apply thin layer to area of concern as needed.    Dispense:  22 g    Refill:  0   Return if symptoms worsen or fail to improve and for fasting labs and CPE/PAP, for at any time for any worsening symptoms.     IBeverely Pace Quinne Pires, FNP, have reviewed all documentation for this visit. The documentation on 04/29/20 for the exam, diagnosis, procedures, and orders are all  accurate and complete.   Jairo Ben, FNP  [facility] [phone] (phone) [phone] (fax)  [facility]

## 2020-04-29 ENCOUNTER — Ambulatory Visit: Payer: BC Managed Care – PPO | Admitting: Adult Health

## 2020-04-29 ENCOUNTER — Encounter: Payer: Self-pay | Admitting: Adult Health

## 2020-04-29 ENCOUNTER — Other Ambulatory Visit: Payer: Self-pay

## 2020-04-29 DIAGNOSIS — I872 Venous insufficiency (chronic) (peripheral): Secondary | ICD-10-CM

## 2020-04-29 DIAGNOSIS — M79605 Pain in left leg: Secondary | ICD-10-CM

## 2020-04-29 DIAGNOSIS — L03116 Cellulitis of left lower limb: Secondary | ICD-10-CM

## 2020-04-29 DIAGNOSIS — I8312 Varicose veins of left lower extremity with inflammation: Secondary | ICD-10-CM

## 2020-04-29 DIAGNOSIS — Z1239 Encounter for other screening for malignant neoplasm of breast: Secondary | ICD-10-CM

## 2020-04-29 DIAGNOSIS — Z7689 Persons encountering health services in other specified circumstances: Secondary | ICD-10-CM

## 2020-04-29 DIAGNOSIS — Z1389 Encounter for screening for other disorder: Secondary | ICD-10-CM | POA: Diagnosis not present

## 2020-04-29 DIAGNOSIS — Z8614 Personal history of Methicillin resistant Staphylococcus aureus infection: Secondary | ICD-10-CM

## 2020-04-29 DIAGNOSIS — R238 Other skin changes: Secondary | ICD-10-CM

## 2020-04-29 DIAGNOSIS — I8311 Varicose veins of right lower extremity with inflammation: Secondary | ICD-10-CM

## 2020-04-29 DIAGNOSIS — Z6841 Body Mass Index (BMI) 40.0 and over, adult: Secondary | ICD-10-CM

## 2020-04-29 DIAGNOSIS — Z13220 Encounter for screening for lipoid disorders: Secondary | ICD-10-CM

## 2020-04-29 DIAGNOSIS — M79604 Pain in right leg: Secondary | ICD-10-CM | POA: Insufficient documentation

## 2020-04-29 DIAGNOSIS — E66813 Obesity, class 3: Secondary | ICD-10-CM | POA: Insufficient documentation

## 2020-04-29 HISTORY — DX: Cellulitis of left lower limb: L03.116

## 2020-04-29 HISTORY — DX: Persons encountering health services in other specified circumstances: Z76.89

## 2020-04-29 HISTORY — DX: Other skin changes: R23.8

## 2020-04-29 LAB — POCT URINALYSIS DIPSTICK
Bilirubin, UA: NEGATIVE
Blood, UA: NEGATIVE
Glucose, UA: NEGATIVE
Ketones, UA: NEGATIVE
Leukocytes, UA: NEGATIVE
Nitrite, UA: NEGATIVE
Protein, UA: NEGATIVE
Spec Grav, UA: 1.02 (ref 1.010–1.025)
Urobilinogen, UA: 0.2 E.U./dL
pH, UA: 5 (ref 5.0–8.0)

## 2020-04-29 MED ORDER — MUPIROCIN 2 % EX OINT: TOPICAL_OINTMENT | CUTANEOUS | 0 refills | Status: DC

## 2020-04-29 MED ORDER — SULFAMETHOXAZOLE-TRIMETHOPRIM 800-160 MG PO TABS: 1.0000 | ORAL_TABLET | Freq: Two times a day (BID) | ORAL | 0 refills | Status: DC

## 2020-04-29 NOTE — Patient Instructions (Addendum)
 Calorie Counting for Weight Loss Calories are units of energy. Your body needs a certain amount of calories from food to keep you going throughout the day. When you eat more calories than your body needs, your body stores the extra calories as fat. When you eat fewer calories than your body needs, your body burns fat to get the energy it needs. Calorie counting means keeping track of how many calories you eat and drink each day. Calorie counting can be helpful if you need to lose weight. If you make sure to eat fewer calories than your body needs, you should lose weight. Ask your health care provider what a healthy weight is for you. For calorie counting to work, you will need to eat the right number of calories in a day in order to lose a healthy amount of weight per week. A dietitian can help you determine how many calories you need in a day and will give you suggestions on how to reach your calorie goal.  A healthy amount of weight to lose per week is usually 1-2 lb (0.5-0.9 kg). This usually means that your daily calorie intake should be reduced by 500-750 calories.  Eating 1,200 - 1,500 calories per day can help most women lose weight.  Eating 1,500 - 1,800 calories per day can help most men lose weight. What is my plan? My goal is to have __________ calories per day. If I have this many calories per day, I should lose around __________ pounds per week. What do I need to know about calorie counting? In order to meet your daily calorie goal, you will need to:  Find out how many calories are in each food you would like to eat. Try to do this before you eat.  Decide how much of the food you plan to eat.  Write down what you ate and how many calories it had. Doing this is called keeping a food log. To successfully lose weight, it is important to balance calorie counting with a healthy lifestyle that includes regular activity. Aim for 150 minutes of moderate exercise (such as walking) or 75  minutes of vigorous exercise (such as running) each week. Where do I find calorie information?  The number of calories in a food can be found on a Nutrition Facts label. If a food does not have a Nutrition Facts label, try to look up the calories online or ask your dietitian for help. Remember that calories are listed per serving. If you choose to have more than one serving of a food, you will have to multiply the calories per serving by the amount of servings you plan to eat. For example, the label on a package of bread might say that a serving size is 1 slice and that there are 90 calories in a serving. If you eat 1 slice, you will have eaten 90 calories. If you eat 2 slices, you will have eaten 180 calories. How do I keep a food log? Immediately after each meal, record the following information in your food log:  What you ate. Don't forget to include toppings, sauces, and other extras on the food.  How much you ate. This can be measured in cups, ounces, or number of items.  How many calories each food and drink had.  The total number of calories in the meal. Keep your food log near you, such as in a small notebook in your pocket, or use a mobile app or website. Some programs will   calculate calories for you and show you how many calories you have left for the day to meet your goal. What are some calorie counting tips?   Use your calories on foods and drinks that will fill you up and not leave you hungry: ? Some examples of foods that fill you up are nuts and nut butters, vegetables, lean proteins, and high-fiber foods like whole grains. High-fiber foods are foods with more than 5 g fiber per serving. ? Drinks such as sodas, specialty coffee drinks, alcohol, and juices have a lot of calories, yet do not fill you up.  Eat nutritious foods and avoid empty calories. Empty calories are calories you get from foods or beverages that do not have many vitamins or protein, such as candy, sweets, and  soda. It is better to have a nutritious high-calorie food (such as an avocado) than a food with few nutrients (such as a bag of chips).  Know how many calories are in the foods you eat most often. This will help you calculate calorie counts faster.  Pay attention to calories in drinks. Low-calorie drinks include water and unsweetened drinks.  Pay attention to nutrition labels for "low fat" or "fat free" foods. These foods sometimes have the same amount of calories or more calories than the full fat versions. They also often have added sugar, starch, or salt, to make up for flavor that was removed with the fat.  Find a way of tracking calories that works for you. Get creative. Try different apps or programs if writing down calories does not work for you. What are some portion control tips?  Know how many calories are in a serving. This will help you know how many servings of a certain food you can have.  Use a measuring cup to measure serving sizes. You could also try weighing out portions on a kitchen scale. With time, you will be able to estimate serving sizes for some foods.  Take some time to put servings of different foods on your favorite plates, bowls, and cups so you know what a serving looks like.  Try not to eat straight from a bag or box. Doing this can lead to overeating. Put the amount you would like to eat in a cup or on a plate to make sure you are eating the right portion.  Use smaller plates, glasses, and bowls to prevent overeating.  Try not to multitask (for example, watch TV or use your computer) while eating. If it is time to eat, sit down at a table and enjoy your food. This will help you to know when you are full. It will also help you to be aware of what you are eating and how much you are eating. What are tips for following this plan? Reading food labels  Check the calorie count compared to the serving size. The serving size may be smaller than what you are used to  eating.  Check the source of the calories. Make sure the food you are eating is high in vitamins and protein and low in saturated and trans fats. Shopping  Read nutrition labels while you shop. This will help you make healthy decisions before you decide to purchase your food.  Make a grocery list and stick to it. Cooking  Try to cook your favorite foods in a healthier way. For example, try baking instead of frying.  Use low-fat dairy products. Meal planning  Use more fruits and vegetables. Half of your plate should be  fruits and vegetables.  Include lean proteins like poultry and fish. How do I count calories when eating out?  Ask for smaller portion sizes.  Consider sharing an entree and sides instead of getting your own entree.  If you get your own entree, eat only half. Ask for a box at the beginning of your meal and put the rest of your entree in it so you are not tempted to eat it.  If calories are listed on the menu, choose the lower calorie options.  Choose dishes that include vegetables, fruits, whole grains, low-fat dairy products, and lean protein.  Choose items that are boiled, broiled, grilled, or steamed. Stay away from items that are buttered, battered, fried, or served with cream sauce. Items labeled "crispy" are usually fried, unless stated otherwise.  Choose water, low-fat milk, unsweetened iced tea, or other drinks without added sugar. If you want an alcoholic beverage, choose a lower calorie option such as a glass of wine or light beer.  Ask for dressings, sauces, and syrups on the side. These are usually high in calories, so you should limit the amount you eat.  If you want a salad, choose a garden salad and ask for grilled meats. Avoid extra toppings like bacon, cheese, or fried items. Ask for the dressing on the side, or ask for olive oil and vinegar or lemon to use as dressing.  Estimate how many servings of a food you are given. For example, a serving of  cooked rice is  cup or about the size of half a baseball. Knowing serving sizes will help you be aware of how much food you are eating at restaurants. The list below tells you how big or small some common portion sizes are based on everyday objects: ? 1 oz--4 stacked dice. ? 3 oz--1 deck of cards. ? 1 tsp--1 die. ? 1 Tbsp-- a ping-pong ball. ? 2 Tbsp--1 ping-pong ball. ?  cup-- baseball. ? 1 cup--1 baseball. Summary  Calorie counting means keeping track of how many calories you eat and drink each day. If you eat fewer calories than your body needs, you should lose weight.  A healthy amount of weight to lose per week is usually 1-2 lb (0.5-0.9 kg). This usually means reducing your daily calorie intake by 500-750 calories.  The number of calories in a food can be found on a Nutrition Facts label. If a food does not have a Nutrition Facts label, try to look up the calories online or ask your dietitian for help.  Use your calories on foods and drinks that will fill you up, and not on foods and drinks that will leave you hungry.  Use smaller plates, glasses, and bowls to prevent overeating. This information is not intended to replace advice given to you by your health care provider. Make sure you discuss any questions you have with your health care provider. Document Revised: 08/02/2018 Document Reviewed: 10/13/2016 Elsevier Patient Education  2020 [employer]   Fat and Cholesterol Restricted Eating Plan Getting too much fat and cholesterol in your diet may cause health problems. Choosing the right foods helps keep your fat and cholesterol at normal levels. This can keep you from getting certain diseases. Your doctor may recommend an eating plan that includes:  Total fat: ______% or less of total calories a day.  Saturated fat: ______% or less of total calories a day.  Cholesterol: less than _________mg a day.  Fiber: ______g a day. What are tips for following this   plan? Meal  planning  At meals, divide your plate into four equal parts: ? Fill one-half of your plate with vegetables and green salads. ? Fill one-fourth of your plate with whole grains. ? Fill one-fourth of your plate with low-fat (lean) protein foods.  Eat fish that is high in omega-3 fats at least two times a week. This includes mackerel, tuna, sardines, and salmon.  Eat foods that are high in fiber, such as whole grains, beans, apples, broccoli, carrots, peas, and barley. General tips   Work with your doctor to lose weight if you need to.  Avoid: ? Foods with added sugar. ? Fried foods. ? Foods with partially hydrogenated oils.  Limit alcohol intake to no more than 1 drink a day for nonpregnant women and 2 drinks a day for men. One drink equals 12 oz of beer, 5 oz of wine, or 1 oz of hard liquor. Reading food labels  Check food labels for: ? Trans fats. ? Partially hydrogenated oils. ? Saturated fat (g) in each serving. ? Cholesterol (mg) in each serving. ? Fiber (g) in each serving.  Choose foods with healthy fats, such as: ? Monounsaturated fats. ? Polyunsaturated fats. ? Omega-3 fats.  Choose grain products that have whole grains. Look for the word "whole" as the first word in the ingredient list. Cooking  Cook foods using low-fat methods. These include baking, boiling, grilling, and broiling.  Eat more home-cooked foods. Eat at restaurants and buffets less often.  Avoid cooking using saturated fats, such as butter, cream, palm oil, palm kernel oil, and coconut oil. Recommended foods  Fruits  All fresh, canned (in natural juice), or frozen fruits. Vegetables  Fresh or frozen vegetables (raw, steamed, roasted, or grilled). Green salads. Grains  Whole grains, such as whole wheat or whole grain breads, crackers, cereals, and pasta. Unsweetened oatmeal, bulgur, barley, quinoa, or brown rice. Corn or whole wheat flour tortillas. Meats and other protein foods  Ground  beef (85% or leaner), grass-fed beef, or beef trimmed of fat. Skinless chicken or turkey. Ground chicken or turkey. Pork trimmed of fat. All fish and seafood. Egg whites. Dried beans, peas, or lentils. Unsalted nuts or seeds. Unsalted canned beans. Nut butters without added sugar or oil. Dairy  Low-fat or nonfat dairy products, such as skim or 1% milk, 2% or reduced-fat cheeses, low-fat and fat-free ricotta or cottage cheese, or plain low-fat and nonfat yogurt. Fats and oils  Tub margarine without trans fats. Light or reduced-fat mayonnaise and salad dressings. Avocado. Olive, canola, sesame, or safflower oils. The items listed above may not be a complete list of foods and beverages you can eat. Contact a dietitian for more information. Foods to avoid Fruits  Canned fruit in heavy syrup. Fruit in cream or butter sauce. Fried fruit. Vegetables  Vegetables cooked in cheese, cream, or butter sauce. Fried vegetables. Grains  White bread. White pasta. White rice. Cornbread. Bagels, pastries, and croissants. Crackers and snack foods that contain trans fat and hydrogenated oils. Meats and other protein foods  Fatty cuts of meat. Ribs, chicken wings, bacon, sausage, bologna, salami, chitterlings, fatback, hot dogs, bratwurst, and packaged lunch meats. Liver and organ meats. Whole eggs and egg yolks. Chicken and turkey with skin. Fried meat. Dairy  Whole or 2% milk, cream, half-and-half, and cream cheese. Whole milk cheeses. Whole-fat or sweetened yogurt. Full-fat cheeses. Nondairy creamers and whipped toppings. Processed cheese, cheese spreads, and cheese curds. Beverages  Alcohol. Sugar-sweetened drinks such as sodas, lemonade, and fruit   drinks. Fats and oils  Butter, stick margarine, lard, shortening, ghee, or bacon fat. Coconut, palm kernel, and palm oils. Sweets and desserts  Corn syrup, sugars, honey, and molasses. Candy. Jam and jelly. Syrup. Sweetened cereals. Cookies, pies, cakes,  donuts, muffins, and ice cream. The items listed above may not be a complete list of foods and beverages you should avoid. Contact a dietitian for more information. Summary  Choosing the right foods helps keep your fat and cholesterol at normal levels. This can keep you from getting certain diseases.  At meals, fill one-half of your plate with vegetables and green salads.  Eat high-fiber foods, like whole grains, beans, apples, carrots, peas, and barley.  Limit added sugar, saturated fats, alcohol, and fried foods. This information is not intended to replace advice given to you by your health care provider. Make sure you discuss any questions you have with your health care provider. Document Revised: 07/17/2018 Document Reviewed: 07/31/2017 Elsevier Patient Education  [place] Maintenance, Female Adopting a healthy lifestyle and getting preventive care are important in promoting health and wellness. Ask your health care provider about:  The right schedule for you to have regular tests and exams.  Things you can do on your own to prevent diseases and keep yourself healthy. What should I know about diet, weight, and exercise? Eat a healthy diet   Eat a diet that includes plenty of vegetables, fruits, low-fat dairy products, and lean protein.  Do not eat a lot of foods that are high in solid fats, added sugars, or sodium. Maintain a healthy weight Body mass index (BMI) is used to identify weight problems. It estimates body fat based on height and weight. Your health care provider can help determine your BMI and help you achieve or maintain a healthy weight. Get regular exercise Get regular exercise. This is one of the most important things you can do for your health. Most adults should:  Exercise for at least 150 minutes each week. The exercise should increase your heart rate and make you sweat (moderate-intensity exercise).  Do strengthening exercises at least  twice a week. This is in addition to the moderate-intensity exercise.  Spend less time sitting. Even light physical activity can be beneficial. Watch cholesterol and blood lipids Have your blood tested for lipids and cholesterol at 46 years of age, then have this test every 5 years. Have your cholesterol levels checked more often if:  Your lipid or cholesterol levels are high.  You are older than 46 years of age.  You are at high risk for heart disease. What should I know about cancer screening? Depending on your health history and family history, you may need to have cancer screening at various ages. This may include screening for:  Breast cancer.  Cervical cancer.  Colorectal cancer.  Skin cancer.  Lung cancer. What should I know about heart disease, diabetes, and high blood pressure? Blood pressure and heart disease  High blood pressure causes heart disease and increases the risk of stroke. This is more likely to develop in people who have high blood pressure readings, are of African descent, or are overweight.  Have your blood pressure checked: ? Every 3-5 years if you are 63-62 years of age. ? Every year if you are 68 years old or older. Diabetes Have regular diabetes screenings. This checks your fasting blood sugar level. Have the screening done:  Once every three years after age 63 if you are at a normal weight  and have a low risk for diabetes.  More often and at a younger age if you are overweight or have a high risk for diabetes. What should I know about preventing infection? Hepatitis B If you have a higher risk for hepatitis B, you should be screened for this virus. Talk with your health care provider to find out if you are at risk for hepatitis B infection. Hepatitis C Testing is recommended for:  Everyone born from [birth year] through 1967.  Anyone with known risk factors for hepatitis C. Sexually transmitted infections (STIs)  Get screened for STIs, including  gonorrhea and chlamydia, if: ? You are sexually active and are younger than 46 years of age. ? You are older than 46 years of age and your health care provider tells you that you are at risk for this type of infection. ? Your sexual activity has changed since you were last screened, and you are at increased risk for chlamydia or gonorrhea. Ask your health care provider if you are at risk.  Ask your health care provider about whether you are at high risk for HIV. Your health care provider may recommend a prescription medicine to help prevent HIV infection. If you choose to take medicine to prevent HIV, you should first get tested for HIV. You should then be tested every 3 months for as long as you are taking the medicine. Pregnancy  If you are about to stop having your period (premenopausal) and you may become pregnant, seek counseling before you get pregnant.  Take 400 to 800 micrograms (mcg) of folic acid every day if you become pregnant.  Ask for birth control (contraception) if you want to prevent pregnancy. Osteoporosis and menopause Osteoporosis is a disease in which the bones lose minerals and strength with aging. This can result in bone fractures. If you are 46 years old or older, or if you are at risk for osteoporosis and fractures, ask your health care provider if you should:  Be screened for bone loss.  Take a calcium or vitamin D supplement to lower your risk of fractures.  Be given hormone replacement therapy (HRT) to treat symptoms of menopause. Follow these instructions at home: Lifestyle  Do not use any products that contain nicotine or tobacco, such as cigarettes, e-cigarettes, and chewing tobacco. If you need help quitting, ask your health care provider.  Do not use street drugs.  Do not share needles.  Ask your health care provider for help if you need support or information about quitting drugs. Alcohol use  Do not drink alcohol if: ? Your health care provider  tells you not to drink. ? You are pregnant, may be pregnant, or are planning to become pregnant.  If you drink alcohol: ? Limit how much you use to 0-1 drink a day. ? Limit intake if you are breastfeeding.  Be aware of how much alcohol is in your drink. In the [place] one drink equals one 12 oz bottle of beer (355 mL), one 5 oz glass of wine (148 mL), or one 1 oz glass of hard liquor (44 mL). General instructions  Schedule regular health, dental, and eye exams.  Stay current with your vaccines.  Tell your health care provider if: ? You often feel depressed. ? You have ever been abused or do not feel safe at home. Summary  Adopting a healthy lifestyle and getting preventive care are important in promoting health and wellness.  Follow your health care provider's instructions about healthy diet, exercising, and  getting tested or screened for diseases.  Follow your health care provider's instructions on monitoring your cholesterol and blood pressure. This information is not intended to replace advice given to you by your health care provider. Make sure you discuss any questions you have with your health care provider. Document Revised: 11/06/2018 Document Reviewed: 11/06/2018 Elsevier Patient Education  2020 [employer].

## 2020-05-03 DIAGNOSIS — S8390XA Sprain of unspecified site of unspecified knee, initial encounter: Secondary | ICD-10-CM | POA: Insufficient documentation

## 2020-05-07 ENCOUNTER — Telehealth: Payer: Self-pay | Admitting: Adult Health

## 2020-05-07 NOTE — Telephone Encounter (Signed)
Contact patient on phone who was very tearful stating that she is in a lot of pain and has a new area that has appeared on her arm today that she states she is sure it is MRSA. Patient states that at visit with Marcelino Duster she had told her that she wanted to be placed back on Linezolid because it helped treat MRSA in past, patient declined taking Sulfamethoxazole-TMP. I informed patient that Marcelino Duster was out of office and  Note on 6/3 read that patient needs to be seen in medical facility for worsening symptoms. I advised patient to go to urgent care for evaluation and not wait until Monday to be assessed, patient stated that she would not go to urgent care and did not want to return back to office and requested that prescription be called in today. Please review office note and advise. KW

## 2020-05-07 NOTE — Telephone Encounter (Signed)
Patient was diagnosed with MRSA and recently hospitalized- she prefers the linezolid for treatment of reoccurring cyst/boils . Patient requested refill at appointment- she does have an area of concern under her arm- patient feels she needs it. Did not need the Sulfamethoxazole- TMP DS tablet. Patient has never had this type of problem before and states it occurred after her COVID vaccines. Patient is having pain with area under R arm and does not want to go back to hospital.Please call- [phone]

## 2020-05-07 NOTE — Telephone Encounter (Signed)
Medication Refill - Medication: linezolid (ZYVOX) 600 MG tablet   Has the patient contacted their pharmacy? Yes.   (Agent: If no, request that the patient contact the pharmacy for the refill.) (Agent: If yes, when and what did the pharmacy advise?)  Preferred Pharmacy (with phone number or street name):  [facility] [address], [place] - [address]  [address] [place] [address]  Phone: [phone] Fax: [phone]     Agent: Please be advised that RX refills may take up to 3 business days. We ask that you follow-up with your pharmacy.

## 2020-05-07 NOTE — Telephone Encounter (Signed)
Copied from CRM [id number]. Topic: General - Call Back - No Documentation >> May 07, 2020  3:24 PM Randol Kern wrote: Pt has a "new place" under her right arm, states that PCP "knows what I am talking about" when asked more questions.  Best contact [phone]

## 2020-05-10 NOTE — Telephone Encounter (Signed)
Do recommend starting Bactrim - MRSA coverage.  She was treated for abdominal abscess with Linezolid. Ok to  schedule in person  office visit for evaluation.  Need to evaluate changing arm lesion in office if Bactrim not improving, will take 3 to 5 days with antibiotics to see improvement.  If fever, chills, nausea, vomiting or severe fatigue should be seen immediately.

## 2020-05-10 NOTE — Telephone Encounter (Signed)
Spoke with patient on the phone who states that Regina Snyder did not know that Bactrim was prescribed to treat MRSA. Patient states that Regina Snyder was given bactrim int he past to treat for MRSA and reports that after one day on medication Regina Snyder did not notice any improvement with her symptoms and states that when Regina Snyder was prescribed Linezolid it helped clear infection, patient seems reluctant to see infectious disease and request that you send in prescription. Patient states that questionable area is under her arm and states that swelling is the size of a ping pong ball, patient was advised that you are out of the office this afternoon and would not be able to address message until the morning. Patient was strongly encourage to pick up prescription for Bactrim. KW

## 2020-05-10 NOTE — Telephone Encounter (Signed)
Bactrim was given as has MRSA coverage as well. Linzolid has to be approved by infectious disease. We can schedule her a follow up to look at the area that has Chemed since previous visit. We can also send her to infectious disease referral if continues this is highly recommended given history.

## 2020-05-14 NOTE — Telephone Encounter (Signed)
Left message for patient to make sure she has picked up medication and started it. Please have [facility] triage speak and triage patient. KW

## 2020-05-17 NOTE — Telephone Encounter (Signed)
Call placed to pt.  Advised of recommendation by PCP to take start the Bactrim to treat MRSA in underarm lesion.  The pt. Stated she took about 2-3 tablets, but hasn't taken it regularly.  Reported the sore under the arm has closed up, and is less tender.  Reported there is a knot still present.  Denied redness or warmth. Denied fever, chills.  Encouraged pt. To take Bactrim on regular schedule, and complete the entire prescription, to clear up the infection.  Advised that it is recommended to seek care if she develops fever, chills, nausea, vomiting, or severe fatigue.  Pt. Verb. Understanding and agreed.  Stated that she has had a lot going on with her health, and recent loss of her mother on 5/17.  Pt. Talked about the events leading up to mother's death.  Offered support and encouragement.  Pt. Reported that she was advised to have an ultrasound of her legs and she is still undecided about this.  reported that it is due to her finances.  Questioned if she has had any changes in her legs.  Stated "no, not really, they still get red."  Reported she wants to think about it longer, and will call when she is ready to have the [place] scheduled.

## 2020-05-25 NOTE — Telephone Encounter (Signed)
Bactrim seems to be improving the area of concern patient had. Appears from triage note she has other issues to address, she can schedule follow up to address and can recheck arm at that time.

## 2020-05-25 NOTE — Telephone Encounter (Signed)
Left message for patient to call office back,okay for [facility] triage to advise patient below. KW

## 2020-05-26 NOTE — Telephone Encounter (Signed)
Phone call to pt.  Advised of recommendation per Marcelino Duster Flinchum to schedule f/u appt. to address new health issues and to recheck underarm sore.  Pt. stated that her underarm sore has "cleared up"; reported she can still feel a knot when she presses around the area.  Stated her legs "have been red for years."  Stated that her bills are "outrageous", from a recent hospitalization.  Reported she is a single parent, and just needs to take care of her current bills.  Reported she will call to sched. an appt. If any worsening.  Advised to monitor for increased redness, warmth, pain, or swelling of legs, and call for an appt.  Pt. Verb. Understanding.  Agreed with plan.

## 2020-05-26 NOTE — Telephone Encounter (Signed)
Regina Snyder, was there additional information from patient- note stops with About ?  Thank you,  Marvell Fuller MSN, AGNP-C, FNP-C  Family Nurse Practitioner  Adult Geriatric Nurse Practitioner

## 2020-05-26 NOTE — Telephone Encounter (Signed)
See below. KW 

## 2020-05-26 NOTE — Telephone Encounter (Signed)
See patient response below. KW 

## 2020-05-26 NOTE — Telephone Encounter (Signed)
Noted  

## 2020-05-27 ENCOUNTER — Encounter (INDEPENDENT_AMBULATORY_CARE_PROVIDER_SITE_OTHER): Payer: Self-pay

## 2020-06-02 ENCOUNTER — Encounter (INDEPENDENT_AMBULATORY_CARE_PROVIDER_SITE_OTHER): Payer: Self-pay

## 2020-06-16 ENCOUNTER — Ambulatory Visit: Payer: Self-pay

## 2020-06-16 ENCOUNTER — Ambulatory Visit (INDEPENDENT_AMBULATORY_CARE_PROVIDER_SITE_OTHER): Payer: BC Managed Care – PPO

## 2020-06-16 ENCOUNTER — Other Ambulatory Visit: Payer: Self-pay

## 2020-06-16 DIAGNOSIS — M79605 Pain in left leg: Secondary | ICD-10-CM

## 2020-06-16 DIAGNOSIS — I8312 Varicose veins of left lower extremity with inflammation: Secondary | ICD-10-CM | POA: Diagnosis not present

## 2020-06-16 DIAGNOSIS — I8311 Varicose veins of right lower extremity with inflammation: Secondary | ICD-10-CM

## 2020-06-16 DIAGNOSIS — M79604 Pain in right leg: Secondary | ICD-10-CM | POA: Diagnosis not present

## 2020-06-16 DIAGNOSIS — I872 Venous insufficiency (chronic) (peripheral): Secondary | ICD-10-CM

## 2020-06-16 NOTE — Telephone Encounter (Signed)
Pt. Reports she noticed a red, swollen area to right lower arm. Tender. Reports she has had MRSA in the past. Does not know if this "is a bug bite or what." Wants to know if she can take Benadryl. Instructed yes. Offered appointment, declines."I don't know if I can afford to come in now." "I'll see how it does. If it gets worse I'll call back."  Answer Assessment - Initial Assessment Questions 1. APPEARANCE of BOIL: "What does the boil look like?"      Swollen, red 2. LOCATION: "Where is the boil located?"      Right lower arm 3. NUMBER: "How many boils are there?"      1 4. SIZE: "How big is the boil?" (e.g., inches, cm; compare to size of a coin or other object)     3 inch 5. ONSET: "When did the boil start?"     Started 2 days ago 6. PAIN: "Is there any pain?" If Yes, ask: "How bad is the pain?"   (Scale 1-10; or mild, moderate, severe)     1 7. FEVER: "Do you have a fever?" If Yes, ask: "What is it, how was it measured, and when did it start?"      No 8. SOURCE: "Have you been around anyone with boils or other Staph infections?" "Have you ever had boils before?"     Yes 9. OTHER SYMPTOMS: "Do you have any other symptoms?" (e.g., shaking chills, weakness, rash elsewhere on body)     No 10. PREGNANCY: "Is there any chance you are pregnant?" "When was your last menstrual period?"       No  Protocols used: BOIL (SKIN ABSCESS)-A-AH

## 2020-06-16 NOTE — Telephone Encounter (Signed)
Appointment/evaluation advised

## 2020-06-17 ENCOUNTER — Ambulatory Visit: Payer: BC Managed Care – PPO | Admitting: Adult Health

## 2020-06-17 ENCOUNTER — Encounter: Payer: Self-pay | Admitting: Adult Health

## 2020-06-17 VITALS — BP 122/84 | HR 80 | Temp 96.9°F | Ht 70.0 in | Wt 394.2 lb

## 2020-06-17 DIAGNOSIS — L02413 Cutaneous abscess of right upper limb: Secondary | ICD-10-CM

## 2020-06-17 DIAGNOSIS — Z8614 Personal history of Methicillin resistant Staphylococcus aureus infection: Secondary | ICD-10-CM

## 2020-06-17 MED ORDER — SULFAMETHOXAZOLE-TRIMETHOPRIM 800-160 MG PO TABS: 1.0000 | ORAL_TABLET | Freq: Two times a day (BID) | ORAL | 0 refills | Status: DC

## 2020-06-17 NOTE — Patient Instructions (Addendum)
Labs recommend   Skin Abscess  A skin abscess is an infected area on or under your skin that contains a collection of pus and other material. An abscess may also be called a furuncle, carbuncle, or boil. An abscess can occur in or on almost any part of your body. Some abscesses break open (rupture) on their own. Most continue to get worse unless they are treated. The infection can spread deeper into the body and eventually into your blood, which can make you feel ill. Treatment usually involves draining the abscess. What are the causes? An abscess occurs when germs, like bacteria, pass through your skin and cause an infection. This may be caused by:  A scrape or cut on your skin.  A puncture wound through your skin, including a needle injection or insect bite.  Blocked oil or sweat glands.  Blocked and infected hair follicles.  A cyst that forms beneath your skin (sebaceous cyst) and becomes infected. What increases the risk? This condition is more likely to develop in people who:  Have a weak body defense system (immune system).  Have diabetes.  Have dry and irritated skin.  Get frequent injections or use illegal IV drugs.  Have a foreign body in a wound, such as a splinter.  Have problems with their lymph system or veins. What are the signs or symptoms? Symptoms of this condition include:  A painful, firm bump under the skin.  A bump with pus at the top. This may break through the skin and drain. Other symptoms include:  Redness surrounding the abscess site.  Warmth.  Swelling of the lymph nodes (glands) near the abscess.  Tenderness.  A sore on the skin. How is this diagnosed? This condition may be diagnosed based on:  A physical exam.  Your medical history.  A sample of pus. This may be used to find out what is causing the infection.  Blood tests.  Imaging tests, such as an ultrasound, CT scan, or MRI. How is this treated? A small abscess that  drains on its own may not need treatment. Treatment for larger abscesses may include:  Moist heat or heat pack applied to the area several times a day.  A procedure to drain the abscess (incision and drainage).  Antibiotic medicines. For a severe abscess, you may first get antibiotics through an IV and then change to antibiotics by mouth. Follow these instructions at home: Medicines   Take over-the-counter and prescription medicines only as told by your health care provider.  If you were prescribed an antibiotic medicine, take it as told by your health care provider. Do not stop taking the antibiotic even if you start to feel better. Abscess care   If you have an abscess that has not drained, apply heat to the affected area. Use the heat source that your health care provider recommends, such as a moist heat pack or a heating pad. ? Place a towel between your skin and the heat source. ? Leave the heat on for 20-30 minutes. ? Remove the heat if your skin turns bright red. This is especially important if you are unable to feel pain, heat, or cold. You may have a greater risk of getting burned.  Follow instructions from your health care provider about how to take care of your abscess. Make sure you: ? Cover the abscess with a bandage (dressing). ? Change your dressing or gauze as told by your health care provider. ? Wash your hands with soap and water  before you change the dressing or gauze. If soap and water are not available, use hand sanitizer.  Check your abscess every day for signs of a worsening infection. Check for: ? More redness, swelling, or pain. ? More fluid or blood. ? Warmth. ? More pus or a bad smell. General instructions  To avoid spreading the infection: ? Do not share personal care items, towels, or hot tubs with others. ? Avoid making skin contact with other people.  Keep all follow-up visits as told by your health care provider. This is important. Contact a  health care provider if you have:  More redness, swelling, or pain around your abscess.  More fluid or blood coming from your abscess.  Warm skin around your abscess.  More pus or a bad smell coming from your abscess.  A fever.  Muscle aches.  Chills or a general ill feeling. Get help right away if you:  Have severe pain.  See red streaks on your skin spreading away from the abscess. Summary  A skin abscess is an infected area on or under your skin that contains a collection of pus and other material.  A small abscess that drains on its own may not need treatment.  Treatment for larger abscesses may include having a procedure to drain the abscess and taking an antibiotic. This information is not intended to replace advice given to you by your health care provider. Make sure you discuss any questions you have with your health care provider. Document Revised: 03/06/2019 Document Reviewed: 12/27/2017 Elsevier Patient Education  2020 Elsevier Inc. Sulfamethoxazole; Trimethoprim, SMX-TMP tablets What is this medicine? SULFAMETHOXAZOLE; TRIMETHOPRIM or SMX-TMP (suhl fuh meth OK suh zohl; trye METH oh prim) is a combination of a sulfonamide antibiotic and a second antibiotic, trimethoprim. It is used to treat or prevent certain kinds of bacterial infections. It will not work for colds, flu, or other viral infections. This medicine may be used for other purposes; ask your health care provider or pharmacist if you have questions. COMMON BRAND NAME(S): Bacter-Aid DS, Bactrim, Bactrim DS, Septra, Septra DS What should I tell my health care provider before I take this medicine? They need to know if you have any of these conditions:  G6PD deficiency  HIV or AIDS  kidney disease  liver disease  low platelets  low red blood cell counts  poor nutrition  stomach or intestine problems like colitis  thyroid disease  an unusual or allergic reaction to sulfamethoxazole,  trimethoprim, sulfa drugs, other drugs, foods, dyes, or preservatives  pregnant or trying to get pregnant  breast-feeding How should I use this medicine? Take this medicine by mouth with a glass of water. Follow the directions on the prescription label. Take your medicine at regular intervals. Do not take it more often than directed. Take all of your medicine as directed even if you think you are better. Do not skip doses or stop your medicine early. Talk to your pediatrician regarding the use of this medicine in children. While this drug may be prescribed for children as young as 2 months for selected conditions, precautions do apply. Overdosage: If you think you have taken too much of this medicine contact a poison control center or emergency room at once. NOTE: This medicine is only for you. Do not share this medicine with others. What if I miss a dose? If you miss a dose, take it as soon as you can. If it is almost time for your next dose, take only that  dose. Do not take double or extra doses. What may interact with this medicine? Do not take this medicine with any of the following medications:  dofetilide This medicine may also interact with the following medications:  amantadine  birth control pills  certain medicines for blood pressure, heart disease  certain medicines for depression, like amitriptyline  certain medicines for diabetes, like glipizide or glyburide  certain medicines that treat or prevent blood clots like warfarin  cyclosporine  digoxin  diuretics  indomethacin  methotrexate  phenytoin  procainamide  pyrimethamine  zidovudine This list may not describe all possible interactions. Give your health care provider a list of all the medicines, herbs, non-prescription drugs, or dietary supplements you use. Also tell them if you smoke, drink alcohol, or use illegal drugs. Some items may interact with your medicine. What should I watch for while using  this medicine? Tell your health care provider if your symptoms do not start to get better or if they get worse. Do not treat diarrhea with over the counter products. Contact your health care provider if you have diarrhea that lasts more than 2 days or if it is severe and watery. This drug may cause serious skin reactions. They can happen weeks to months after starting the drug. Contact your health care provider right away if you notice fevers or flu-like symptoms with a rash. The rash may be red or purple and then turn into blisters or peeling of the skin. Or, you might notice a red rash with swelling of the face, lips or lymph nodes in your neck or under your arms. This drug can make you more sensitive to the sun. Keep out of the sun. If you cannot avoid being in the sun, wear protective clothing and sunscreen. Do not use sun lamps or tanning beds/booths. Be careful brushing or flossing your teeth or using a toothpick because you may get an infection or bleed more easily. If you have any dental work done, tell your dentist you are receiving this drug. What side effects may I notice from receiving this medicine? Side effects that you should report to your doctor or health care professional as soon as possible:  allergic reactions (skin rash, itching or hives; swelling of the face, lips, or tongue)  bloody or watery diarrhea  heartbeat rhythm changes (trouble breathing; chest pain; dizziness; fast, irregular heartbeat; feeling faint or lightheaded, falls,)  fever  high potassium levels (chest pain; fast, irregular heartbeat; muscle weakness)  kidney injury (trouble passing urine or change in the amount of urine)  low blood sugar (feeling anxious; confusion; dizziness; increased hunger; unusually weak or tired; increased sweating; shakiness; cold, clammy skin; irritable; headache; blurred vision; fast heartbeat; loss of consciousness)  low red blood cell counts (trouble breathing; feeling faint;  lightheaded, falls; unusually weak or tired)  rash, fever, and swollen lymph nodes  redness, blistering, peeling, or loosening of the skin, including inside the mouth  unusual bruising or bleeding Side effects that usually do not require medical attention (report to your doctor or health care professional if they continue or are bothersome):  loss of appetite  nausea  vomiting This list may not describe all possible side effects. Call your doctor for medical advice about side effects. You may report side effects to FDA at 1-800-FDA-1088. Where should I keep my medicine? Keep out of the reach of children. Store between 15 and 25 degrees C (59 to 77 degrees F). Protect from light. Keep the container tightly closed. Throw  away any unused drug after the expiration date. NOTE: This sheet is a summary. It may not cover all possible information. If you have questions about this medicine, talk to your doctor, pharmacist, or health care provider.  2020 Elsevier/Gold Standard (2019-08-01 12:53:51)

## 2020-06-17 NOTE — Progress Notes (Signed)
Established patient visit   Patient: Regina Snyder   DOB: 06-19-1976   44 y.o. Female  MRN: [id number] Visit Date: 06/17/2020  Today's healthcare provider: Jairo Ben, FNP   Chief Complaint  Patient presents with   Skin Problem   Subjective    HPI  Patient is in office for a skin problem on her right forearm. Patient states she has had this problem since Sunday. She states she has tried using Bactroban twice a day since the problem became present. She also stated she has used alcohol, and hydrogen peroxide on her problem. Patient has stated the problem has been getting worse. Patient has been seen in the ED 03/22/2020 for MRSA and believe it might be the same reason.  Right lower arm has closed sore, boil that has been there since last Sunday.   All sites have cleared from previous visit.   Patient  denies any fever, body aches,chills, rash, chest pain, shortness of breath, nausea, vomiting, or diarrhea.        Medications: Outpatient Medications Prior to Visit  Medication Sig   ibuprofen (ADVIL) 800 MG tablet Take 800 mg by mouth 3 (three) times daily.   mupirocin ointment (BACTROBAN) 2 % Apply thin layer to area of concern as needed.   [DISCONTINUED] sulfamethoxazole-trimethoprim (BACTRIM DS) 800-160 MG tablet Take 1 tablet by mouth 2 (two) times daily. (Patient not taking: Reported on 06/17/2020)   No facility-administered medications prior to visit.    Review of Systems  Constitutional: Negative.   HENT: Negative.   Respiratory: Negative.   Cardiovascular: Negative.   Gastrointestinal: Negative.  Negative for abdominal distention.  Genitourinary: Negative.   Musculoskeletal: Negative.   Skin: Negative.   Psychiatric/Behavioral: Negative.  Negative for behavioral problems.      Objective    BP 122/84 (BP Location: Left Arm, Patient Position: Sitting, Cuff Size: Large)    Pulse 80    Temp (!) 96.9 F (36.1 C) (Temporal)    Ht 5\' 10"  (1.778 m)     Wt (!) 394 lb 3.2 oz (178.8 kg)    SpO2 97%    BMI 56.56 kg/m    Physical Exam Constitutional:      General: She is not in acute distress.    Appearance: She is obese. She is not ill-appearing, toxic-appearing or diaphoretic.  HENT:     Head: Normocephalic and atraumatic.     Right Ear: External ear normal.     Left Ear: External ear normal.     Nose: Nose normal.     Mouth/Throat:     Mouth: Mucous membranes are moist.  Eyes:     General: No scleral icterus.       Right eye: No discharge.        Left eye: No discharge.     Extraocular Movements: Extraocular movements intact.     Pupils: Pupils are equal, round, and reactive to light.  Cardiovascular:     Rate and Rhythm: Normal rate and regular rhythm.     Pulses: Normal pulses.     Heart sounds: Normal heart sounds. No murmur heard.  No friction rub. No gallop.   Pulmonary:     Effort: Pulmonary effort is normal. No respiratory distress.     Breath sounds: Normal breath sounds. No stridor. No wheezing, rhonchi or rales.  Chest:     Chest wall: No tenderness.  Abdominal:     Palpations: Abdomen is soft.  Skin:  General: Skin is warm.     Capillary Refill: Capillary refill takes less than 2 seconds.     Findings: Abscess and erythema present. No lesion.          Comments: Posterior lower forearm with 2 cm approximately sized skin lesion with erythema, mild induration, tender to the touch.  No visible drainage.  Neurological:     General: No focal deficit present.     Mental Status: She is oriented to person, place, and time.  Psychiatric:        Mood and Affect: Mood normal.        Behavior: Behavior normal.        Thought Content: Thought content normal.        Judgment: Judgment normal.       No results found for any visits on 06/17/20.  Assessment & Plan     Abscess of right arm  History of MRSA infection  Meds ordered this encounter  Medications   sulfamethoxazole-trimethoprim (BACTRIM DS)  800-160 MG tablet    Sig: Take 1 tablet by mouth 2 (two) times daily.    Dispense:  20 tablet    Refill:  0    Please let patinet know unable to prescribe linezoid follow up as needed.   Patient declines ambulatory referral to infectious disease given her repeat infections.  She would like to try treatment with Bactrim.  She declines any lab work today recommended CBC at least.  Patient declines Wound care instructions given and when to return the office strict return precautions were discussed.  After visit summary was given. Advised patient call the office or your primary care doctor for an appointment if no improvement within 72 hours or if any symptoms change or worsen at any time  Advised ER or urgent Care if after hours or on weekend. Call 911 for emergency symptoms at any time.Patinet verbalized understanding of all instructions given/reviewed and treatment plan and has no further questions or concerns at this time.     Return in about 2 weeks (around 07/01/2020), or if symptoms worsen or fail to improve, for at any time for any worsening symptoms, Go to Emergency room/ urgent care if worse.     IBeverely Pace Raliegh Scobie, FNP, have reviewed all documentation for this visit. The documentation on 06/20/20 for the exam, diagnosis, procedures, and orders are all accurate and complete.    Jairo Ben, FNP  [facility] [phone] (phone) [phone] (fax)  [facility]

## 2020-06-20 ENCOUNTER — Encounter: Payer: Self-pay | Admitting: Adult Health

## 2020-06-21 ENCOUNTER — Telehealth: Payer: Self-pay

## 2020-06-21 NOTE — Telephone Encounter (Signed)
Spoke with patient on the phone and triaged her more, she states that when she was in office she had swelling around her entire arm, she said she went home and took medication that was prescribed and applied hot compress she states by doing this routine has caused a boil to appear on her arm. Patient wanted to be seen in office today or have a new prescription sent in, I advised patient there was no openings and that you would not return to the office until morning. Patient was upset on phone stating that she has to miss work waiting on my call for a response, I apologized to patient, instructed her to continue medication as prescribed and will pass message to [facility] and provider who is in office today to review over office note. There is only one opening left in office 06/22/20 and that it was Adriana at [facility]. Renette Butters

## 2020-06-21 NOTE — Telephone Encounter (Signed)
Copied from CRM [id number]. Topic: General - Other >> Jun 21, 2020 12:02 PM Gwenlyn Fudge wrote: Reason for CRM: Pt called and is requesting to speak with PCP regarding the swelling she has been experiencing. She states that the swelling around it has gone down, but that it is red and has formed a knot. Please advise.

## 2020-06-21 NOTE — Telephone Encounter (Signed)
Left message for patient to call office back.Regina Snyder is out of office this afternoon and will not return back until tomorrow morning. Patient needs to be triaged, is redness something new? Or was is present before when seen at office visit 7/22? Knot that has formed is it under the skin? Is it tender to the touch? Does it move when pressed on? Any new injury occur since last visit? Has patient been running a fever? Okay for P[facility] to triage patient. KW

## 2020-06-21 NOTE — Telephone Encounter (Signed)
It is common for treatment to localize the infection as it seems to be doing, this is what we want it to do. Normally the area will begin to auto-drain or may be able to be lanced open.  This will require an in office evaluation. I would recommend to continue to antibiotic and warm compresses.

## 2020-06-22 ENCOUNTER — Ambulatory Visit: Payer: Self-pay

## 2020-06-22 NOTE — Telephone Encounter (Signed)
Relayed messages below to patient, she states that boil popped and drained this morning, PEC scheduled patient follow up appt with Jenni on 06/24/20 at 6:20PM. Renette Butters

## 2020-06-22 NOTE — Telephone Encounter (Signed)
Last night, abcess opened. Location is on the right forearm. Pt  noted it looked like a  "blood blister" and stated that the skin on top of the abscess was peeling.After the abscess opened pt express expressed 2 bits of "stuff" out of the abscess (looked greenish - white) Pt has h/o MRSA.  Redness has receeded around the area. Pt stated she is applying triple antibiotic ointment after applying warm compress twice daily.Drainage is  clear "gold tint" some blood noted.  Pt stated the edema she had previously is completely gone.  Denies fever and rash elsewhere.  During call, PCP called and call was ended. (see TE with today's date.) Pt is on po abx. Pt already scheduled an appt 06/24/20.  Reason for Disposition . Boil > 1/2 inch across (> 12 mm; larger than a marble)    Pt described the abscess as large but did not get dimensions  Answer Assessment - Initial Assessment Questions 1. APPEARANCE of BOIL: "What does the boil look like?"      redness 2. LOCATION: "Where is the boil located?"      Right forearm 3. NUMBER: "How many boils are there?"     1 4. SIZE: "How big is the boil?" (e.g., inches, cm; compare to size of a coin or other object)   Oversized bug bite slight edema, red not as red since popped 5. ONSET: "When did the boil start?"    2 weekends  blister x 2 water blisters scratched it and turned in to abscess.  6. PAIN: "Is there any pain?" If Yes, ask: "How bad is the pain?"   (Scale 1-10; or mild, moderate, severe)     no 7. FEVER: "Do you have a fever?" If Yes, ask: "What is it, how was it measured, and when did it start?"      no 8. SOURCE: "Have you been around anyone with boils or other Staph infections?" "Have you ever had boils before?"     H/o MRSA 9. OTHER SYMPTOMS: "Do you have any other symptoms?" (e.g., shaking chills, weakness, rash elsewhere on body)     no 10. PREGNANCY: "Is there any chance you are pregnant?" "When was your last menstrual period?"        n/a  Protocols used: BOIL (SKIN ABSCESS)-A-AH

## 2020-06-22 NOTE — Telephone Encounter (Signed)
Agree with Clarice Pole.  No further treatment needed. Bactrim should cover infection, if worsening will need follow up office visit for culture. Infectious disease was recommended given history patient did decline.

## 2020-06-22 NOTE — Telephone Encounter (Signed)
Ok

## 2020-06-24 ENCOUNTER — Ambulatory Visit: Payer: BC Managed Care – PPO | Admitting: Physician Assistant

## 2020-06-24 NOTE — Progress Notes (Deleted)
     Established patient visit   Patient: Regina Snyder   DOB: 18-Feb-1976   44 y.o. Female  MRN: [id number] Visit Date: 06/24/2020  Today's healthcare provider: [name], PA-C   No chief complaint on file.  Subjective    HPI  ***  {Show patient history (optional):23778::" "}   Medications: Outpatient Medications Prior to Visit  Medication Sig  . ibuprofen (ADVIL) 800 MG tablet Take 800 mg by mouth 3 (three) times daily.  . mupirocin ointment (BACTROBAN) 2 % Apply thin layer to area of concern as needed.  . sulfamethoxazole-trimethoprim (BACTRIM DS) 800-160 MG tablet Take 1 tablet by mouth 2 (two) times daily.   No facility-administered medications prior to visit.    Review of Systems  {Heme  Chem  Endocrine  Serology  Results Review (optional):23779::" "}  Objective    There were no vitals taken for this visit. {Show previous vital signs (optional):23777::" "}  Physical Exam  ***  No results found for any visits on 06/24/20.  Assessment & Plan     ***  No follow-ups on file.      {provider attestation***:1}   Reine Just  [facility] [phone] (phone) [phone] (fax)  [facility]

## 2020-06-29 ENCOUNTER — Ambulatory Visit: Payer: BC Managed Care – PPO | Admitting: Adult Health

## 2020-06-29 ENCOUNTER — Other Ambulatory Visit: Payer: Self-pay

## 2020-06-29 ENCOUNTER — Encounter: Payer: Self-pay | Admitting: Adult Health

## 2020-06-29 VITALS — BP 116/68 | HR 85 | Temp 98.1°F | Wt 391.0 lb

## 2020-06-29 DIAGNOSIS — Z7689 Persons encountering health services in other specified circumstances: Secondary | ICD-10-CM

## 2020-06-29 DIAGNOSIS — L02413 Cutaneous abscess of right upper limb: Secondary | ICD-10-CM

## 2020-06-29 MED ORDER — CEPHALEXIN 500 MG PO CAPS: 500.0000 mg | ORAL_CAPSULE | Freq: Four times a day (QID) | ORAL | 0 refills | Status: DC

## 2020-06-29 MED ORDER — SULFAMETHOXAZOLE-TRIMETHOPRIM 800-160 MG PO TABS: 1.0000 | ORAL_TABLET | Freq: Two times a day (BID) | ORAL | 0 refills | Status: DC

## 2020-06-29 NOTE — Patient Instructions (Addendum)
Cephalexin Tablets or Capsules What is this medicine? CEPHALEXIN (sef a LEX in) is a cephalosporin antibiotic. It treats some infections caused by bacteria. It will not work for colds, the flu, or other viruses. This medicine may be used for other purposes; ask your health care provider or pharmacist if you have questions. COMMON BRAND NAME(S): Biocef, Daxbia, Keflex, Keftab What should I tell my health care provider before I take this medicine? They need to know if you have any of these conditions:  kidney disease  stomach or intestine problems, especially colitis  an unusual or allergic reaction to cephalexin, other cephalosporins, penicillins, other antibiotics, medicines, foods, dyes or preservatives  pregnant or trying to get pregnant  breast-feeding How should I use this medicine? Take this drug by mouth. Take it as directed on the prescription label at the same time every day. You can take it with or without food. If it upsets your stomach, take it with food. Take all of this drug unless your health care provider tells you to stop it early. Keep taking it even if you think you are better. Talk to your health care provider about the use of this drug in children. While it may be prescribed for selected conditions, precautions do apply. Overdosage: If you think you have taken too much of this medicine contact a poison control center or emergency room at once. NOTE: This medicine is only for you. Do not share this medicine with others. What if I miss a dose? If you miss a dose, take it as soon as you can. If it is almost time for your next dose, take only that dose. Do not take double or extra doses. What may interact with this medicine?  probenecid  some other antibiotics This list may not describe all possible interactions. Give your health care provider a list of all the medicines, herbs, non-prescription drugs, or dietary supplements you use. Also tell them if you smoke, drink  alcohol, or use illegal drugs. Some items may interact with your medicine. What should I watch for while using this medicine? Tell your doctor or health care provider if your symptoms do not begin to improve in a few days. This medicine may cause serious skin reactions. They can happen weeks to months after starting the medicine. Contact your health care provider right away if you notice fevers or flu-like symptoms with a rash. The rash may be red or purple and then turn into blisters or peeling of the skin. Or, you might notice a red rash with swelling of the face, lips or lymph nodes in your neck or under your arms. Do not treat diarrhea with over the counter products. Contact your doctor if you have diarrhea that lasts more than 2 days or if it is severe and watery. If you have diabetes, you may get a false-positive result for sugar in your urine. Check with your doctor or health care provider. What side effects may I notice from receiving this medicine? Side effects that you should report to your doctor or health care professional as soon as possible:  allergic reactions like skin rash, itching or hives, swelling of the face, lips, or tongue  breathing problems  pain or trouble passing urine  redness, blistering, peeling or loosening of the skin, including inside the mouth  severe or watery diarrhea  unusually weak or tired  yellowing of the eyes, skin Side effects that usually do not require medical attention (report to your doctor or health care professional   if they continue or are bothersome):  gas or heartburn  genital or anal irritation  headache  joint or muscle pain  nausea, vomiting This list may not describe all possible side effects. Call your doctor for medical advice about side effects. You may report side effects to FDA at 1-800-FDA-1088. Where should I keep my medicine? Keep out of the reach of children and pets. Store at room temperature between 20 and 25  degrees C (68 and 77 degrees F). Throw away any unused drug after the expiration date. NOTE: This sheet is a summary. It may not cover all possible information. If you have questions about this medicine, talk to your doctor, pharmacist, or health care provider.  2020 Elsevier/Gold Standard (2019-06-20 11:27:00) Sulfamethoxazole; Trimethoprim, SMX-TMP tablets What is this medicine? SULFAMETHOXAZOLE; TRIMETHOPRIM or SMX-TMP (suhl fuh meth OK suh zohl; trye METH oh prim) is a combination of a sulfonamide antibiotic and a second antibiotic, trimethoprim. It is used to treat or prevent certain kinds of bacterial infections. It will not work for colds, flu, or other viral infections. This medicine may be used for other purposes; ask your health care provider or pharmacist if you have questions. COMMON BRAND NAME(S): Bacter-Aid DS, Bactrim, Bactrim DS, Septra, Septra DS What should I tell my health care provider before I take this medicine? They need to know if you have any of these conditions:  G6PD deficiency  HIV or AIDS  kidney disease  liver disease  low platelets  low red blood cell counts  poor nutrition  stomach or intestine problems like colitis  thyroid disease  an unusual or allergic reaction to sulfamethoxazole, trimethoprim, sulfa drugs, other drugs, foods, dyes, or preservatives  pregnant or trying to get pregnant  breast-feeding How should I use this medicine? Take this medicine by mouth with a glass of water. Follow the directions on the prescription label. Take your medicine at regular intervals. Do not take it more often than directed. Take all of your medicine as directed even if you think you are better. Do not skip doses or stop your medicine early. Talk to your pediatrician regarding the use of this medicine in children. While this drug may be prescribed for children as young as 2 months for selected conditions, precautions do apply. Overdosage: If you think you  have taken too much of this medicine contact a poison control center or emergency room at once. NOTE: This medicine is only for you. Do not share this medicine with others. What if I miss a dose? If you miss a dose, take it as soon as you can. If it is almost time for your next dose, take only that dose. Do not take double or extra doses. What may interact with this medicine? Do not take this medicine with any of the following medications:  dofetilide This medicine may also interact with the following medications:  amantadine  birth control pills  certain medicines for blood pressure, heart disease  certain medicines for depression, like amitriptyline  certain medicines for diabetes, like glipizide or glyburide  certain medicines that treat or prevent blood clots like warfarin  cyclosporine  digoxin  diuretics  indomethacin  methotrexate  phenytoin  procainamide  pyrimethamine  zidovudine This list may not describe all possible interactions. Give your health care provider a list of all the medicines, herbs, non-prescription drugs, or dietary supplements you use. Also tell them if you smoke, drink alcohol, or use illegal drugs. Some items may interact with your medicine. What should  I watch for while using this medicine? Tell your health care provider if your symptoms do not start to get better or if they get worse. Do not treat diarrhea with over the counter products. Contact your health care provider if you have diarrhea that lasts more than 2 days or if it is severe and watery. This drug may cause serious skin reactions. They can happen weeks to months after starting the drug. Contact your health care provider right away if you notice fevers or flu-like symptoms with a rash. The rash may be red or purple and then turn into blisters or peeling of the skin. Or, you might notice a red rash with swelling of the face, lips or lymph nodes in your neck or under your arms. This  drug can make you more sensitive to the sun. Keep out of the sun. If you cannot avoid being in the sun, wear protective clothing and sunscreen. Do not use sun lamps or tanning beds/booths. Be careful brushing or flossing your teeth or using a toothpick because you may get an infection or bleed more easily. If you have any dental work done, tell your dentist you are receiving this drug. What side effects may I notice from receiving this medicine? Side effects that you should report to your doctor or health care professional as soon as possible:  allergic reactions (skin rash, itching or hives; swelling of the face, lips, or tongue)  bloody or watery diarrhea  heartbeat rhythm changes (trouble breathing; chest pain; dizziness; fast, irregular heartbeat; feeling faint or lightheaded, falls,)  fever  high potassium levels (chest pain; fast, irregular heartbeat; muscle weakness)  kidney injury (trouble passing urine or change in the amount of urine)  low blood sugar (feeling anxious; confusion; dizziness; increased hunger; unusually weak or tired; increased sweating; shakiness; cold, clammy skin; irritable; headache; blurred vision; fast heartbeat; loss of consciousness)  low red blood cell counts (trouble breathing; feeling faint; lightheaded, falls; unusually weak or tired)  rash, fever, and swollen lymph nodes  redness, blistering, peeling, or loosening of the skin, including inside the mouth  unusual bruising or bleeding Side effects that usually do not require medical attention (report to your doctor or health care professional if they continue or are bothersome):  loss of appetite  nausea  vomiting This list may not describe all possible side effects. Call your doctor for medical advice about side effects. You may report side effects to FDA at 1-800-FDA-1088. Where should I keep my medicine? Keep out of the reach of children. Store between 15 and 25 degrees C (59 to 77 degrees  F). Protect from light. Keep the container tightly closed. Throw away any unused drug after the expiration date. NOTE: This sheet is a summary. It may not cover all possible information. If you have questions about this medicine, talk to your doctor, pharmacist, or health care provider.  2020 Elsevier/Gold Standard (2019-08-01 12:53:51) Cephalexin Tablets or Capsules What is this medicine? CEPHALEXIN (sef a LEX in) is a cephalosporin antibiotic. It treats some infections caused by bacteria. It will not work for colds, the flu, or other viruses. This medicine may be used for other purposes; ask your health care provider or pharmacist if you have questions. COMMON BRAND NAME(S): Biocef, Daxbia, Keflex, Keftab What should I tell my health care provider before I take this medicine? They need to know if you have any of these conditions:  kidney disease  stomach or intestine problems, especially colitis  an unusual or allergic  reaction to cephalexin, other cephalosporins, penicillins, other antibiotics, medicines, foods, dyes or preservatives  pregnant or trying to get pregnant  breast-feeding How should I use this medicine? Take this drug by mouth. Take it as directed on the prescription label at the same time every day. You can take it with or without food. If it upsets your stomach, take it with food. Take all of this drug unless your health care provider tells you to stop it early. Keep taking it even if you think you are better. Talk to your health care provider about the use of this drug in children. While it may be prescribed for selected conditions, precautions do apply. Overdosage: If you think you have taken too much of this medicine contact a poison control center or emergency room at once. NOTE: This medicine is only for you. Do not share this medicine with others. What if I miss a dose? If you miss a dose, take it as soon as you can. If it is almost time for your next dose, take  only that dose. Do not take double or extra doses. What may interact with this medicine?  probenecid  some other antibiotics This list may not describe all possible interactions. Give your health care provider a list of all the medicines, herbs, non-prescription drugs, or dietary supplements you use. Also tell them if you smoke, drink alcohol, or use illegal drugs. Some items may interact with your medicine. What should I watch for while using this medicine? Tell your doctor or health care provider if your symptoms do not begin to improve in a few days. This medicine may cause serious skin reactions. They can happen weeks to months after starting the medicine. Contact your health care provider right away if you notice fevers or flu-like symptoms with a rash. The rash may be red or purple and then turn into blisters or peeling of the skin. Or, you might notice a red rash with swelling of the face, lips or lymph nodes in your neck or under your arms. Do not treat diarrhea with over the counter products. Contact your doctor if you have diarrhea that lasts more than 2 days or if it is severe and watery. If you have diabetes, you may get a false-positive result for sugar in your urine. Check with your doctor or health care provider. What side effects may I notice from receiving this medicine? Side effects that you should report to your doctor or health care professional as soon as possible:  allergic reactions like skin rash, itching or hives, swelling of the face, lips, or tongue  breathing problems  pain or trouble passing urine  redness, blistering, peeling or loosening of the skin, including inside the mouth  severe or watery diarrhea  unusually weak or tired  yellowing of the eyes, skin Side effects that usually do not require medical attention (report to your doctor or health care professional if they continue or are bothersome):  gas or heartburn  genital or anal  irritation  headache  joint or muscle pain  nausea, vomiting This list may not describe all possible side effects. Call your doctor for medical advice about side effects. You may report side effects to FDA at 1-800-FDA-1088. Where should I keep my medicine? Keep out of the reach of children and pets. Store at room temperature between 20 and 25 degrees C (68 and 77 degrees F). Throw away any unused drug after the expiration date. NOTE: This sheet is a summary. It may  not cover all possible information. If you have questions about this medicine, talk to your doctor, pharmacist, or health care provider.  2020 Elsevier/Gold Standard (2019-06-20 11:27:00)  Wound Care, Adult Taking care of your wound properly can help to prevent pain, infection, and scarring. It can also help your wound to heal more quickly. How to care for your wound Wound care      Follow instructions from your health care provider about how to take care of your wound. Make sure you: ? Wash your hands with soap and water before you change the bandage (dressing). If soap and water are not available, use hand sanitizer. ? Change your dressing as told by your health care provider. ? Leave stitches (sutures), skin glue, or adhesive strips in place. These skin closures may need to stay in place for 2 weeks or longer. If adhesive strip edges start to loosen and curl up, you may trim the loose edges. Do not remove adhesive strips completely unless your health care provider tells you to do that.  Check your wound area every day for signs of infection. Check for: ? Redness, swelling, or pain. ? Fluid or blood. ? Warmth. ? Pus or a bad smell.  Ask your health care provider if you should clean the wound with mild soap and water. Doing this may include: ? Using a clean towel to pat the wound dry after cleaning it. Do not rub or scrub the wound. ? Applying a cream or ointment. Do this only as told by your health care  provider. ? Covering the incision with a clean dressing.  Ask your health care provider when you can leave the wound uncovered.  Keep the dressing dry until your health care provider says it can be removed. Do not take baths, swim, use a hot tub, or do anything that would put the wound underwater until your health care provider approves. Ask your health care provider if you can take showers. You may only be allowed to take sponge baths. Medicines   If you were prescribed an antibiotic medicine, cream, or ointment, take or use the antibiotic as told by your health care provider. Do not stop taking or using the antibiotic even if your condition improves.  Take over-the-counter and prescription medicines only as told by your health care provider. If you were prescribed pain medicine, take it 30 or more minutes before you do any wound care or as told by your health care provider. General instructions  Return to your normal activities as told by your health care provider. Ask your health care provider what activities are safe.  Do not scratch or pick at the wound.  Do not use any products that contain nicotine or tobacco, such as cigarettes and e-cigarettes. These may delay wound healing. If you need help quitting, ask your health care provider.  Keep all follow-up visits as told by your health care provider. This is important.  Eat a diet that includes protein, vitamin A, vitamin C, and other nutrient-rich foods to help the wound heal. ? Foods rich in protein include meat, dairy, beans, nuts, and other sources. ? Foods rich in vitamin A include carrots and dark green, leafy vegetables. ? Foods rich in vitamin C include citrus, tomatoes, and other fruits and vegetables. ? Nutrient-rich foods have protein, carbohydrates, fat, vitamins, or minerals. Eat a variety of healthy foods including vegetables, fruits, and whole grains. Contact a health care provider if:  You received a tetanus shot and  you have  swelling, severe pain, redness, or bleeding at the injection site.  Your pain is not controlled with medicine.  You have redness, swelling, or pain around the wound.  You have fluid or blood coming from the wound.  Your wound feels warm to the touch.  You have pus or a bad smell coming from the wound.  You have a fever or chills.  You are nauseous or you vomit.  You are dizzy. Get help right away if:  You have a red streak going away from your wound.  The edges of the wound open up and separate.  Your wound is bleeding, and the bleeding does not stop with gentle pressure.  You have a rash.  You faint.  You have trouble breathing. Summary  Always wash your hands with soap and water before changing your bandage (dressing).  To help with healing, eat foods that are rich in protein, vitamin A, vitamin C, and other nutrients.  Check your wound every day for signs of infection. Contact your health care provider if you suspect that your wound is infected. This information is not intended to replace advice given to you by your health care provider. Make sure you discuss any questions you have with your health care provider. Document Revised: 03/03/2019 Document Reviewed: 05/30/2016 Elsevier Patient Education  2020 [employer]

## 2020-06-29 NOTE — Progress Notes (Signed)
Established patient visit   Patient: Regina Snyder   DOB: [birth year]   44 y.o. Female  MRN: [id number] Visit Date: 06/29/2020  Today's healthcare provider: Jairo Ben, FNP   Chief Complaint  Patient presents with  . Abscess   Subjective    Abscess This is a recurrent problem. The problem has been gradually improving. Pertinent negatives include no headaches or rash. Treatments tried: Bactrim DS. The treatment provided mild relief.    Patient still feels a the area is larger underneath her skin, area has been draining yellow pus. She completed Bactrim 3 days ago. She has not been using warm compresses.   She did not go for labs.   Patient  denies any fever, body aches,chills, rash, chest pain, shortness of breath, nausea, vomiting, or diarrhea.  Denies dizziness, lightheadedness, pre syncopal or syncopal episodes.    Patient Active Problem List   Diagnosis Date Noted  . Sprain of knee 05/03/2020  . Morbid obesity (HCC) 04/29/2020  . Body mass index (BMI) of 50-59.9 in adult (HCC) 04/29/2020  . Venous insufficiency 04/29/2020  . History of MRSA infection 04/29/2020  . Varicose veins of both lower extremities with inflammation 04/29/2020  . Skin irritation 04/29/2020  . Encounter for incision and drainage procedure 04/29/2020  . Pain in both lower extremities 04/29/2020  . Cellulitis of left lower extremity 04/29/2020  . Abdominal wall cellulitis 03/18/2020  . Abscess of abdominal wall 03/18/2020  . Abscess of right arm 03/18/2020   Past Medical History:  Diagnosis Date  . Allergy    Social History   Tobacco Use  . Smoking status: Never Smoker  . Smokeless tobacco: Never Used  Substance Use Topics  . Alcohol use: Never  . Drug use: Never   Allergies  Allergen Reactions  . Azithromycin Other (See Comments)  . Codeine Nausea Only and Nausea And Vomiting  . Doxycycline Rash     Medications: Outpatient Medications Prior to Visit  Medication  Sig  . [employer] ANTISEPTIC SKIN CLEANSER 4 % SOLN Apply topically daily.  Marland Kitchen ibuprofen (ADVIL) 800 MG tablet Take 800 mg by mouth 3 (three) times daily.  . mupirocin ointment (BACTROBAN) 2 % Apply thin layer to area of concern as needed.  . [DISCONTINUED] sulfamethoxazole-trimethoprim (BACTRIM DS) 800-160 MG tablet Take 1 tablet by mouth 2 (two) times daily. (Patient not taking: Reported on 06/29/2020)   No facility-administered medications prior to visit.    Review of Systems  Constitutional: Negative.   Respiratory: Negative.   Cardiovascular: Negative.   Gastrointestinal: Negative.   Musculoskeletal: Negative.  Negative for neck stiffness.  Skin: Positive for wound (right arm. ). Negative for color change, pallor and rash.  Neurological: Negative for dizziness, light-headedness and headaches.      Objective    BP 116/68 (BP Location: Left Wrist, Patient Position: Sitting, Cuff Size: Large)   Pulse 85   Temp 98.1 F (36.7 C) (Oral)   Wt (!) 391 lb (177.4 kg)   SpO2 98%   BMI 56.10 kg/m    Physical Exam Vitals reviewed.  Constitutional:      General: She is not in acute distress.    Appearance: Normal appearance. She is obese. She is not ill-appearing, toxic-appearing or diaphoretic.     Comments: Patient is alert and oriented and responsive to questions Engages in eye contact with provider. Speaks in full sentences without any pauses without any shortness of breath or distress.    HENT:  Head: Normocephalic and atraumatic.     Right Ear: External ear normal.     Left Ear: External ear normal.     Nose: Nose normal.  Eyes:     Conjunctiva/sclera: Conjunctivae normal.  Cardiovascular:     Rate and Rhythm: Normal rate and regular rhythm.     Pulses: Normal pulses.     Heart sounds: Normal heart sounds. No murmur heard.  No friction rub. No gallop.   Pulmonary:     Effort: Pulmonary effort is normal. No respiratory distress.     Breath sounds: Normal breath sounds. No  stridor. No wheezing, rhonchi or rales.  Chest:     Chest wall: No tenderness.  Abdominal:     General: There is no distension.     Palpations: Abdomen is soft.     Tenderness: There is no abdominal tenderness.  Musculoskeletal:        General: Normal range of motion.     Cervical back: Normal range of motion and neck supple. No rigidity or tenderness.  Lymphadenopathy:     Cervical: No cervical adenopathy.  Skin:    General: Skin is warm.     Capillary Refill: Capillary refill takes less than 2 seconds.     Findings: Abscess and erythema present.          Comments: I & D :  Diagnosis: abscess - Location: Right anterior lower arm.  Procedure: Incision & drainage Informed consent:  Discussed risks, worsening infection, pain, bleeding, bruising, numbness, and recurrence of the condition) and benefits of the procedure, as well as the alternatives.  Informed consent was obtained. Anesthesia: lidocaine with epi  Injected 0.5 ml  In abscess after cleansing wound area and beyond with betadine.  Used # 11 blade to make puncture in center of abscess x 1. Abscess was expressed manually.   The area was prepared and draped in a standard fashion. The lesion drained pus and blood. A moderate  amount of fluid was drained. Hemostasis was obtained. Cleaned area with wound cleanser and applied gauze bandage.  The patient tolerated the procedure well. The patient was instructed on post-op care.   Neurological:     Mental Status: She is alert and oriented to person, place, and time.  Psychiatric:        Mood and Affect: Mood normal.        Behavior: Behavior normal.        Thought Content: Thought content normal.        Judgment: Judgment normal.       No results found for any visits on 06/29/20.  Assessment & Plan     Abscess of right arm - Plan: Aerobic culture, sulfamethoxazole-trimethoprim (BACTRIM DS) 800-160 MG tablet, cephALEXin (KEFLEX) 500 MG capsule, CBC with  Differential/Platelet, Comprehensive Metabolic Panel (CMET)  Encounter for incision and drainage procedure  Will add on 5 more days of Bactrim given patient history of MRSA, sent for culture. Also adding Keflex to cover strep/ staph.   .wound care instructions given. Warm compresses advised.   Labs advised as at last visit.  Orders Placed This Encounter  Procedures  . Aerobic culture    Order Specific Question:   Source    Answer:   wound right arm  . CBC with Differential/Platelet  . Comprehensive Metabolic Panel (CMET)    Meds ordered this encounter  Medications  . sulfamethoxazole-trimethoprim (BACTRIM DS) 800-160 MG tablet    Sig: Take 1 tablet by mouth 2 (two) times daily.  Dispense:  10 tablet    Refill:  0    Please let patinet know unable to prescribe linezoid follow up as needed.  . cephALEXin (KEFLEX) 500 MG capsule    Sig: Take 1 capsule (500 mg total) by mouth 4 (four) times daily.    Dispense:  40 capsule    Refill:  0   Return in about 1 week (around 07/06/2020), or if symptoms worsen or fail to improve, for at any time for any worsening symptoms, Go to Emergency room/ urgent care if worse.      IBeverely Pace Ghadeer Kastelic, FNP, have reviewed all documentation for this visit. The documentation on 06/30/20 for the exam, diagnosis, procedures, and orders are all accurate and complete.    Jairo Ben, FNP  [facility] [phone] (phone) [phone] (fax)  [facility]

## 2020-06-30 ENCOUNTER — Encounter: Payer: Self-pay | Admitting: Adult Health

## 2020-06-30 MED ORDER — LIDOCAINE-EPINEPHRINE 1 %-1:100000 IJ SOLN
1.0000 mL | Freq: Once | INTRAMUSCULAR | Status: AC
Start: 2020-06-30 — End: 2020-06-30
  Administered 2020-06-30: 1 mL

## 2020-06-30 NOTE — Addendum Note (Signed)
Addended by: Kavin Leech E on: 06/30/2020 08:52 AM   Modules accepted: Orders

## 2020-07-05 LAB — AEROBIC CULTURE

## 2020-07-06 ENCOUNTER — Telehealth: Payer: Self-pay

## 2020-07-06 NOTE — Telephone Encounter (Signed)
Copied from CRM [id number]. Topic: Quick Communication - Lab Results (Clinic Use ONLY) >> Jul 06, 2020  9:05 AM Regina Snyder wrote: Pt would like a call back to discuss the results of her culture. It was released to mychart, but nothing noted.please call back

## 2020-07-07 ENCOUNTER — Telehealth: Payer: Self-pay

## 2020-07-07 NOTE — Telephone Encounter (Signed)
LMTCB, okay for [facility] triage to advise patient of results of urine culture. KW  Flinchum, Eula Fried, FNP  Fonda Kinder, CMA  No bacterial growth in culture. Continue treatment as advised at last office visit and return as needed/ discussed at visit.

## 2020-07-07 NOTE — Telephone Encounter (Signed)
She doesn't need to take both antibiotics.  Fine to stop Keflex and finish Bactrim only

## 2020-07-07 NOTE — Telephone Encounter (Signed)
See telephone encounter from 07/07/20. KW

## 2020-07-07 NOTE — Telephone Encounter (Signed)
Reviewed results and provider's note with the patient.  Patient reports having migraines when she takes both the Keflex and Bactrim together. Advised she increase water intake while on these medications. She request to complete the Bactrim DS then complete the Keflex. She will increase her water intake and report back if any further concerns. Routing to provider for any additional advice.

## 2020-07-08 NOTE — Telephone Encounter (Signed)
Patient advised as below. Patient verbalizes understanding and is in agreement with treatment plan.  

## 2020-07-09 ENCOUNTER — Ambulatory Visit: Payer: BC Managed Care – PPO | Admitting: Adult Health

## 2020-08-10 ENCOUNTER — Telehealth: Payer: Self-pay

## 2020-08-10 NOTE — Telephone Encounter (Signed)
Patient reports COVID test done else where.

## 2020-08-10 NOTE — Telephone Encounter (Signed)
Copied from CRM [id number]. Topic: Appointment Scheduling - Scheduling Inquiry for Clinic >> Aug 10, 2020  8:52 AM Leafy Ro wrote: Reason for CRM:  pt would like to have covid test. Pt has a cough and headache

## 2020-08-10 NOTE — Telephone Encounter (Signed)
Okay to order?  Thanks,   -Avik Leoni  

## 2020-08-10 NOTE — Telephone Encounter (Signed)
Ok to order and give instructions to drive up this afternoon

## 2020-08-30 ENCOUNTER — Other Ambulatory Visit: Payer: Self-pay

## 2020-08-30 ENCOUNTER — Ambulatory Visit (INDEPENDENT_AMBULATORY_CARE_PROVIDER_SITE_OTHER): Payer: BC Managed Care – PPO | Admitting: Adult Health

## 2020-08-30 ENCOUNTER — Encounter: Payer: Self-pay | Admitting: Adult Health

## 2020-08-30 VITALS — BP 134/88 | HR 85 | Temp 98.4°F | Resp 16 | Ht 70.0 in | Wt 385.0 lb

## 2020-08-30 DIAGNOSIS — Z8614 Personal history of Methicillin resistant Staphylococcus aureus infection: Secondary | ICD-10-CM

## 2020-08-30 DIAGNOSIS — R238 Other skin changes: Secondary | ICD-10-CM | POA: Diagnosis not present

## 2020-08-30 DIAGNOSIS — L089 Local infection of the skin and subcutaneous tissue, unspecified: Secondary | ICD-10-CM | POA: Diagnosis not present

## 2020-08-30 HISTORY — DX: Local infection of the skin and subcutaneous tissue, unspecified: L08.9

## 2020-08-30 MED ORDER — CVS ANTISEPTIC SKIN CLEANSER 4 % EX SOLN: 1.0000 | Freq: Every day | CUTANEOUS | 1 refills | Status: DC

## 2020-08-30 MED ORDER — CEPHALEXIN 500 MG PO CAPS: 500.0000 mg | ORAL_CAPSULE | Freq: Three times a day (TID) | ORAL | 0 refills | Status: DC

## 2020-08-30 MED ORDER — SULFAMETHOXAZOLE-TRIMETHOPRIM 800-160 MG PO TABS: 1.0000 | ORAL_TABLET | Freq: Two times a day (BID) | ORAL | 0 refills | Status: DC

## 2020-08-30 NOTE — Patient Instructions (Signed)
Cephalexin Tablets or Capsules What is this medicine? CEPHALEXIN (sef a LEX in) is a cephalosporin antibiotic. It treats some infections caused by bacteria. It will not work for colds, the flu, or other viruses. This medicine may be used for other purposes; ask your health care provider or pharmacist if you have questions. COMMON BRAND NAME(S): Biocef, Daxbia, Keflex, Keftab What should I tell my health care provider before I take this medicine? They need to know if you have any of these conditions:  kidney disease  stomach or intestine problems, especially colitis  an unusual or allergic reaction to cephalexin, other cephalosporins, penicillins, other antibiotics, medicines, foods, dyes or preservatives  pregnant or trying to get pregnant  breast-feeding How should I use this medicine? Take this drug by mouth. Take it as directed on the prescription label at the same time every day. You can take it with or without food. If it upsets your stomach, take it with food. Take all of this drug unless your health care provider tells you to stop it early. Keep taking it even if you think you are better. Talk to your health care provider about the use of this drug in children. While it may be prescribed for selected conditions, precautions do apply. Overdosage: If you think you have taken too much of this medicine contact a poison control center or emergency room at once. NOTE: This medicine is only for you. Do not share this medicine with others. What if I miss a dose? If you miss a dose, take it as soon as you can. If it is almost time for your next dose, take only that dose. Do not take double or extra doses. What may interact with this medicine?  probenecid  some other antibiotics This list may not describe all possible interactions. Give your health care provider a list of all the medicines, herbs, non-prescription drugs, or dietary supplements you use. Also tell them if you smoke, drink  alcohol, or use illegal drugs. Some items may interact with your medicine. What should I watch for while using this medicine? Tell your doctor or health care provider if your symptoms do not begin to improve in a few days. This medicine may cause serious skin reactions. They can happen weeks to months after starting the medicine. Contact your health care provider right away if you notice fevers or flu-like symptoms with a rash. The rash may be red or purple and then turn into blisters or peeling of the skin. Or, you might notice a red rash with swelling of the face, lips or lymph nodes in your neck or under your arms. Do not treat diarrhea with over the counter products. Contact your doctor if you have diarrhea that lasts more than 2 days or if it is severe and watery. If you have diabetes, you may get a false-positive result for sugar in your urine. Check with your doctor or health care provider. What side effects may I notice from receiving this medicine? Side effects that you should report to your doctor or health care professional as soon as possible:  allergic reactions like skin rash, itching or hives, swelling of the face, lips, or tongue  breathing problems  pain or trouble passing urine  redness, blistering, peeling or loosening of the skin, including inside the mouth  severe or watery diarrhea  unusually weak or tired  yellowing of the eyes, skin Side effects that usually do not require medical attention (report to your doctor or health care professional   if they continue or are bothersome):  gas or heartburn  genital or anal irritation  headache  joint or muscle pain  nausea, vomiting This list may not describe all possible side effects. Call your doctor for medical advice about side effects. You may report side effects to FDA at 1-800-FDA-1088. Where should I keep my medicine? Keep out of the reach of children and pets. Store at room temperature between 20 and 25  degrees C (68 and 77 degrees F). Throw away any unused drug after the expiration date. NOTE: This sheet is a summary. It may not cover all possible information. If you have questions about this medicine, talk to your doctor, pharmacist, or health care provider.  2020 Elsevier/Gold Standard (2019-06-20 11:27:00) Sulfamethoxazole; Trimethoprim, SMX-TMP tablets What is this medicine? SULFAMETHOXAZOLE; TRIMETHOPRIM or SMX-TMP (suhl fuh meth OK suh zohl; trye METH oh prim) is a combination of a sulfonamide antibiotic and a second antibiotic, trimethoprim. It is used to treat or prevent certain kinds of bacterial infections. It will not work for colds, flu, or other viral infections. This medicine may be used for other purposes; ask your health care provider or pharmacist if you have questions. COMMON BRAND NAME(S): Bacter-Aid DS, Bactrim, Bactrim DS, Septra, Septra DS What should I tell my health care provider before I take this medicine? They need to know if you have any of these conditions:  G6PD deficiency  HIV or AIDS  kidney disease  liver disease  low platelets  low red blood cell counts  poor nutrition  stomach or intestine problems like colitis  thyroid disease  an unusual or allergic reaction to sulfamethoxazole, trimethoprim, sulfa drugs, other drugs, foods, dyes, or preservatives  pregnant or trying to get pregnant  breast-feeding How should I use this medicine? Take this medicine by mouth with a glass of water. Follow the directions on the prescription label. Take your medicine at regular intervals. Do not take it more often than directed. Take all of your medicine as directed even if you think you are better. Do not skip doses or stop your medicine early. Talk to your pediatrician regarding the use of this medicine in children. While this drug may be prescribed for children as young as 2 months for selected conditions, precautions do apply. Overdosage: If you think you  have taken too much of this medicine contact a poison control center or emergency room at once. NOTE: This medicine is only for you. Do not share this medicine with others. What if I miss a dose? If you miss a dose, take it as soon as you can. If it is almost time for your next dose, take only that dose. Do not take double or extra doses. What may interact with this medicine? Do not take this medicine with any of the following medications:  dofetilide This medicine may also interact with the following medications:  amantadine  birth control pills  certain medicines for blood pressure, heart disease  certain medicines for depression, like amitriptyline  certain medicines for diabetes, like glipizide or glyburide  certain medicines that treat or prevent blood clots like warfarin  cyclosporine  digoxin  diuretics  indomethacin  methotrexate  phenytoin  procainamide  pyrimethamine  zidovudine This list may not describe all possible interactions. Give your health care provider a list of all the medicines, herbs, non-prescription drugs, or dietary supplements you use. Also tell them if you smoke, drink alcohol, or use illegal drugs. Some items may interact with your medicine. What should  I watch for while using this medicine? Tell your health care provider if your symptoms do not start to get better or if they get worse. Do not treat diarrhea with over the counter products. Contact your health care provider if you have diarrhea that lasts more than 2 days or if it is severe and watery. This drug may cause serious skin reactions. They can happen weeks to months after starting the drug. Contact your health care provider right away if you notice fevers or flu-like symptoms with a rash. The rash may be red or purple and then turn into blisters or peeling of the skin. Or, you might notice a red rash with swelling of the face, lips or lymph nodes in your neck or under your arms. This  drug can make you more sensitive to the sun. Keep out of the sun. If you cannot avoid being in the sun, wear protective clothing and sunscreen. Do not use sun lamps or tanning beds/booths. Be careful brushing or flossing your teeth or using a toothpick because you may get an infection or bleed more easily. If you have any dental work done, tell your dentist you are receiving this drug. What side effects may I notice from receiving this medicine? Side effects that you should report to your doctor or health care professional as soon as possible:  allergic reactions (skin rash, itching or hives; swelling of the face, lips, or tongue)  bloody or watery diarrhea  heartbeat rhythm changes (trouble breathing; chest pain; dizziness; fast, irregular heartbeat; feeling faint or lightheaded, falls,)  fever  high potassium levels (chest pain; fast, irregular heartbeat; muscle weakness)  kidney injury (trouble passing urine or change in the amount of urine)  low blood sugar (feeling anxious; confusion; dizziness; increased hunger; unusually weak or tired; increased sweating; shakiness; cold, clammy skin; irritable; headache; blurred vision; fast heartbeat; loss of consciousness)  low red blood cell counts (trouble breathing; feeling faint; lightheaded, falls; unusually weak or tired)  rash, fever, and swollen lymph nodes  redness, blistering, peeling, or loosening of the skin, including inside the mouth  unusual bruising or bleeding Side effects that usually do not require medical attention (report to your doctor or health care professional if they continue or are bothersome):  loss of appetite  nausea  vomiting This list may not describe all possible side effects. Call your doctor for medical advice about side effects. You may report side effects to FDA at 1-800-FDA-1088. Where should I keep my medicine? Keep out of the reach of children. Store between 15 and 25 degrees C (59 to 77 degrees  F). Protect from light. Keep the container tightly closed. Throw away any unused drug after the expiration date. NOTE: This sheet is a summary. It may not cover all possible information. If you have questions about this medicine, talk to your doctor, pharmacist, or health care provider.  2020 Elsevier/Gold Standard (2019-08-01 12:53:51)

## 2020-08-30 NOTE — Progress Notes (Signed)
Established patient visit   Patient: Regina Snyder   DOB: 1976/03/27   44 y.o. Female  MRN: [id number] Visit Date: 08/30/2020  Today's healthcare provider: Jairo Ben, FNP   Chief Complaint  Patient presents with   Rash   Subjective    HPI  Patient presents today with spots all over her abdomen and upper back. She reports that she has had symptoms for about 3 days. She also reports that she has a "knot" under her right arm about the size of a pea.   She reports some of the spots on her body have been present x 2 weeks and she has one on her abdomen came up last night.    Patient  denies any fever, body aches,chills, chest pain, shortness of breath, nausea, vomiting, or diarrhea.   She feels she may have some insects/ spiders in her bedroom as she wakes up with these spots.   She had some keflex for 2- 3 days and started taking it and it seems to improved.  She has a history of MRSA.  She has done well since August 2021.She has not had any other flares and she had felt well. She has not gone for ordered/ recommended labs yet. It is again advised to have labs. Declined infectious disease referral in the past.   Patient  denies any fever, body aches,chills, rash, chest pain, shortness of breath, nausea, vomiting, or diarrhea.    Patient Active Problem List   Diagnosis Date Noted   Sprain of knee 05/03/2020   Morbid obesity (HCC) 04/29/2020   Body mass index (BMI) of 50-59.9 in adult [facility]) 04/29/2020   Venous insufficiency 04/29/2020   History of MRSA infection 04/29/2020   Varicose veins of both lower extremities with inflammation 04/29/2020   Skin irritation 04/29/2020   Encounter for incision and drainage procedure 04/29/2020   Pain in both lower extremities 04/29/2020   Cellulitis of left lower extremity 04/29/2020   Abdominal wall cellulitis 03/18/2020   Abscess of abdominal wall 03/18/2020   Abscess of right arm 03/18/2020   Past  Medical History:  Diagnosis Date   Allergy    Past Surgical History:  Procedure Laterality Date   eyelid surgery     Social History   Tobacco Use   Smoking status: Never Smoker   Smokeless tobacco: Never Used  Substance Use Topics   Alcohol use: Never   Drug use: Never       Medications: Outpatient Medications Prior to Visit  Medication Sig   mupirocin ointment (BACTROBAN) 2 % Apply thin layer to area of concern as needed.   [DISCONTINUED] ibuprofen (ADVIL) 800 MG tablet Take 800 mg by mouth 3 (three) times daily.   meloxicam (MOBIC) 15 MG tablet Take 15 mg by mouth daily.   [DISCONTINUED] cephALEXin (KEFLEX) 500 MG capsule Take 1 capsule (500 mg total) by mouth 4 (four) times daily. (Patient not taking: Reported on 08/30/2020)   [DISCONTINUED] [employer] ANTISEPTIC SKIN CLEANSER 4 % SOLN Apply topically daily. (Patient not taking: Reported on 08/30/2020)   [DISCONTINUED] sulfamethoxazole-trimethoprim (BACTRIM DS) 800-160 MG tablet Take 1 tablet by mouth 2 (two) times daily. (Patient not taking: Reported on 08/30/2020)   No facility-administered medications prior to visit.    Review of Systems  Constitutional: Negative.   Skin: Positive for color change and rash.    Last CBC Lab Results  Component Value Date   WBC 6.4 03/21/2020   HGB 11.3 (L) 03/21/2020  HCT 35.3 (L) 03/21/2020   MCV 82.3 03/21/2020   MCH 26.3 03/21/2020   RDW 15.4 03/21/2020   PLT 260 03/21/2020      Objective    BP 134/88 (BP Location: Left Wrist)    Pulse 85    Temp 98.4 F (36.9 C)    Resp 16    Ht 5\' 10"  (1.778 m)    Wt (!) 385 lb (174.6 kg)    BMI 55.24 kg/m    Physical Exam Constitutional:      General: She is not in acute distress.    Appearance: She is not ill-appearing, toxic-appearing or diaphoretic.  HENT:     Right Ear: External ear normal.     Left Ear: External ear normal.     Mouth/Throat:     Mouth: Mucous membranes are moist.  Eyes:     Pupils: Pupils are equal,  round, and reactive to light.  Cardiovascular:     Rate and Rhythm: Normal rate and regular rhythm.     Pulses: Normal pulses.     Heart sounds: Normal heart sounds.  Pulmonary:     Effort: Pulmonary effort is normal.     Breath sounds: Normal breath sounds.  Abdominal:     General: There is no distension.     Palpations: Abdomen is soft.     Tenderness: There is no abdominal tenderness.  Skin:    General: Skin is warm.     Capillary Refill: Capillary refill takes less than 2 seconds.     Findings: Erythema and lesion present.          Comments: Healing lesions on arms, and near axillary, mild erythema, no drainage or induration or warmth.   Small erythematous papule on lower abdomen as marked on diagram. No induration or warmth. No drainage.    Neurological:     General: No focal deficit present.     Mental Status: She is alert and oriented to person, place, and time.  Psychiatric:        Mood and Affect: Mood normal.        Behavior: Behavior normal.        Thought Content: Thought content normal.        Judgment: Judgment normal.       No results found for any visits on 08/30/20.  Assessment & Plan     Skin infection  Morbid obesity (HCC) - Plan: CBC with Differential/Platelet, Comprehensive metabolic panel, TSH, Lipid panel  History of MRSA infection  Skin irritation   Restart Hibiclens daily as directed.   Meds ordered this encounter  Medications   [employer] ANTISEPTIC SKIN CLEANSER 4 % SOLN    Sig: Apply 1 Bottle topically daily.    Dispense:  237 mL    Refill:  1   sulfamethoxazole-trimethoprim (BACTRIM DS) 800-160 MG tablet    Sig: Take 1 tablet by mouth 2 (two) times daily.    Dispense:  20 tablet    Refill:  0   cephALEXin (KEFLEX) 500 MG capsule    Sig: Take 1 capsule (500 mg total) by mouth 3 (three) times daily.    Dispense:  30 capsule    Refill:  0   LAB'S advised today/ Patient verbalized understanding of all instructions given and denies  any further questions at this time.   If persists needs infectious disease consult.    Red Flags discussed. The patient was given clear instructions to go to ER or return to medical center  if any red flags develop, symptoms do not improve, worsen or new problems develop. They verbalized understanding.  Advised patient call the office or your primary care doctor for an appointment if no improvement within 72 hours or if any symptoms change or worsen at any time  Advised ER or urgent Care if after hours or on weekend. Call 911 for emergency symptoms at any time.Patinet verbalized understanding of all instructions given/reviewed and treatment plan and has no further questions or concerns at this time.     Return in about 2 weeks (around 09/13/2020), or if symptoms worsen or fail to improve, for at any time for any worsening symptoms, Go to Emergency room/ urgent care if worse.     IBeverely Pace Kaytlen Lightsey, FNP, have reviewed all documentation for this visit. The documentation on 08/30/20 for the exam, diagnosis, procedures, and orders are all accurate and complete.   Addressed  chronic medical problems today requiring reviewing patients medical record,labs, counseling patient regarding patient's conditions, any medications, answering questions regarding health, and coordination of care as needed. After visit summary patient given copy and reviewed.  Jairo Ben, FNP  [facility] [phone] (phone) [phone] (fax)  [facility]

## 2020-08-31 ENCOUNTER — Telehealth: Payer: Self-pay

## 2020-08-31 DIAGNOSIS — D649 Anemia, unspecified: Secondary | ICD-10-CM

## 2020-08-31 LAB — LIPID PANEL
Chol/HDL Ratio: 2.6 ratio (ref 0.0–4.4)
Cholesterol, Total: 159 mg/dL (ref 100–199)
HDL: 62 mg/dL (ref >39)
LDL Chol Calc (NIH): 87 mg/dL (ref 0–99)
Triglycerides: 49 mg/dL (ref 0–149)
VLDL Cholesterol Cal: 10 mg/dL (ref 5–40)

## 2020-08-31 LAB — CBC WITH DIFFERENTIAL/PLATELET
Basophils Absolute: 0 10*3/uL (ref 0.0–0.2)
Basos: 1 %
EOS (ABSOLUTE): 0.1 10*3/uL (ref 0.0–0.4)
Eos: 1 %
Hematocrit: 38 % (ref 34.0–46.6)
Hemoglobin: 11.9 g/dL (ref 11.1–15.9)
Immature Grans (Abs): 0 10*3/uL (ref 0.0–0.1)
Immature Granulocytes: 0 %
Lymphocytes Absolute: 1.3 10*3/uL (ref 0.7–3.1)
Lymphs: 22 %
MCH: 26 pg — ABNORMAL LOW (ref 26.6–33.0)
MCHC: 31.3 g/dL — ABNORMAL LOW (ref 31.5–35.7)
MCV: 83 fL (ref 79–97)
Monocytes Absolute: 0.4 10*3/uL (ref 0.1–0.9)
Monocytes: 6 %
Neutrophils Absolute: 4.3 10*3/uL (ref 1.4–7.0)
Neutrophils: 70 %
Platelets: 270 10*3/uL (ref 150–450)
RBC: 4.57 x10E6/uL (ref 3.77–5.28)
RDW: 14.6 % (ref 11.7–15.4)
WBC: 6.2 10*3/uL (ref 3.4–10.8)

## 2020-08-31 LAB — COMPREHENSIVE METABOLIC PANEL
ALT: 18 IU/L (ref 0–32)
AST: 14 IU/L (ref 0–40)
Albumin/Globulin Ratio: 1.5 (ref 1.2–2.2)
Albumin: 4.1 g/dL (ref 3.8–4.8)
Alkaline Phosphatase: 84 IU/L (ref 44–121)
BUN/Creatinine Ratio: 15 (ref 9–23)
BUN: 12 mg/dL (ref 6–24)
Bilirubin Total: 0.7 mg/dL (ref 0.0–1.2)
CO2: 24 mmol/L (ref 20–29)
Calcium: 9.6 mg/dL (ref 8.7–10.2)
Chloride: 101 mmol/L (ref 96–106)
Creatinine, Ser: 0.78 mg/dL (ref 0.57–1.00)
GFR calc Af Amer: 105 mL/min/{1.73_m2} (ref >59)
GFR calc non Af Amer: 91 mL/min/{1.73_m2} (ref >59)
Globulin, Total: 2.8 g/dL (ref 1.5–4.5)
Glucose: 93 mg/dL (ref 65–99)
Potassium: 4.5 mmol/L (ref 3.5–5.2)
Sodium: 139 mmol/L (ref 134–144)
Total Protein: 6.9 g/dL (ref 6.0–8.5)

## 2020-08-31 LAB — TSH: TSH: 1.6 u[IU]/mL (ref 0.450–4.500)

## 2020-08-31 NOTE — Telephone Encounter (Signed)
-----   Message from Berniece Pap, FNP sent at 08/31/2020  9:08 AM EDT ----- Labs look good, no signs of blood infection, looks like she was mildly anemic around 5 months ago and this lab shows that is resolving- hemoglobin is still on the low end. I do recommend her to take a multivitamin with iron or increase iron rich foods.  Recheck CBC and iron/ TIBC panel add as future lab in three months.

## 2020-08-31 NOTE — Progress Notes (Signed)
Labs look good, no signs of blood infection, looks like she was mildly anemic around  5 months ago and this lab shows that is resolving- hemoglobin is still on the low end. I do recommend her to take a multivitamin with iron or increase iron rich foods.  Recheck CBC and iron/ TIBC panel add as future lab in three months.

## 2020-08-31 NOTE — Progress Notes (Signed)
Labs look good, no signs of blood infection, looks like she was mildly anemic around  5 months ago and this lab shows that is resolving- hemoglobin is still on the low end. I do recommend her to take a multivitamin with iron or increase iron rich foods.  Recheck CBC and iron/ TIBC panel add as future lab in three months.

## 2020-08-31 NOTE — Telephone Encounter (Signed)
lmtcb okay for [facility] triage to advise patient of lab results, will place in chart future order for patient to have labs drawn again in 3 months. KW

## 2020-09-01 NOTE — Telephone Encounter (Signed)
Reviewed lab results and provider's note with the patient. Listed several iron-rich foods. Offered to schedule 3 month recheck for labs but she preferred to call back.

## 2020-09-22 ENCOUNTER — Ambulatory Visit: Payer: Self-pay | Admitting: Adult Health

## 2020-12-07 ENCOUNTER — Telehealth: Payer: Self-pay

## 2020-12-07 ENCOUNTER — Other Ambulatory Visit: Payer: Self-pay

## 2020-12-07 DIAGNOSIS — Z20822 Contact with and (suspected) exposure to covid-19: Secondary | ICD-10-CM

## 2020-12-07 NOTE — Telephone Encounter (Signed)
Copied from CRM [id number]. Topic: General - Other >> Dec 06, 2020  1:42 PM Lyn Hollingshead D wrote: PT need a covid test / she was expose to daughter who tested positive

## 2020-12-07 NOTE — Telephone Encounter (Signed)
Pt is scheduled for covid testing today,1 /09/2021.

## 2020-12-07 NOTE — Telephone Encounter (Signed)
Copied from CRM [id number]. Topic: General - Other >> Dec 07, 2020  8:02 AM Lyn Hollingshead D wrote: PT need a covid test / she has symptoms / [phone] / please advise

## 2020-12-09 ENCOUNTER — Telehealth: Payer: Self-pay | Admitting: Adult Health

## 2020-12-09 LAB — NOVEL CORONAVIRUS, NAA: SARS-CoV-2, NAA: DETECTED — AB

## 2020-12-09 LAB — SARS-COV-2, NAA 2 DAY TAT

## 2020-12-09 NOTE — Telephone Encounter (Unsigned)
Copied from CRM [id number]. Topic: General - Other >> Dec 09, 2020  9:48 AM Marylen Ponto wrote: Reason for CRM: Pt requested to speak with a nurse after reviewing her Covid results. Pt has some questions and requests a call back.

## 2020-12-09 NOTE — Telephone Encounter (Signed)
Called Pt LMOM. 

## 2021-01-14 ENCOUNTER — Telehealth: Payer: Self-pay | Admitting: Adult Health

## 2021-01-14 NOTE — Telephone Encounter (Signed)
Requested medication (s) are due for refill today - no  Requested medication (s) are on the active medication list -yes  Future visit scheduled -no  Last refill: 4 months ago  Notes to clinic: Request RF for medication not assigned protocol- for chronic problem  Requested Prescriptions  Pending Prescriptions Disp Refills   cephALEXin (KEFLEX) 500 MG capsule 30 capsule 0    Sig: Take 1 capsule (500 mg total) by mouth 3 (three) times daily.      Off-Protocol Failed - 01/14/2021  4:13 PM      Failed - Medication not assigned to a protocol, review manually.      Passed - Valid encounter within last 12 months    Recent Outpatient Visits           4 months ago Skin infection   [facility] Flinchum, Eula Fried, FNP   6 months ago Abscess of right arm   [facility] Flinchum, Eula Fried, FNP   7 months ago Abscess of right arm   [facility] Flinchum, Eula Fried, FNP   8 months ago Morbid obesity [facility])   [facility] Flinchum, Eula Fried, FNP                    Requested Prescriptions  Pending Prescriptions Disp Refills   cephALEXin (KEFLEX) 500 MG capsule 30 capsule 0    Sig: Take 1 capsule (500 mg total) by mouth 3 (three) times daily.      Off-Protocol Failed - 01/14/2021  4:13 PM      Failed - Medication not assigned to a protocol, review manually.      Passed - Valid encounter within last 12 months    Recent Outpatient Visits           4 months ago Skin infection   [facility] Flinchum, Eula Fried, FNP   6 months ago Abscess of right arm   [facility] Flinchum, Eula Fried, FNP   7 months ago Abscess of right arm   [facility] Flinchum, Eula Fried, FNP   8 months ago Morbid obesity [facility])   [facility] Flinchum, Eula Fried, FNP

## 2021-01-14 NOTE — Telephone Encounter (Signed)
Medication Refill - Medication: cephALEXin (KEFLEX) 500 MG capsule Pt has places that have come up on her and needs a refill / please advise when sent   Has the patient contacted their pharmacy? No. (Agent: If no, request that the patient contact the pharmacy for the refill.) (Agent: If yes, when and what did the pharmacy advise?)  Preferred Pharmacy (with phone number or street name[facility] [address] [place], [place] - [address]  [address] [place], [place] [address]  Phone:  [phone] Fax:  [phone]   Agent: Please be advised that RX refills may take up to 3 business days. We ask that you follow-up with your pharmacy.

## 2021-01-14 NOTE — Telephone Encounter (Signed)
No refills on antibiotics.  Unclear why wanting a covid test , exposure and if so when  ? No symptoms.

## 2021-01-17 NOTE — Telephone Encounter (Signed)
Pt tested positive in 01/22. Her second job is requiring a negative COVID test. Client reached out to the patient today stating if she did not return back to work. She would have to replace the pt. Please advise [phone] Can leave a Vm.

## 2021-01-18 NOTE — Telephone Encounter (Signed)
Ok to place covid test for today. Results will not be back for 24 to 72 hours she may need [facility] if she needs results back today.

## 2021-01-18 NOTE — Telephone Encounter (Signed)
Pt returned call to office. Pt requests call back. °

## 2021-01-18 NOTE — Telephone Encounter (Signed)
[id number]-KW 

## 2021-01-19 ENCOUNTER — Telehealth (INDEPENDENT_AMBULATORY_CARE_PROVIDER_SITE_OTHER): Payer: BC Managed Care – PPO | Admitting: Adult Health

## 2021-01-19 ENCOUNTER — Other Ambulatory Visit: Payer: Self-pay

## 2021-01-19 ENCOUNTER — Encounter: Payer: Self-pay | Admitting: Adult Health

## 2021-01-19 DIAGNOSIS — Z8614 Personal history of Methicillin resistant Staphylococcus aureus infection: Secondary | ICD-10-CM

## 2021-01-19 DIAGNOSIS — L989 Disorder of the skin and subcutaneous tissue, unspecified: Secondary | ICD-10-CM | POA: Insufficient documentation

## 2021-01-19 DIAGNOSIS — Z8616 Personal history of COVID-19: Secondary | ICD-10-CM | POA: Insufficient documentation

## 2021-01-19 HISTORY — DX: Disorder of the skin and subcutaneous tissue, unspecified: L98.9

## 2021-01-19 MED ORDER — SULFAMETHOXAZOLE-TRIMETHOPRIM 800-160 MG PO TABS: 1.0000 | ORAL_TABLET | Freq: Two times a day (BID) | ORAL | 0 refills | Status: DC

## 2021-01-19 MED ORDER — CEPHALEXIN 500 MG PO CAPS: 500.0000 mg | ORAL_CAPSULE | Freq: Three times a day (TID) | ORAL | 0 refills | Status: DC

## 2021-01-19 MED ORDER — CVS ANTISEPTIC SKIN CLEANSER 4 % EX SOLN: 1.0000 | Freq: Every day | CUTANEOUS | 1 refills | Status: DC

## 2021-01-19 NOTE — Telephone Encounter (Signed)
LMTCB

## 2021-01-19 NOTE — Progress Notes (Addendum)
MyChart Video Visit    Virtual Visit via Video Note   This visit type was conducted due to national recommendations for restrictions regarding the COVID-19 Pandemic (e.g. social distancing) in an effort to limit this patient's exposure and mitigate transmission in our community. This patient is at least at moderate risk for complications without adequate follow up. This format is felt to be most appropriate for this patient at this time. Physical exam was limited by quality of the video and audio technology used for the visit.  Parties involved in visit as below:   Patient location:at work  Provider location: Provider: Librarian, academic at  [employer], [place].    I discussed the limitations of evaluation and management by telemedicine and the availability of in person appointments. The patient expressed understanding and agreed to proceed.  Patient: Regina Snyder   DOB: [birth year]   45 y.o. Female  MRN: [id number] Visit Date: 01/19/2021  Today's healthcare provider: Jairo Ben, FNP   No chief complaint on file.  Subjective    HPI  Rash: Patient complains of rash involving the did not specifiy lower leg. Rash started 7 days ago approximately. Appearance of rash at onset: Color of lesion(s): red. Rash has changed over time Initial distribution: , buttock and torso.  Discomfort associated with rash: itchy.    Associated symptoms: none. Denies: none. Patient has had previous evaluation of rash. Patient has had previous treatment (Keflex).  Response to treatment: helping. Patient is not sure if she had contacts with similar rash. Patient has not identified precipitant. Patient has not had new exposures (soaps, lotions, laundry detergents, foods, medications, plants, insects or animals.)  Spot description: Some look like pimples with pus, red spot with area in middle, hard to the touch. Location: lower stomach, glute, back of leg, ribs Pt has history of  known MRSA. Has been recommended to see infectious disease referral and has declined.  Using Keflex  BID 3-4 days seems to be helping but is having new spots pop up.  Patient  denies any fever, body aches,chills, rash, chest pain, shortness of breath, nausea, vomiting, or diarrhea.  Denies dizziness, lightheadedness, pre syncopal or syncopal episodes.   Needs covid test as well- she reports she has the remainder of a occasional  Cough she reports post nasal drip only.Denies any chest congestion or pain.  she was diagnosed in January 13 th. She declines further work up but needs a negative covid test to return to her 2nd job.    Patient  denies any fever, body aches,chills, rash, chest pain, shortness of breath, nausea, vomiting, or diarrhea.  Denies dizziness, lightheadedness, pre syncopal or syncopal episodes.    Past Medical History:  Diagnosis Date  . Allergy       Medications: Outpatient Medications Prior to Visit  Medication Sig  . meloxicam (MOBIC) 15 MG tablet Take 15 mg by mouth daily.  . mupirocin ointment (BACTROBAN) 2 % Apply thin layer to area of concern as needed.  . [DISCONTINUED] cephALEXin (KEFLEX) 500 MG capsule Take 1 capsule (500 mg total) by mouth 3 (three) times daily.  . [DISCONTINUED] [employer]S ANTISEPTIC SKIN CLEANSER 4 % SOLN Apply 1 Bottle topically daily.  . [DISCONTINUED] sulfamethoxazole-trimethoprim (BACTRIM DS) 800-160 MG tablet Take 1 tablet by mouth 2 (two) times daily.   No facility-administered medications prior to visit.    Review of Systems  Constitutional: Negative.   HENT: Negative.   Respiratory: Negative.   Cardiovascular: Negative.  Gastrointestinal: Negative.   Genitourinary: Negative.   Musculoskeletal: Negative.   Skin: Positive for color change.  Neurological: Negative.   Psychiatric/Behavioral: Negative.       Objective    There were no vitals taken for this visit.   Physical Exam   Patient is alert and oriented and  responsive to questions Engages in conversation with provider. Speaks in full sentences without any pauses without any shortness of breath or distress.   Assessment & Plan     1. History of COVID-19 Needs for work.  - SAR CoV2 Serology (COVID 19)AB(IGG)IA; Future  2. Bumps on skin  - sulfamethoxazole-trimethoprim (BACTRIM DS) 800-160 MG tablet; Take 1 tablet by mouth 2 (two) times daily.  Dispense: 20 tablet; Refill: 0 - CVS ANTISEPTIC SKIN CLEANSER 4 % SOLN; Apply 1 Bottle topically daily.  Dispense: 237 mL; Refill: 1 - cephALEXin (KEFLEX) 500 MG capsule; Take 1 capsule (500 mg total) by mouth 3 (three) times daily.  Dispense: 30 capsule; Refill: 0  3. History of MRSA infection  - sulfamethoxazole-trimethoprim (BACTRIM DS) 800-160 MG tablet; Take 1 tablet by mouth 2 (two) times daily.  Dispense: 20 tablet; Refill: 0 - CVS ANTISEPTIC SKIN CLEANSER 4 % SOLN; Apply 1 Bottle topically daily.  Dispense: 237 mL; Refill: 1 - cephALEXin (KEFLEX) 500 MG capsule; Take 1 capsule (500 mg total) by mouth 3 (three) times daily.  Dispense: 30 capsule; Refill: 0   Orders Placed This Encounter  Procedures  . COVID-19, Flu A+B and RSV    Order Specific Question:   Is this test for diagnosis or screening    Answer:   Screening    Order Specific Question:   Symptomatic for COVID-19 as defined by CDC    Answer:   No    Order Specific Question:   Hospitalized for COVID-19    Answer:   No    Order Specific Question:   Admitted to ICU for COVID-19    Answer:   No    Order Specific Question:   Previously tested for COVID-19    Answer:   Yes    Order Specific Question:   Resident in a congregate (group) care setting    Answer:   No    Order Specific Question:   Is the patient student?    Answer:   No    Order Specific Question:   Employed in healthcare setting    Answer:   No    Order Specific Question:   Pregnant    Answer:   No    Order Specific Question:   Has patient completed COVID  vaccination(s) (2 doses of Pfizer/Moderna 1 dose of [employer])    Answer:   Unknown  . Comprehensive Metabolic Panel (CMET)  . CBC with Differential/Platelet  infectious disease referral still advised.   Red Flags discussed. The patient was given clear instructions to go to ER or return to medical center if any red flags develop, symptoms do not improve, worsen or new problems develop. They verbalized understanding.  Return in about 1 week (around 01/26/2021), or if symptoms worsen or fail to improve, for at any time for any worsening symptoms, Go to Emergency room/ urgent care if worse.     I discussed the assessment and treatment plan with the patient. The patient was provided an opportunity to ask questions and all were answered. The patient agreed with the plan and demonstrated an understanding of the instructions.   The patient was advised to  call back or seek an in-person evaluation if the symptoms worsen or if the condition fails to improve as anticipated.  I provided 25 minutes of non-face-to-face time during this encounter. The entirety of the information documented in the History of Present Illness, Review of Systems and Physical Exam were personally obtained by me. Portions of this information were initially documented by the CMA and reviewed by me for thoroughness and accuracy.    Jairo Ben, FNP [facility] [phone] (phone) [phone] (fax)  [facility]

## 2021-01-19 NOTE — Addendum Note (Signed)
Addended by: Berniece Pap on: 01/19/2021 04:26 PM   Modules accepted: Orders

## 2021-01-19 NOTE — Patient Instructions (Signed)
MRSA Infection, Diagnosis, Adult Methicillin-resistant Staphylococcus aureus (MRSA) infection is caused by bacteria called Staphylococcus aureus, or staph, that no longer respond to common antibiotic medicines (drug-resistant bacteria). MRSA infection can be hard to treat. Most of the time, MRSA can be on the skin or in the nose without causing problems (colonized). However, if MRSA enters the body through a cut, a sore, or an invasive medical device, it can cause a serious infection. What are the causes? This condition is caused by staph bacteria. Illness may develop after exposure to the bacteria through:  Skin-to-skin contact with someone who is infected with MRSA.  Touching surfaces that have the bacteria on them.  Having a procedure or using equipment that allows MRSA to enter the body.  Having MRSA that lives on your skin and then enters your body through: ? A cut or scratch. ? A surgery or procedure. ? The use of a medical device. Contact with the bacteria may occur:  During a stay in a hospital, rehabilitation facility, nursing home, or other health care facility (health care-associated MRSA).  In daily activities where there is close contact with others, such as sports, child care centers, or at home (community-associated MRSA). What increases the risk? You are more likely to develop this condition if you:  Have a surgery or procedure.  Have an IV or a thin tube (catheter) placed in your body.  Are elderly.  Are on kidney dialysis.  Have recently taken an antibiotic medicine.  Live in a long-term care facility.  Have a chronic wound or skin ulcer.  Have a weak body defense system (immune system).  Play sports that involve skin-to-skin contact.  Live in a crowded place, like a dormitory or military barracks.  Share towels, razors, or sports equipment with other people.  Have a history of MRSA infection or colonization. What are the signs or symptoms? Symptoms  of this condition depend on the area that is affected. Symptoms may include:  A pus-filled pimple or boil.  Pus that drains from your skin.  A sore (abscess) under your skin or somewhere in your body.  Fever with or without chills.  Difficulty breathing.  Coughing up blood.  Redness, warmth, swelling, or pain in the affected area. How is this diagnosed? This condition may be diagnosed based on:  A physical exam.  Your medical history.  Taking a sample from the infected area and growing it in a lab (culture). You may also have other tests, including:  Imaging tests, such as X-rays, a CT scan, or an MRI.  Lab tests, such as blood, urine, or phlegm (sputum) tests. You skin or nose may be swabbed when you are admitted to a health care facility for a procedure. This is to screen for MRSA. How is this treated? Treatment depends on the type of MRSA infection you have and how severe, deep, or extensive it is. Treatment may include:  Antibiotic medicines.  Surgery to drain pus from the infected area. Severe infections may require a hospital stay. Follow these instructions at home: Medicines  Take over-the-counter and prescription medicines only as told by your health care provider.  If you were prescribed an antibiotic medicine, use it as told by your health care provider. Do not stop using the antibiotic even if you start to feel better. Prevention Follow these instructions to avoid spreading the infection to others:  Wash your hands frequently with soap and water. If soap and water are not available, use an alcohol-based hand sanitizer.    Avoid close contact with those around you as much as possible. Do not use towels, razors, toothbrushes, bedding, or other items that will be used by others.  Wash towels, bedding, and clothes in the washing machine with detergent and hot water. Dry them in a hot dryer.  Clean surfaces regularly to remove germs (disinfection). Use products  or solutions that contain bleach. Make sure you disinfect bathroom surfaces, food preparation areas, exercise equipment, and doorknobs.   General instructions  If you have a wound, follow instructions from your health care provider about how to take care of your wound. ? Do not pick at scabs. ? Do not try to drain any infection sites or pimples.  Tell all your health care providers that you have MRSA, or if you have ever had a MRSA infection.  Keep all follow-up visits as told by your health care provider. This is important. Contact a health care provider if you:  Do not get better.  Have symptoms that get worse.  Have new symptoms. Get help right away if you have:  Nausea or vomiting, or if you cannot take medicine without vomiting.  Trouble breathing.  Chest pain. These symptoms may represent a serious problem that is an emergency. Do not wait to see if the symptoms will go away. Get medical help right away. Call your local emergency services (911 in the [place] Do not drive yourself to the hospital. Summary  MRSA infection is caused by bacteria called Staphylococcus aureus, or staph, that no longer respond to common antibiotic medicines.  Treatment for this condition depends on the type of MRSA infection you have and how severe, deep, and extensive it is.  If you were prescribed an antibiotic medicine, use it as told by your health care provider. Do not stop using the antibiotic even if you start to feel better.  Follow instructions from your health care provider to avoid spreading the infection to others. This information is not intended to replace advice given to you by your health care provider. Make sure you discuss any questions you have with your health care provider. Document Revised: 01/30/2019 Document Reviewed: 01/31/2019 Elsevier Patient Education  2021 Elsevier Inc.  

## 2021-01-21 LAB — COVID-19, FLU A+B AND RSV
Influenza A, NAA: NOT DETECTED
Influenza B, NAA: NOT DETECTED
RSV, NAA: NOT DETECTED
SARS-CoV-2, NAA: NOT DETECTED

## 2021-01-21 NOTE — Progress Notes (Signed)
Negative for COVID, FLU  A& B and RSV.

## 2021-03-10 IMAGING — CT CT ABD-PELV W/ CM
2 of 5 series · 16 of 46 positions shown, 18 images · IV contrast (omnipaque)
Comparison: None.

CLINICAL DATA: Right periumbilical abscess lanced in the emergency
room yesterday. Evaluate for extent of infection.

EXAM:
CT ABDOMEN AND PELVIS WITH CONTRAST
TECHNIQUE: Multidetector CT imaging of the abdomen and pelvis was performed
using the standard protocol following bolus administration of
intravenous contrast.
CONTRAST:  125mL OMNIPAQUE IOHEXOL 300 MG/ML  SOLN

[Series 2: routine abd/pel with · axial · 0.98mm/px · z∈[-586,-131]mm · 13 of 105 slices shown, 15 images]
[im 7/105  soft-tissue]
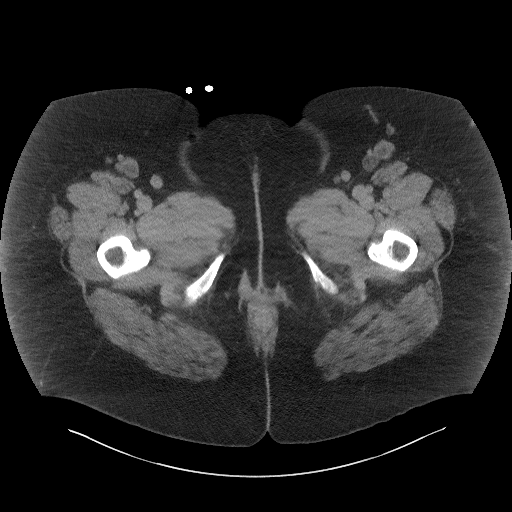
[im 7/105  bone]
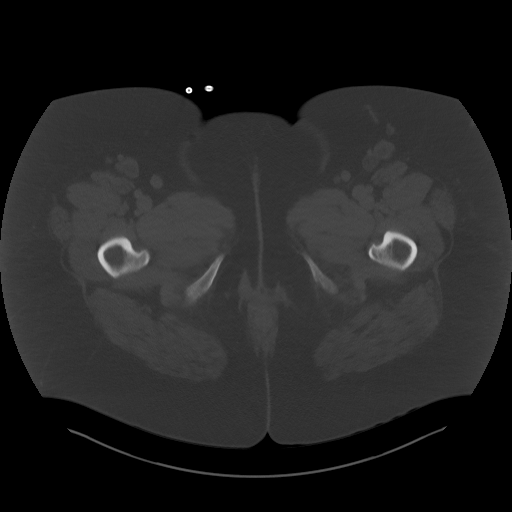
[im 13/105  soft-tissue]
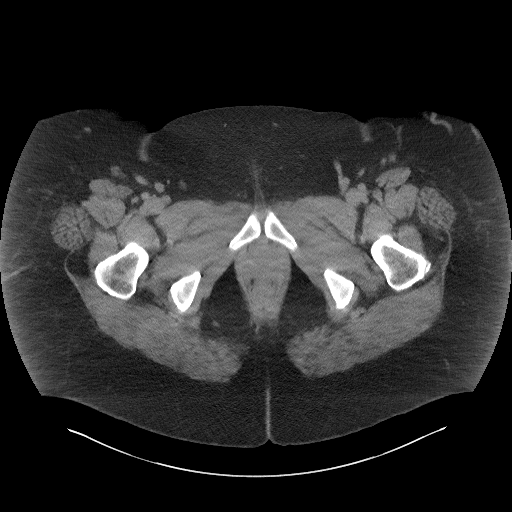
[im 25/105  soft-tissue]
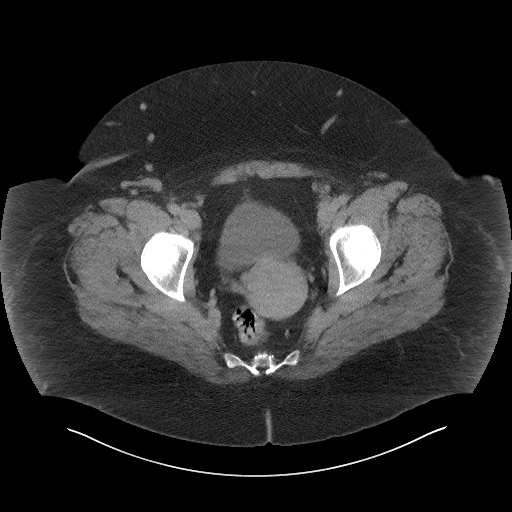
[im 31/105  soft-tissue]
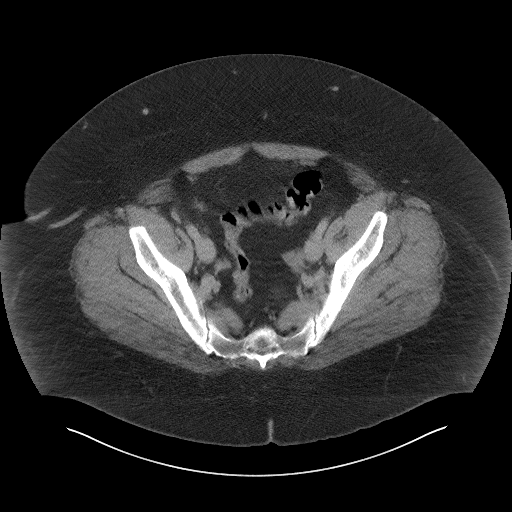
[im 37/105  soft-tissue]
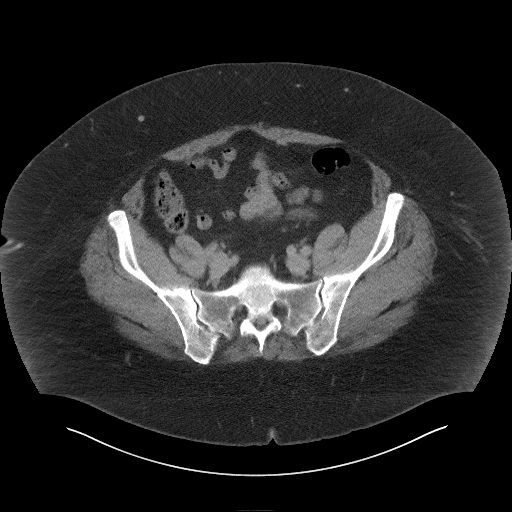
[im 43/105  soft-tissue]
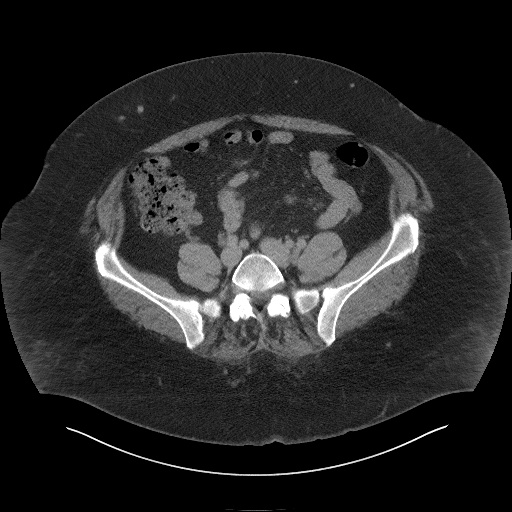
[im 56/105  soft-tissue]
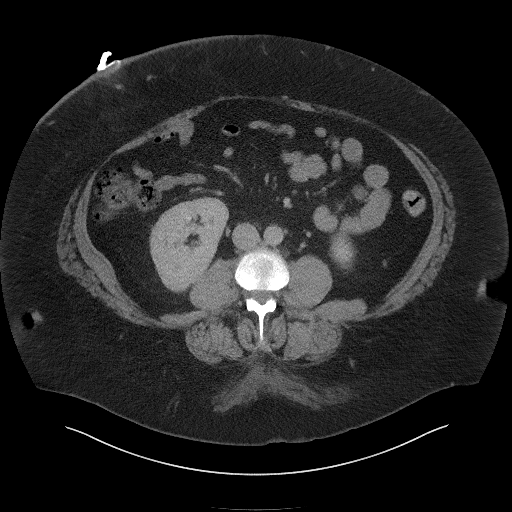
[im 62/105  soft-tissue]
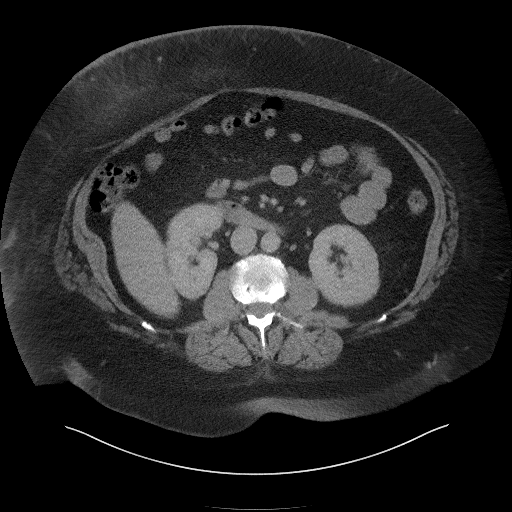
[im 68/105  soft-tissue]
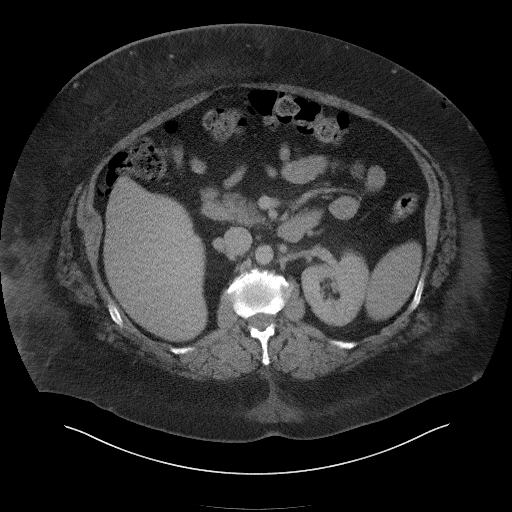
[im 68/105  bone]
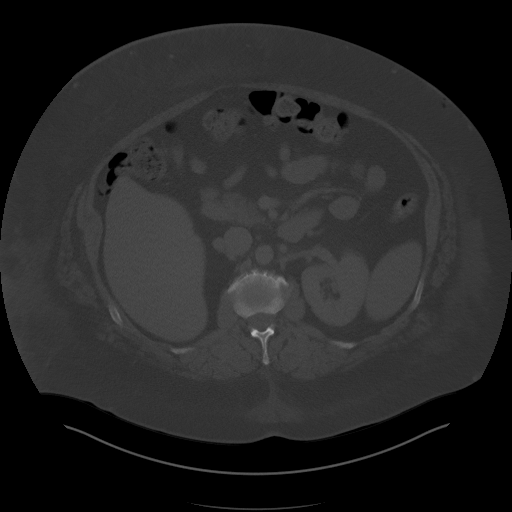
[im 74/105  soft-tissue]
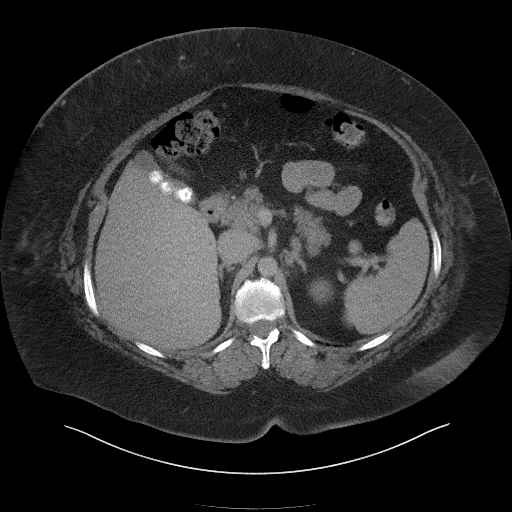
[im 80/105  soft-tissue]
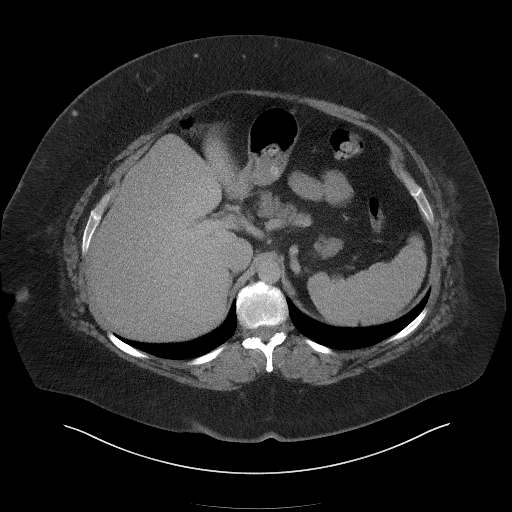
[im 92/105  soft-tissue]
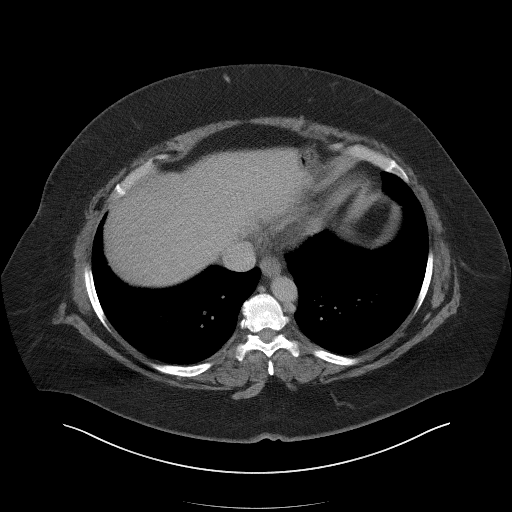
[im 98/105  soft-tissue]
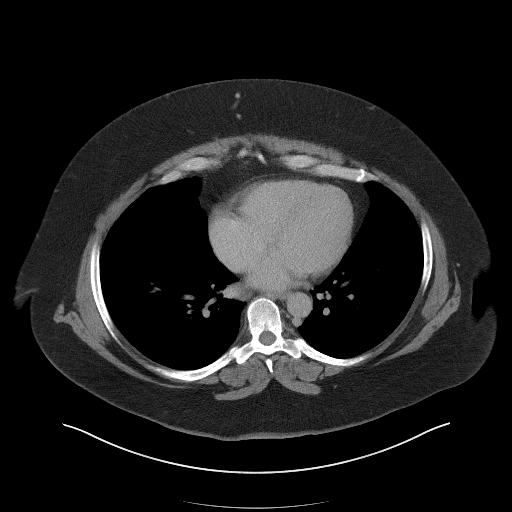

[Series 5: coronal st · coronal · 0.83mm/px · 3 of 139 slices shown]
[im 47/139  soft-tissue]
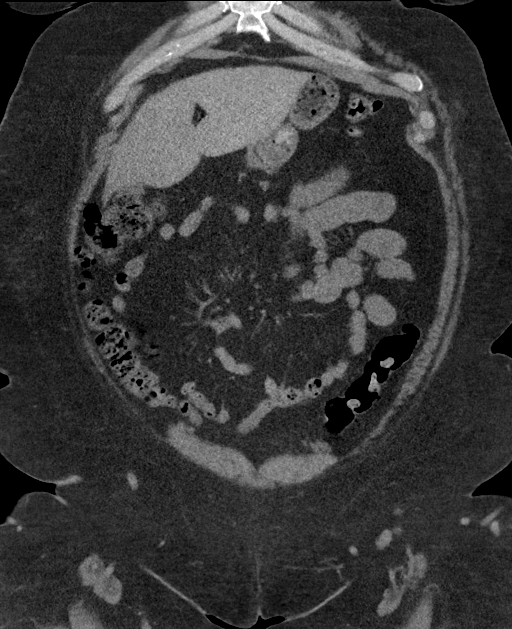
[im 62/139  soft-tissue]
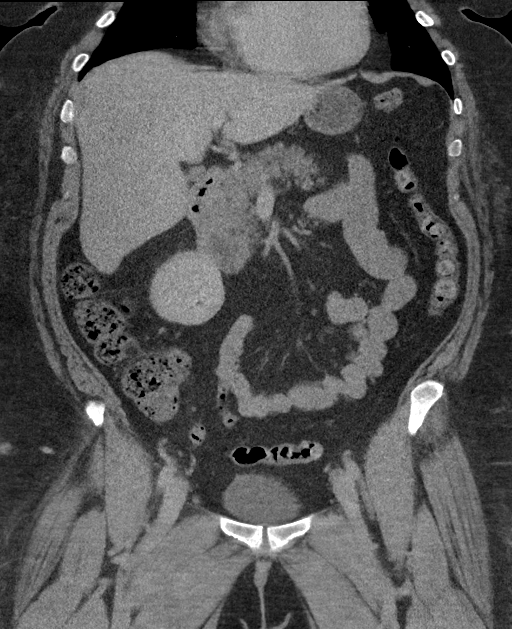
[im 77/139  soft-tissue]
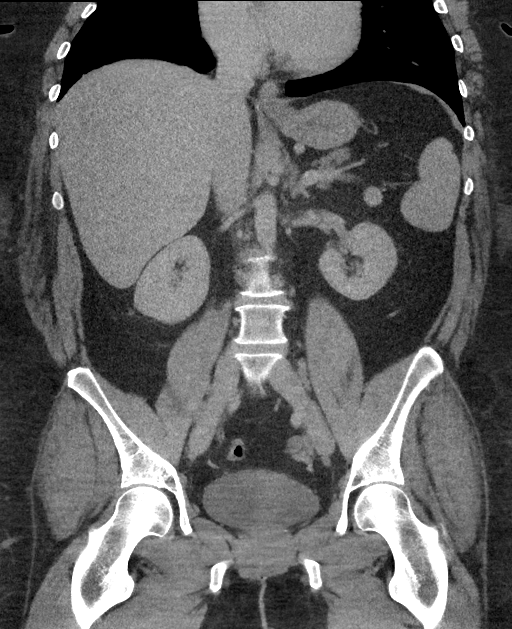

[16 of 46 positions shown; findings below may reference images not displayed]

FINDINGS: Lower chest: Clear lung bases.  Heart normal in size.

Hepatobiliary: Normal liver. Multiple gallstones and a mostly
decompressed gallbladder. No wall thickening or inflammation. No
bile duct dilation.

Pancreas: Unremarkable. No pancreatic ductal dilatation or
surrounding inflammatory changes.

Spleen: Normal in size without focal abnormality.

Adrenals/Urinary Tract: Adrenal glands are unremarkable. Kidneys are
normal, without renal calculi, focal lesion, or hydronephrosis.
Bladder is unremarkable.

Stomach/Bowel: Normal stomach. Small bowel and colon are normal in
caliber. No wall thickening. No inflammation. Probable decompressed
normal appendix visualized. No evidence of appendicitis.

Vascular/Lymphatic: No significant vascular findings are present. No
enlarged abdominal or pelvic lymph nodes.

Reproductive: Uterus and bilateral adnexa are unremarkable.

Other: Inflammation is noted in the subcutaneous fat of the right
mid abdomen with small bubbles of subcutaneous air. There is
overlying skin thickening. There is no defined abscess.

No abdominal wall hernia.  No ascites.

Musculoskeletal: No fracture or acute finding. No osteoblastic or
osteolytic lesions.
IMPRESSION: 1. Reported skin lesion, which was lanced yesterday, appears as a
superficial area of inflammation and subcutaneous air with a larger
area of diffuse hazy opacity in the subcutaneous fat consistent with
inflammation. However, there is no defined abscess.
2. No other evidence of an acute abnormality.
3. Gallstones without evidence of acute cholecystitis. No other
abnormalities within the abdomen or pelvis.

## 2021-07-04 ENCOUNTER — Telehealth: Payer: Self-pay

## 2021-07-04 ENCOUNTER — Telehealth: Payer: Self-pay | Admitting: Adult Health

## 2021-07-04 ENCOUNTER — Ambulatory Visit: Payer: Self-pay | Admitting: *Deleted

## 2021-07-04 ENCOUNTER — Ambulatory Visit: Payer: Self-pay | Admitting: Family

## 2021-07-04 NOTE — Telephone Encounter (Signed)
Patient notified and appointment has been scheduled 

## 2021-07-04 NOTE — Telephone Encounter (Signed)
Call pt  She will need to be seen in urgent care today for concern for bacterial infection.   Please ensure pt also has f/u with michelle as well.

## 2021-07-04 NOTE — Telephone Encounter (Signed)
Patient informed, Due to the high volume of calls and your symptoms we have to forward your call to our Triage Nurse to expedient your call. Please hold for the transfer.  Patient transferred to Access Nurse. Due to  Having a MRSA flare up and is unsure if her medicine dosage needs to be changed.  No available openings in office or virtual.

## 2021-07-04 NOTE — Telephone Encounter (Signed)
Copied from CRM [id number]. Topic: Appointment Scheduling - Scheduling Inquiry for Clinic >> Jul 04, 2021  9:17 AM Gaetana Michaelis A wrote: Reason for CRM: Patient would like to be contacted regarding scheduling a medication follow up   Agent was unable to successfully schedule at the time of call  Please contact further when possible

## 2021-07-04 NOTE — Telephone Encounter (Signed)
Copied from CRM [id number]. Topic: General - Other >> Jul 04, 2021  9:02 AM Gaetana Michaelis A wrote: Reason for CRM: Patient has called  [facility] in an attempt to book an appt to see Prov. Flinchum  Patient was told by [facility] staff that they would have to contact [facility] staff for "approval for transfer" before they could be seen at [facility]   Please contact further when possible

## 2021-07-04 NOTE — Telephone Encounter (Signed)
Spoken to patient, she is currently taking left over medication, she refuses to go to ED/UC. Last time she went they made her go to the hospital and she cant stay in the hospital again. She is a current single mother and doesn't have anyone to watch her children. She is still currently paying for the said bill. She stated she knows what it is and it has been responding to the medication already. She just needs more medication. She stated that her outbreak will have to get bad for her to go be evaluated. She doesn't have money to just be paying another bill. She stated she was upset that she cant get her medication. Patient didn't want to schedule a follow up until she know her PCP is back in office due to being told different dates for when she returns.

## 2021-07-04 NOTE — Telephone Encounter (Signed)
Call pt Advise I will work her in tomorrow at 930 She may come in person

## 2021-07-04 NOTE — Telephone Encounter (Signed)
FYI-Not sure if patient has an appointment with [facility] but I see another message that came after this one that patient was advised by them to be seen at an urgent care.

## 2021-07-04 NOTE — Telephone Encounter (Signed)
Opened in error. This patient is being treated by other.  Please disregard.

## 2021-07-04 NOTE — Telephone Encounter (Signed)
Patient has refused triage. She only wants an appointment. She has not been seen in this office. She was seen by Marvell Fuller, FNP in Feb @ [facility].

## 2021-07-05 ENCOUNTER — Telehealth (INDEPENDENT_AMBULATORY_CARE_PROVIDER_SITE_OTHER): Payer: Self-pay | Admitting: Family

## 2021-07-05 ENCOUNTER — Encounter: Payer: Self-pay | Admitting: Family

## 2021-07-05 VITALS — Ht 70.0 in

## 2021-07-05 DIAGNOSIS — L089 Local infection of the skin and subcutaneous tissue, unspecified: Secondary | ICD-10-CM

## 2021-07-05 MED ORDER — MUPIROCIN 2 % EX OINT: TOPICAL_OINTMENT | CUTANEOUS | 1 refills | Status: DC

## 2021-07-05 MED ORDER — SULFAMETHOXAZOLE-TRIMETHOPRIM 800-160 MG PO TABS: 1.0000 | ORAL_TABLET | Freq: Two times a day (BID) | ORAL | 0 refills | Status: AC

## 2021-07-05 MED ORDER — CLOTRIMAZOLE 1 % EX OINT: 1.0000 "application " | TOPICAL_OINTMENT | Freq: Two times a day (BID) | CUTANEOUS | 1 refills | Status: DC

## 2021-07-05 NOTE — Progress Notes (Signed)
Virtual Visit via Video Note  I connected with@  on 07/05/21 at  9:30 AM EDT by a video enabled telemedicine application and verified that I am speaking with the correct person using two identifiers.  Location patient: home Location provider:work  Persons participating in the virtual visit: patient, provider  I discussed the limitations of evaluation and management by telemedicine and the availability of in person appointments. The patient expressed understanding and agreed to proceed.  Interactive audio and video telecommunications were attempted between this provider and patient, however failed, due to patient having technical difficulties or patient did not have access to video capability.  We continued and completed visit with audio only.   HPI: Work in appointment  She was supposed to come in person however was going to be late and she called the office.   Complains of 'MRSA flareup', x 5 days, improving.  Started out as blisters on lateral sides bilateral arms, axilla. She popped them and since the lesions have healed. Scant serous drainage.  She also notes a couple of lesions on her stomach under skin fold. Describes as 'pimples'.    She would like a refill of keflex and sulfa. She didn't complete course from 01/19/21.   She has been using  mupirocin ointment, antiseptic wash with relief. Tried warm compresses once.   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   No fever, chills, nausea, vomiting    ROS: See pertinent positives and negatives per HPI.    EXAM:  VITALS per patient if applicable: Ht 5\' 10"  (1.778 m)   BMI 55.24 kg/m  BP Readings from Last 3 Encounters:  08/30/20 134/88  06/29/20 116/68  06/17/20 122/84   Wt Readings from Last 3 Encounters:  08/30/20 (!) 385 lb (174.6 kg)  06/29/20 (!) 391 lb (177.4 kg)  06/17/20 (!) 394 lb 3.2 oz (178.8 kg)    ASSESSMENT AND PLAN:  Discussed the following assessment and plan:  Problem List Items Addressed  This Visit       Musculoskeletal and Integument   Skin infection - Primary    Patient is afebrile, denies systemic features.  Unfortunately, she was unable to make it into the office today and I am unable to examine patient.  Virtual visit failed so we had visit as a phone call.  Long discussion with patient in regards to suspected differential diagnoseses in regards to recurrent abscess or cellulitis versus hidradenitis suppurativa versus intertrigo candidiasis.  I advised that had she been in person today we would have collected wound culture.  She did not complete Bactrim or Keflex course from 6 months ago.  I advised her in the future to complete the course so we can completely eradicate a skin infection.  Counseled on risk of bacterial resistance.  I prescribed her 7 days worth of Bactrim , clotrimazole, mupirocin.  Advised to start probiotics. counseled her on how to keep skin dry and to use antiseptic such as Dial soap.  Patient will let me know if skin lesions not completely resolved.  If recurs, have advised that I would like for her to see dermatology.  She politely declines today.         Relevant Medications   sulfamethoxazole-trimethoprim (BACTRIM DS) 800-160 MG tablet   Clotrimazole 1 % OINT   mupirocin ointment (BACTROBAN) 2 %    -we discussed possible serious and likely etiologies, options for evaluation and workup, limitations of telemedicine visit vs in person visit, treatment, treatment risks and precautions. Pt prefers  to treat via telemedicine empirically rather then risking or undertaking an in person visit at this moment.  .   I discussed the assessment and treatment plan with the patient. The patient was provided an opportunity to ask questions and all were answered. The patient agreed with the plan and demonstrated an understanding of the instructions.   The patient was advised to call back or seek an in-person evaluation if the symptoms worsen or if the condition fails  to improve as anticipated.  I have spent 16 minutes with a patient including precharting, exam, reviewing medical records, and discussion plan of care.     Rennie Plowman, FNP

## 2021-07-05 NOTE — Patient Instructions (Addendum)
Use Dial soap on areas where you commonly have boils.   I have provided you with mupirocin ointment which covers for MRSA. Please use this at the start of any lesion.   In particular for lesions on abdomen and under skin fold, this may be a yeast infection.  As we discussed, I have also prescribed an antifungal called metrazol for you to use as needed.  Please keep the area dry.  start bactrim 7 day course for suspected MRSA infection.  It is very important to complete a full 7-day course so we can adequately clear the infection and prevent recurrence.  Ensure to take probiotics while on antibiotics and also for 2 weeks after completion. This can either be by eating yogurt daily or taking a probiotic supplement over the counter such as Culturelle.It is important to re-colonize the gut with good bacteria and also to prevent any diarrheal infections associated with antibiotic use.   If rash or lesions recur, it is imperative that you come in person (not a virtual visit) that we can see the lesions himself, take pictures and culture.  Intertrigo Intertrigo is skin irritation or inflammation (dermatitis) that occurs when folds of skin rub together. The irritation can cause a rash and make skin raw and itchy. This condition most commonly occurs in the skin folds of these areas: Toes. Armpits. Groin. Under the belly. Under the breasts. Buttocks. Intertrigo is not passed from person to person (is not contagious). What are the causes? This condition is caused by heat, moisture, rubbing (friction), and not enough air circulation. The condition can be made worse by: Sweat. Bacteria. A fungus, such as yeast. What increases the risk? This condition is more likely to occur if you have moisture in your skin folds. You are more likely to develop this condition if you: Have diabetes. Are overweight. Are not able to move around or are not active. Live in a warm and moist climate. Wear splints, braces,  or other medical devices. Are not able to control your bowels or bladder (have incontinence). What are the signs or symptoms? Symptoms of this condition include: A pink or red skin rash in the skin fold or near the skin fold. Raw or scaly skin. Itchiness. A burning feeling. Bleeding. Leaking fluid. A bad smell. How is this diagnosed? This condition is diagnosed with a medical history and physical exam. You mayalso have a skin swab to test for bacteria or a fungus. How is this treated? This condition may be treated by: Cleaning and drying your skin. Taking an antibiotic medicine or using an antibiotic skin cream for a bacterial infection. Using an antifungal cream on your skin or taking pills for an infection that was caused by a fungus, such as yeast. Using a steroid ointment to relieve itchiness and irritation. Separating the skin fold with a clean cotton cloth to absorb moisture and allow air to flow into the area. Follow these instructions at home: Keep the affected area clean and dry. Do not scratch your skin. Stay in a cool environment as much as possible. Use an air conditioner or fan, if available. Apply over-the-counter and prescription medicines only as told by your health care provider. If you were prescribed an antibiotic medicine, use it as told by your health care provider. Do not stop using the antibiotic even if your condition improves. Keep all follow-up visits as told by your health care provider. This is important. How is this prevented?  Maintain a healthy weight. Take care of  your feet, especially if you have diabetes. Foot care includes: Wearing shoes that fit well. Keeping your feet dry. Wearing clean, breathable socks. Protect the skin around your groin and buttocks, especially if you have incontinence. Skin protection includes: Following a regular cleaning routine. Using skin protectant creams, powders, or ointments. Changing protection pads  frequently. Do not wear tight clothes. Wear clothes that are loose, absorbent, and made of cotton. Wear a bra that gives good support, if needed. Shower and dry yourself well after activity or exercise. Use a hair dryer on a cool setting to dry between skin folds, especially after you bathe. If you have diabetes, keep your blood sugar under control. Contact a health care provider if: Your symptoms do not improve with treatment. Your symptoms get worse or they spread. You notice increased redness and warmth. You have a fever. Summary Intertrigo is skin irritation or inflammation (dermatitis) that occurs when folds of skin rub together. This condition is caused by heat, moisture, rubbing (friction), and not enough air circulation. This condition may be treated by cleaning and drying your skin and with medicines. Apply over-the-counter and prescription medicines only as told by your health care provider. Keep all follow-up visits as told by your health care provider. This is important. This information is not intended to replace advice given to you by your health care provider. Make sure you discuss any questions you have with your healthcare provider. Document Revised: 04/08/2018 Document Reviewed: 04/15/2018 Elsevier Patient Education  2022 Elsevier Inc.    Skin Abscess  A skin abscess is an infected area of your skin that contains pus and other material. An abscess can happen in any part of your body. Some abscesses break open (rupture) on their own. Most continue to get worse unless they are treated. The infection can spread deeper into the body and into your blood, which can makeyou feel sick. A skin abscess is caused by germs that enter the skin through a cut or scrape. It can also be caused by blocked oil and sweat glands or infected hairfollicles. This condition is usually treated by: Draining the pus. Taking antibiotic medicines. Placing a warm, wet washcloth over the  abscess. Follow these instructions at home: Medicines  Take over-the-counter and prescription medicines only as told by your doctor. If you were prescribed an antibiotic medicine, take it as told by your doctor. Do not stop taking the antibiotic even if you start to feel better.  Abscess care  If you have an abscess that has not drained, place a warm, clean, wet washcloth over the abscess several times a day. Do this as told by your doctor. Follow instructions from your doctor about how to take care of your abscess. Make sure you: Cover the abscess with a bandage (dressing). Change your bandage or gauze as told by your doctor. Wash your hands with soap and water before you change the bandage or gauze. If you cannot use soap and water, use hand sanitizer. Check your abscess every day for signs that the infection is getting worse. Check for: More redness, swelling, or pain. More fluid or blood. Warmth. More pus or a bad smell.  General instructions To avoid spreading the infection: Do not share personal care items, towels, or hot tubs with others. Avoid making skin-to-skin contact with other people. Keep all follow-up visits as told by your doctor. This is important. Contact a doctor if: You have more redness, swelling, or pain around your abscess. You have more fluid or blood  coming from your abscess. Your abscess feels warm when you touch it. You have more pus or a bad smell coming from your abscess. You have a fever. Your muscles ache. You have chills. You feel sick. Get help right away if: You have very bad (severe) pain. You see red streaks on your skin spreading away from the abscess. Summary A skin abscess is an infected area of your skin that contains pus and other material. The abscess is caused by germs that enter the skin through a cut or scrape. It can also be caused by blocked oil and sweat glands or infected hair follicles. Follow your doctor's instructions on  caring for your abscess, taking medicines, preventing infections, and keeping follow-up visits. This information is not intended to replace advice given to you by your health care provider. Make sure you discuss any questions you have with your healthcare provider. Document Revised: 06/19/2019 Document Reviewed: 12/27/2017 Elsevier Patient Education  2022 [employer].

## 2021-07-05 NOTE — Assessment & Plan Note (Signed)
Patient is afebrile, denies systemic features.  Unfortunately, she was unable to make it into the office today and I am unable to examine patient.  Virtual visit failed so we had visit as a phone call.  Long discussion with patient in regards to suspected differential diagnoseses in regards to recurrent abscess or cellulitis versus hidradenitis suppurativa versus intertrigo candidiasis.  I advised that had she been in person today we would have collected wound culture.  She did not complete Bactrim or Keflex course from 6 months ago.  I advised her in the future to complete the course so we can completely eradicate a skin infection.  Counseled on risk of bacterial resistance.  I prescribed her 7 days worth of Bactrim , clotrimazole, mupirocin.  Advised to start probiotics. counseled her on how to keep skin dry and to use antiseptic such as Dial soap.  Patient will let me know if skin lesions not completely resolved.  If recurs, have advised that I would like for her to see dermatology.  She politely declines today.

## 2021-11-07 ENCOUNTER — Encounter: Payer: Self-pay | Admitting: Adult Health

## 2021-11-07 ENCOUNTER — Telehealth (INDEPENDENT_AMBULATORY_CARE_PROVIDER_SITE_OTHER): Payer: Self-pay | Admitting: Adult Health

## 2021-11-07 VITALS — Ht 70.0 in

## 2021-11-07 DIAGNOSIS — Z87898 Personal history of other specified conditions: Secondary | ICD-10-CM

## 2021-11-07 DIAGNOSIS — Z8614 Personal history of Methicillin resistant Staphylococcus aureus infection: Secondary | ICD-10-CM

## 2021-11-07 DIAGNOSIS — U071 COVID-19: Secondary | ICD-10-CM | POA: Insufficient documentation

## 2021-11-07 DIAGNOSIS — R051 Acute cough: Secondary | ICD-10-CM

## 2021-11-07 HISTORY — DX: Acute cough: R05.1

## 2021-11-07 HISTORY — DX: COVID-19: U07.1

## 2021-11-07 MED ORDER — IBUPROFEN 800 MG PO TABS: 800.0000 mg | ORAL_TABLET | Freq: Three times a day (TID) | ORAL | 0 refills | Status: DC | PRN

## 2021-11-07 MED ORDER — PSEUDOEPH-BROMPHEN-DM 30-2-10 MG/5ML PO SYRP: 5.0000 mL | ORAL_SOLUTION | Freq: Four times a day (QID) | ORAL | 0 refills | Status: DC | PRN

## 2021-11-07 MED ORDER — ALBUTEROL SULFATE HFA 108 (90 BASE) MCG/ACT IN AERS
2.0000 | INHALATION_SPRAY | Freq: Four times a day (QID) | RESPIRATORY_TRACT | 0 refills | Status: DC | PRN
Start: 2021-11-07 — End: 2023-06-14

## 2021-11-07 NOTE — Progress Notes (Signed)
Virtual Visit via Video Note  I connected with Regina Snyder on 11/07/21 at  8:30 AM EST by a video enabled telemedicine application and verified that I am speaking with the correct person using two identifiers.  Location: Patient: at home  Provider: Provider: Provider's office at  [facility], [place].      I discussed the limitations of evaluation and management by telemedicine and the availability of in person appointments. The patient expressed understanding and agreed to proceed.    I discussed the assessment and treatment plan with the patient. The patient was provided an opportunity to ask questions and all were answered. The patient agreed with the plan and demonstrated an understanding of the instructions.   The patient was advised to call back or seek an in-person evaluation if the symptoms worsen or if the condition fails to improve as anticipated.  I provided  20 minutes of non-face-to-face time during this encounter.   Jairo Ben, FNP   Subjective:    Patient ID: Regina Snyder, female    DOB: September 06, 1976, 45 y.o.   MRN: [id number]  Chief Complaint  Patient presents with   Covid Positive    Pt tested positive for covid 12/7 at home and on a PCR test. Pt symptoms started 12/5. Pt having symptoms of a headache, sinus drainage and productive cough    HPI Patient is in today for  history of Covid positive on 11/02/21. Started with Covid symptoms 10/28/21 with fever, chills and congestion. Symptoms have improved, still has cough and some mild congestion.fever was high 102 at onset. Now no temperature up to or over 100.4.  Mild wheezing.  10/28/21 was seen at urgent care for history of MRSA and new skin lesions, she had blotching of her face as well. This has resolved.  Was given prednisone and Bactrim, she 4  tablets left. Has improved.  She has one spot right upper arm that is healing.   Patient  denies any fever, body  aches,chills, rash, chest pain, shortness of breath, nausea, vomiting, or diarrhea.  .Denies dizziness, lightheadedness, pre syncopal or syncopal episodes.    Past Medical History:  Diagnosis Date   Allergy     Past Surgical History:  Procedure Laterality Date   eyelid surgery      Family History  Problem Relation Age of Onset   Diabetes Mellitus II Brother     Social History   Socioeconomic History   Marital status: Unknown    Spouse name: Not on file   Number of children: Not on file   Years of education: Not on file   Highest education level: Not on file  Occupational History   Not on file  Tobacco Use   Smoking status: Never   Smokeless tobacco: Never  Substance and Sexual Activity   Alcohol use: Never   Drug use: Never   Sexual activity: Yes  Other Topics Concern   Not on file  Social History Narrative   Not on file   Social Determinants of Health   Financial Resource Strain: Not on file  Food Insecurity: Not on file  Transportation Needs: Not on file  Physical Activity: Not on file  Stress: Not on file  Social Connections: Not on file  Intimate Partner Violence: Not on file    Outpatient Medications Prior to Visit  Medication Sig Dispense Refill   Clotrimazole 1 % OINT Apply 1 application topically 2 (two) times daily. Apply to  lesions on abdomen and under skin fold. External use only 56.7 g 1   [employer] ANTISEPTIC SKIN CLEANSER 4 % SOLN Apply 1 Bottle topically daily. 237 mL 1   sulfamethoxazole-trimethoprim (BACTRIM DS) 800-160 MG tablet Take 1 tablet by mouth 2 (two) times daily.     meloxicam (MOBIC) 15 MG tablet Take 15 mg by mouth daily.     mupirocin ointment (BACTROBAN) 2 % Apply thin layer to area of concern as needed. (Patient not taking: Reported on 11/07/2021) 22 g 1   No facility-administered medications prior to visit.    Allergies  Allergen Reactions   Azithromycin Other (See Comments)   Codeine Nausea Only and Nausea And Vomiting    Doxycycline Rash    Review of Systems  Constitutional:  Positive for fatigue. Negative for activity change, appetite change, chills, diaphoresis and fever.  HENT:  Positive for rhinorrhea. Negative for congestion, ear pain, facial swelling, sinus pressure, sinus pain and sore throat.   Respiratory:  Positive for cough and wheezing. Negative for apnea, choking, chest tightness, shortness of breath and stridor.   Genitourinary: Negative.   Musculoskeletal: Negative.   Skin:        Right abscess of arm treated at [facility] still on Bactrim       Objective:    Physical Exam  No vital signs available.  Patient is alert and oriented and responsive to questions Engages in conversation with provider. Speaks in full sentences without any pauses without any shortness of breath or distress. No erythema visible around right upper arm skin abscess, difficult to examine on video.    Ht 5\' 10"  (1.778 m)   BMI 55.24 kg/m  Wt Readings from Last 3 Encounters:  08/30/20 (!) 385 lb (174.6 kg)  06/29/20 (!) 391 lb (177.4 kg)  06/17/20 (!) 394 lb 3.2 oz (178.8 kg)    Health Maintenance Due  Topic Date Due   Hepatitis C Screening  Never done   TETANUS/TDAP  Never done   PAP SMEAR-Modifier  Never done   COLONOSCOPY (Pts 45-25yrs Insurance coverage will need to be confirmed)  Never done   COVID-19 Vaccine (3 - Booster for Pfizer series) 04/09/2020   INFLUENZA VACCINE  06/27/2021    There are no preventive care reminders to display for this patient.   Lab Results  Component Value Date   TSH 1.600 08/30/2020   Lab Results  Component Value Date   WBC 6.2 08/30/2020   HGB 11.9 08/30/2020   HCT 38.0 08/30/2020   MCV 83 08/30/2020   PLT 270 08/30/2020   Lab Results  Component Value Date   NA 139 08/30/2020   K 4.5 08/30/2020   CO2 24 08/30/2020   GLUCOSE 93 08/30/2020   BUN 12 08/30/2020   CREATININE 0.78 08/30/2020   BILITOT 0.7 08/30/2020   ALKPHOS 84 08/30/2020   AST 14  08/30/2020   ALT 18 08/30/2020   PROT 6.9 08/30/2020   ALBUMIN 4.1 08/30/2020   CALCIUM 9.6 08/30/2020   ANIONGAP 6 03/21/2020   Lab Results  Component Value Date   CHOL 159 08/30/2020   Lab Results  Component Value Date   HDL 62 08/30/2020   Lab Results  Component Value Date   LDLCALC 87 08/30/2020   Lab Results  Component Value Date   TRIG 49 08/30/2020   Lab Results  Component Value Date   CHOLHDL 2.6 08/30/2020   Lab Results  Component Value Date   HGBA1C 5.8 (H) 03/19/2020  Assessment & Plan:   Problem List Items Addressed This Visit       Other   History of MRSA infection   Relevant Medications   sulfamethoxazole-trimethoprim (BACTRIM DS) 800-160 MG tablet   COVID   Relevant Medications   sulfamethoxazole-trimethoprim (BACTRIM DS) 800-160 MG tablet   Other Relevant Orders   CBC with Differential/Platelet   Comprehensive metabolic panel   DG Chest 2 View   History of headache - Primary   Relevant Medications   ibuprofen (ADVIL) 800 MG tablet   Other Relevant Orders   TSH   Acute cough   Relevant Medications   albuterol (VENTOLIN HFA) 108 (90 Base) MCG/ACT inhaler   brompheniramine-pseudoephedrine-DM 30-2-10 MG/5ML syrup  She request Ibuprofen 800 mg she says it is cheaper, she takes maye 1- 2 per month for chronic uncomplicated headache. CMP advised.    Medications sent for cough.  Chest x ray and labs ordered.    Right arm skin abscess seems to be healing, advised in person visit if any symptoms persisting or reoccur after bactrim completed.   Overdue for labs. None done at [facility] clinic. Call to schedule.  Work note sent to [employer] 11/07/21 to 11-14-21.  Return in about 1 week (around 11/14/2021), or if symptoms worsen or fail to improve, for at any time for any worsening symptoms, Go to Emergency room/ urgent care if worse.     Meds ordered this encounter  Medications   ibuprofen (ADVIL) 800 MG tablet    Sig: Take 1 tablet  (800 mg total) by mouth every 8 (eight) hours as needed for moderate pain.    Dispense:  30 tablet    Refill:  0   albuterol (VENTOLIN HFA) 108 (90 Base) MCG/ACT inhaler    Sig: Inhale 2 puffs into the lungs every 6 (six) hours as needed for wheezing or shortness of breath.    Dispense:  8 g    Refill:  0   brompheniramine-pseudoephedrine-DM 30-2-10 MG/5ML syrup    Sig: Take 5 mLs by mouth 4 (four) times daily as needed.    Dispense:  473 mL    Refill:  0     Advised in person evaluation at anytime is advised if any symptoms do not improve, worsen or change at any given time.  Red Flags discussed. The patient was given clear instructions to go to ER or return to medical center if any red flags develop, symptoms do not improve, worsen or new problems develop. They verbalized understanding.   Jairo Ben, FNP

## 2021-11-07 NOTE — Patient Instructions (Addendum)
Albuterol Metered Dose Inhaler (MDI) What is this medication? ALBUTEROL (al Gaspar Bidding) treats lung diseases, such as asthma, where the airways in the lungs narrow, causing breathing problems or wheezing (bronchospasm). It is also used to treat asthma or prevent breathing problems during exercise. This medication works by opening the airways of the lungs, making it easier to breathe. It is often called a rescue- or quick-relief inhaler. This medicine may be used for other purposes; ask your health care provider or pharmacist if you have questions. COMMON BRAND NAME(S): Proair HFA, Proventil, Proventil HFA, Respirol, Ventolin, Ventolin HFA What should I tell my care team before I take this medication? They need to know if you have any of these conditions: Diabetes (high blood sugar) Heart disease High blood pressure Irregular heartbeat or rhythm Pheochromocytoma Seizures Thyroid disease An unusual or allergic reaction to albuterol, other medications, foods, dyes, or preservatives Pregnant or trying to get pregnant Breast-feeding How should I use this medication? This medication is inhaled through the mouth. Take it as directed on the prescription label. Do not use it more often than directed. This medication comes with INSTRUCTIONS FOR USE. Ask your pharmacist for directions on how to use this medication. Read the information carefully. Talk to your pharmacist or care team if you have questions. Talk to your care team about the use of this medication in children. While it may be given to children for selected conditions, precautions do apply. Overdosage: If you think you have taken too much of this medicine contact a poison control center or emergency room at once. NOTE: This medicine is only for you. Do not share this medicine with others. What if I miss a dose? If you take this medication on a regular basis, take it as soon as you can. If it is almost time for your next dose, take only  that dose. Do not take double or extra doses. What may interact with this medication? Caffeine Chloroquine Cisapride Diuretics Medications for colds Medications for depression or for emotional or psychotic conditions Medications for weight loss including some herbal products Methadone Pentamidine Some antibiotics like clarithromycin, erythromycin, levofloxacin, and linezolid Some heart medications Steroid hormones like dexamethasone, cortisone, hydrocortisone Theophylline Thyroid hormones This list may not describe all possible interactions. Give your health care provider a list of all the medicines, herbs, non-prescription drugs, or dietary supplements you use. Also tell them if you smoke, drink alcohol, or use illegal drugs. Some items may interact with your medicine. What should I watch for while using this medication? Visit your care team for regular checks on your progress. Tell your care team if your symptoms do not start to get better or if they get worse. If your symptoms get worse or if you are using this medication more than normal, call your care team right away. Do not treat yourself for coughs, colds or allergies without asking your care team for advice. Some nonprescription medications can affect this one. You and your care team should develop an Asthma Action Plan that is just for you. Be sure to know what to do if you are in the yellow (asthma is getting worse) or red (medical alert) zones. Your mouth may get dry. Chewing sugarless gum or sucking hard candy and drinking plenty of water may help. Contact your health care provider if the problem does not go away or is severe. What side effects may I notice from receiving this medication? Side effects that you should report to your care team as  soon as possible: Allergic reactions--skin rash, itching, hives, swelling of the face, lips, tongue, or throat Heart rhythm changes--fast or irregular heartbeat, dizziness, feeling faint  or lightheaded, chest pain, trouble breathing Increase in blood pressure Muscle pain or cramps Wheezing or trouble breathing that is worse after use Side effects that usually do not require medical attention (report to your care team if they continue or are bothersome): Change in taste Dry mouth Headache Sore throat Tremors or shaking Trouble sleeping This list may not describe all possible side effects. Call your doctor for medical advice about side effects. You may report side effects to FDA at 1-800-FDA-1088. Where should I keep my medication? Keep out of the reach of children and pets. Store at room temperature between 20 and 25 degrees C (68 and 77 degrees F). Keep inhaler away from extreme heat and cold. Get rid of it when the dose counter reads 0 or after the expiration date, whichever is first. To get rid of medications that are no longer needed or have expired: Take the medication to a medication take-back program. Check with your pharmacy or law enforcement to find a location. If you cannot return the medication, ask your care team how to get rid of this medication safely. NOTE: This sheet is a summary. It may not cover all possible information. If you have questions about this medicine, talk to your doctor, pharmacist, or health care provider.  2022 Elsevier/Gold Standard (2020-10-29 00:00:00) Brompheniramine; Pseudoephedrine; Dextromethorphan oral solution What is this medication? BROMPHENIRAMINE; PSEUDOEPHEDRINE; DEXTROMETHORPHAN (brome fen IR a meen; soo doe e FED rin; dex troe meth OR fan) is a combination of an antihistamine, decongestant, and cough suppressant. It is used to treat cough and the symptoms of allergy and colds. This medicine will not treat an infection. This medicine may be used for other purposes; ask your health care provider or pharmacist if you have questions. COMMON BRAND NAME(S): Anaplex, Anaplex DM, Andehist DM, Andehist DM NR, Brom/PSE/DM Cough,  Bromaline DM, Bromatane DX, Bromaxefed DM RF, Bromdex D, Brometane DX, Bromfed-DM, Bromophed DX, Bromphenex DM, Bromplex DM, Brotapp-DM, BroveX PSB DM, Carbofed DM, Cardec DM, Dallergy DM, Decon DM, Dimetane DX, Dimetapp Children's DM Cold and Cough, Dynatuss, EndaCof-DM, LoHist PSB DM, Myphetane DX, Neo DM, PBM Allergy, Q-Tapp DM, Robitussin Cough and Allergy, Rondamine DM, Rondec DM, Sildec DM, Tuss Mine DM What should I tell my care team before I take this medication? They need to know if you have any of these conditions: blockage in your bowels diabetes (high blood sugar) glaucoma heart disease high blood pressure kidney disease lung or breathing disease (asthma, COPD) prostate disease stomach ulcers, other stomach or intestine problems taken an MAOI such as Marplan, Nardil, or Parnate in last 14 days thyroid disease an unusual or allergic reaction to brompheniramine, pseudoephedrine, dextromethorphan, other medicines, foods, dyes, or preservatives pregnant or trying to get pregnant breast-feeding How should I use this medication? Take this medicine by mouth. Take it as directed on the label. Use a specially marked oral syringe, spoon, or dropper to measure each dose. Ask your pharmacist if you do not have one. Household spoons are not accurate. Do not take it more often than directed. Talk to your health care provider about the use of this medicine in children. While this drug may be prescribed for children as young as 2 years for selected conditions, precautions do apply. Patients over 71 years of age may have a stronger reaction to this medicine and need smaller doses. Overdosage:  If you think you have taken too much of this medicine contact a poison control center or emergency room at once. NOTE: This medicine is only for you. Do not share this medicine with others. What if I miss a dose? This does not apply. This medicine is not for regular use. It should only be used as  needed. What may interact with this medication? Do not take this medicine with any of the following medications: ergot alkaloids like dihydroergotamine, ergonovine, ergotamine, methylergonovine MAOIs like Marplan, Nardil, and Parnate ozanimod This medicine may also interact with the following medications: alcohol certain medicines for anxiety or sleep certain medicines for depression like amitriptyline, fluoxetine, sertraline certain medicines for seizures like phenobarbital, primidone general anesthetics like halothane, isoflurane, methoxyflurane, propofol medicines for blood pressure medicines that relax muscles for surgery narcotic medicines for pain other antihistamines for allergy, cough, and cold phenothiazines like chlorpromazine, mesoridazine, prochlorperazine, thioridazine This list may not describe all possible interactions. Give your health care provider a list of all the medicines, herbs, non-prescription drugs, or dietary supplements you use. Also tell them if you smoke, drink alcohol, or use illegal drugs. Some items may interact with your medicine. What should I watch for while using this medication? Visit your health care provider for regular checks on your progress. Tell your health care provider if your symptoms do not start to get better or if they get worse. You may get drowsy or dizzy. Do not drive, use machinery, or do anything that needs mental alertness until you know how this medicine affects you. Do not stand or sit up quickly, especially if you are an older patient. This reduces the risk of dizzy or fainting spells. Alcohol may interfere with the effect of this medicine. Avoid alcoholic drinks. Your mouth may get dry. Chewing sugarless gum or sucking hard candy and drinking plenty of water may help. Contact your health care provider if the problem does not go away or is severe. What side effects may I notice from receiving this medication? Side effects that you  should report to your doctor or health care professional as soon as possible: allergic reactions (skin rash, itching or hives, swelling of the face, lips, or tongue) increase in blood pressure seizures serotonin syndrome (irritable; confusion; diarrhea; fast or irregular heartbeat; muscle twitching; stiff muscles; trouble walking; sweating; high fever; seizures; chills; vomiting) trouble passing urine Side effects that usually do not require medical attention (report these to your doctor or health care professional if they continue or are bothersome): anxious dizziness dry mouth trouble sleeping unusually weak or tired This list may not describe all possible side effects. Call your doctor for medical advice about side effects. You may report side effects to FDA at 1-800-FDA-1088. Where should I keep my medication? Keep out of the reach of children and pets. Store at room temperature between 20 and 25 degrees C (68 and 77 degrees F). Get rid of any unused medicine after the expiration date. To get rid of medicines that are no longer needed or have expired: Take the medicine to a medicine take-back program. Check with your pharmacy or law enforcement to find a location. If you cannot return the medicine, check the label or package insert to see if the medicine should be thrown out in the garbage or flushed down the toilet. If you are not sure, ask your health care provider. If it is safe to put it in the trash, pour the medicine out of the container. Mix the medicine with  cat litter, dirt, coffee grounds, or other unwanted substance. Seal the mixture in a bag or container. Put it in the trash. NOTE: This sheet is a summary. It may not cover all possible information. If you have questions about this medicine, talk to your doctor, pharmacist, or health care provider.  2022 Elsevier/Gold Standard (2020-07-09 00:00:00) 10 Things You Can Do to Manage Your COVID-19 Symptoms at Home If you have  possible or confirmed COVID-19 Stay home except to get medical care. Monitor your symptoms carefully. If your symptoms get worse, call your healthcare provider immediately. Get rest and stay hydrated. If you have a medical appointment, call the healthcare provider ahead of time and tell them that you have or may have COVID-19. For medical emergencies, call 911 and notify the dispatch personnel that you have or may have COVID-19. Cover your cough and sneezes with a tissue or use the inside of your elbow. Wash your hands often with soap and water for at least 20 seconds or clean your hands with an alcohol-based hand sanitizer that contains at least 60% alcohol. As much as possible, stay in a specific room and away from other people in your home. Also, you should use a separate bathroom, if available. If you need to be around other people in or outside of the home, wear a mask. Avoid sharing personal items with other people in your household, like dishes, towels, and bedding. Clean all surfaces that are touched often, like counters, tabletops, and doorknobs. Use household cleaning sprays or wipes according to the label instructions. [email or web address] 06/11/2020 This information is not intended to replace advice given to you by your health care provider. Make sure you discuss any questions you have with your health care provider. Document Revised: 08/05/2021 Document Reviewed: 08/05/2021 Elsevier Patient Education  2022 [employer].

## 2021-12-02 ENCOUNTER — Other Ambulatory Visit: Payer: Self-pay | Admitting: Adult Health

## 2021-12-02 DIAGNOSIS — R051 Acute cough: Secondary | ICD-10-CM

## 2022-03-14 ENCOUNTER — Other Ambulatory Visit: Payer: Self-pay

## 2022-03-14 ENCOUNTER — Emergency Department
Admission: EM | Admit: 2022-03-14 | Discharge: 2022-03-14 | Disposition: A | Discharge status: Home / Self Care | Payer: BC Managed Care – PPO | Attending: Emergency Medicine | Admitting: Emergency Medicine

## 2022-03-14 DIAGNOSIS — Z23 Encounter for immunization: Secondary | ICD-10-CM | POA: Insufficient documentation

## 2022-03-14 DIAGNOSIS — W5501XA Bitten by cat, initial encounter: Secondary | ICD-10-CM | POA: Insufficient documentation

## 2022-03-14 DIAGNOSIS — S51852A Open bite of left forearm, initial encounter: Secondary | ICD-10-CM | POA: Diagnosis present

## 2022-03-14 MED ORDER — TETANUS-DIPHTH-ACELL PERTUSSIS 5-2.5-18.5 LF-MCG/0.5 IM SUSY
0.5000 mL | PREFILLED_SYRINGE | Freq: Once | INTRAMUSCULAR | Status: AC
  Administered 2022-03-14: 0.5 mL via INTRAMUSCULAR
  Filled 2022-03-14: qty 0.5

## 2022-03-14 MED ORDER — AMOXICILLIN-POT CLAVULANATE 875-125 MG PO TABS: 1.0000 | ORAL_TABLET | Freq: Two times a day (BID) | ORAL | 0 refills | Status: AC

## 2022-03-14 NOTE — ED Provider Notes (Signed)
? ?[facility] ?Provider Note ? ? ? Event Date/Time  ? First MD Initiated Contact with Patient 03/14/22 1116   ?  (approximate) ? ? ?History  ? ?Animal Bite ? ? ?HPI ? ?SEIDY TRENTHAM is a 46 y.o. female presents emergency department after cat bite.  Patient's niece had picked up a cat on the side of the road while back that has been living in her house.  The patient was bitten by the cat.  Cat does not appear to be sick.  However patient will call animal control to have them watch For rabies.  Unsure of her last Tdap. ? ?  ? ? ?Physical Exam  ? ?Triage Vital Signs: ?ED Triage Vitals  ?Enc Vitals Group  ?   BP 03/14/22 1117 (!) 138/95  ?   Pulse Rate 03/14/22 1117 84  ?   Resp 03/14/22 1117 20  ?   Temp 03/14/22 1117 98.4 ?F (36.9 ?C)  ?   Temp Source 03/14/22 1117 Oral  ?   SpO2 03/14/22 1117 95 %  ?   Weight 03/14/22 1115 (!) 510 lb (231.3 kg)  ?   Height 03/14/22 1115 5\' 10"  (1.778 m)  ?   Head Circumference --   ?   Peak Flow --   ?   Pain Score 03/14/22 1112 0  ?   Pain Loc --   ?   Pain Edu? --   ?   Excl. in [place]? --   ? ? ?Most recent vital signs: ?Vitals:  ? 03/14/22 1117  ?BP: (!) 138/95  ?Pulse: 84  ?Resp: 20  ?Temp: 98.4 ?F (36.9 ?C)  ?SpO2: 95%  ? ? ? ?General: Awake, no distress.   ?CV:  Good peripheral perfusion. regular rate and  rhythm ?Resp:  Normal effort.  ?Abd:  No distention.   ?Other:  Several Puncture wounds noted on left forearm, no drainage, no surrounding erythema ? ? ?ED Results / Procedures / Treatments  ? ?Labs ?(all labs ordered are listed, but only abnormal results are displayed) ?Labs Reviewed - No data to display ? ? ?EKG ? ? ? ? ?RADIOLOGY ? ? ? ? ?PROCEDURES: ? ? ?Procedures ? ? ?MEDICATIONS ORDERED IN ED: ?Medications  ?Tdap (BOOSTRIX) injection 0.5 mL (has no administration in time range)  ? ? ? ?IMPRESSION / MDM / ASSESSMENT AND PLAN / ED COURSE  ?I reviewed the triage vital signs and the nursing notes. ?             ?               ? ?Differential diagnosis  includes, but is not limited to, cat bite, cellulitis, wound infection ? ?The wound appears to be healing but there is small redness noted at the actual puncture wounds, will place patient on Augmentin 875 twice daily.  Patient is to follow-up with her regular doctor if this is not improving in 5 days.  Return if worsening.  Tdap was updated here in the ED.  Patient was instructed to notify animal control so that the cat can be watched for 10 days.  Explained to her that if Begins to appear sick within the next 10 days she will need rabies vaccinations.  I think to have the cat so evaluation of the cat is not an issue.  Patient was discharged in stable condition. ? ? ? ? ?  ? ? ?FINAL CLINICAL IMPRESSION(S) / ED DIAGNOSES  ? ?Final diagnoses:  ?  Cat bite, initial encounter  ? ? ? ?Rx / DC Orders  ? ?ED Discharge Orders   ? ?      Ordered  ?  amoxicillin-clavulanate (AUGMENTIN) 875-125 MG tablet  2 times daily       ? 03/14/22 1116  ? ?  ?  ? ?  ? ? ? ?Note:  This document was prepared using Dragon voice recognition software and may include unintentional dictation errors. ? ?  ?Versie Starks, PA-C ?03/14/22 1120 ? ?  ?Lucrezia Starch, MD ?03/14/22 1429 ? ?

## 2022-03-14 NOTE — ED Triage Notes (Signed)
Pt was bitten by stray cat Sunday, has 3 puncture marks to left forearm. Pt does have the cat, is unsure of vaccinations.  ?

## 2022-03-14 NOTE — ED Notes (Signed)
Patient discharged to home per MD order. Patient in stable condition, and deemed medically cleared by ED provider for discharge. Discharge instructions reviewed with patient/family using "Teach Back"; verbalized understanding of medication education and administration, and information about follow-up care. Denies further concerns. ° °

## 2022-03-14 NOTE — Discharge Instructions (Addendum)
Follow-up with your primary care office if you see any sign of infection that is not resolving with Augmentin.  Return the emergency department if animal control states that the cat has rabies.  At that time he would need rabies vaccinations.  They will watch the cat for 10 days to determine if the cat has rabies. ?

## 2022-09-25 ENCOUNTER — Encounter (INDEPENDENT_AMBULATORY_CARE_PROVIDER_SITE_OTHER): Payer: Self-pay

## 2023-05-21 ENCOUNTER — Telehealth: Payer: Self-pay

## 2023-05-21 NOTE — Telephone Encounter (Signed)
Please let patient know she will not be considered a patient until she is seen and establishes as a new patient  Thanks   Copied from CRM [id number]. Topic: Appointment Scheduling - Scheduling Inquiry for Clinic >> May 21, 2023 10:38 AM Franchot Heidelberg wrote: Reason for CRM: Pt wants to be assigned to Dr. Jacquenette Shone but does not want an appt, advised that she hasn't been seen since 2022.

## 2023-06-13 ENCOUNTER — Emergency Department
Admission: EM | Admit: 2023-06-13 | Discharge: 2023-06-14 | Disposition: A | Discharge status: Other Acute Inpatient Hospital | Payer: BC Managed Care – PPO | Attending: Emergency Medicine | Admitting: Emergency Medicine

## 2023-06-13 ENCOUNTER — Emergency Department: Payer: BC Managed Care – PPO

## 2023-06-13 ENCOUNTER — Other Ambulatory Visit: Payer: Self-pay

## 2023-06-13 DIAGNOSIS — W108XXA Fall (on) (from) other stairs and steps, initial encounter: Secondary | ICD-10-CM | POA: Insufficient documentation

## 2023-06-13 DIAGNOSIS — W19XXXA Unspecified fall, initial encounter: Secondary | ICD-10-CM

## 2023-06-13 DIAGNOSIS — S0083XA Contusion of other part of head, initial encounter: Secondary | ICD-10-CM | POA: Insufficient documentation

## 2023-06-13 DIAGNOSIS — S4291XA Fracture of right shoulder girdle, part unspecified, initial encounter for closed fracture: Secondary | ICD-10-CM | POA: Insufficient documentation

## 2023-06-13 DIAGNOSIS — S0512XA Contusion of eyeball and orbital tissues, left eye, initial encounter: Secondary | ICD-10-CM | POA: Insufficient documentation

## 2023-06-13 DIAGNOSIS — S42411A Displaced simple supracondylar fracture without intercondylar fracture of right humerus, initial encounter for closed fracture: Secondary | ICD-10-CM | POA: Diagnosis present

## 2023-06-13 DIAGNOSIS — S4991XA Unspecified injury of right shoulder and upper arm, initial encounter: Secondary | ICD-10-CM | POA: Diagnosis present

## 2023-06-13 MED ORDER — SODIUM CHLORIDE 0.9 % IV BOLUS
1000.0000 mL | Freq: Once | INTRAVENOUS | Status: AC
  Administered 2023-06-13: 1000 mL via INTRAVENOUS

## 2023-06-13 MED ORDER — FENTANYL CITRATE PF 50 MCG/ML IJ SOSY
100.0000 ug | PREFILLED_SYRINGE | Freq: Once | INTRAMUSCULAR | Status: AC
  Administered 2023-06-13: 100 ug via INTRAVENOUS
  Filled 2023-06-13: qty 2

## 2023-06-13 MED ORDER — ONDANSETRON 8 MG PO TBDP
8.0000 mg | ORAL_TABLET | Freq: Once | ORAL | Status: AC
  Administered 2023-06-13: 8 mg via ORAL
  Filled 2023-06-13: qty 1

## 2023-06-13 MED ORDER — HYDROMORPHONE HCL 1 MG/ML IJ SOLN
1.0000 mg | Freq: Once | INTRAMUSCULAR | Status: AC
  Administered 2023-06-13: 1 mg via INTRAVENOUS
  Filled 2023-06-13: qty 1

## 2023-06-13 MED ORDER — ONDANSETRON HCL 4 MG/2ML IJ SOLN
4.0000 mg | Freq: Once | INTRAMUSCULAR | Status: AC
  Administered 2023-06-13: 4 mg via INTRAVENOUS
  Filled 2023-06-13: qty 2

## 2023-06-13 MED ORDER — SODIUM CHLORIDE 0.9 % IV BOLUS
1000.0000 mL | Freq: Once | INTRAVENOUS | Status: AC
  Administered 2023-06-14: 1000 mL via INTRAVENOUS

## 2023-06-13 MED ORDER — OXYCODONE-ACETAMINOPHEN 5-325 MG PO TABS
1.0000 | ORAL_TABLET | Freq: Once | ORAL | Status: AC
  Administered 2023-06-13: 1 via ORAL
  Filled 2023-06-13: qty 1

## 2023-06-13 NOTE — ED Notes (Signed)
Patient to radiology at this time.

## 2023-06-13 NOTE — ED Notes (Addendum)
Patient appears to  have hematoma to the left side of head

## 2023-06-13 NOTE — ED Provider Notes (Incomplete)
[facility] Provider Note  Patient Contact: 9:05 PM (approximate)   History   Fall   HPI  Regina Snyder is a 47 y.o. female who presents the emergency department via EMS after a fall.  Patient states that she was on a porch that is roughly 1-2 steps high, holding onto a leash for her dog when the dog took off.  Patient states that the leash pulled her off of the porch landing on her anterior side.  Patient did hit her head, does not believe she lost consciousness.  Patient is having significant pain to her forehead, right shoulder.  Patient is also complaining of some left hand pain.  Denies any neck, chest, abdominal, back, hips.  She is currently complaining of left knee pain.  Denies any open lacerations.  Patient has endorsing 10 out of 10 pain right now.  She states that her right shoulder is the worst, feels like it is "no longer connected all the way through the bone."     Physical Exam   Triage Vital Signs: ED Triage Vitals  Encounter Vitals Group     BP 06/13/23 1943 (!) 109/53     Systolic BP Percentile --      Diastolic BP Percentile --      Pulse Rate 06/13/23 1943 93     Resp 06/13/23 1943 (!) 22     Temp 06/13/23 1943 98.3 F (36.8 C)     Temp Source 06/13/23 1943 Oral     SpO2 06/13/23 1936 98 %     Weight 06/13/23 1941 (!) 427 lb (193.7 kg)     Height 06/13/23 1941 5\' 10"  (1.778 m)     Head Circumference --      Peak Flow --      Pain Score 06/13/23 1940 10     Pain Loc --      Pain Education --      Exclude from Growth Chart --     Most recent vital signs: Vitals:   06/13/23 2255 06/13/23 2258  BP: (!) 122/96   Pulse: 85   Resp: (!) 22   Temp:    SpO2: 94% 97%     General: Alert and in no acute distress. Eyes:  PERRL. EOMI. Head: Large hematoma with abrasion noted to the left forehead.  No open wounds.  Patient is very tender in this area.  No other significant edema or ecchymosis.  No battle signs.  Patient is starting  to have left periorbital ecchymosis but no evidence of raccoon eyes.  No serosanguineous fluid drainage from the ears or nares.  Neck: No stridor. No cervical spine tenderness to palpation  Cardiovascular:  Good peripheral perfusion Respiratory: Normal respiratory effort without tachypnea or retractions. Lungs CTAB. Good air entry to the bases with no decreased or absent breath sounds. Musculoskeletal: Full range of motion to all extremities.  Visualization of the right upper extremity reveals no obvious signs of deformity but patient has no range of motion.  Exquisite tenderness about the proximal humerus/humeral head region.  There is no appreciable palpable abnormality.  No passive range of motion performed.  Pulses sensation intact distally.  Examination of the left hand reveals slight ecchymosis about the fourth knuckle.  There is no open wounds.  No deformity.  Full range of motion to the hand.  Examination of the bilateral knees reveals no obvious signs of deformity.  There is abrasion noted superficially to the left knee.  Full range  of motion and patient is amatory on both legs at this time. Neurologic:  No gross focal neurologic deficits are appreciated.  Cranial nerves II through XII grossly intact. Skin:   No rash noted Other:   ED Results / Procedures / Treatments   Labs (all labs ordered are listed, but only abnormal results are displayed) Labs Reviewed - No data to display   EKG     RADIOLOGY  I personally viewed, evaluated, and interpreted these images as part of my medical decision making, as well as reviewing the written report by the radiologist.  ED Provider Interpretation: Evaluation of the imaging reveals no acute traumatic findings to the head or neck, no injuries to the hands or knee.  Patient has a comminuted, oblique, significantly displaced fracture of the humeral head and proximal shaft.  It appears that there is partial subluxation as well.  I discussed the  images with on-call orthopedics surgeon, Dr. Joice Lofts  CT HUMERUS RIGHT WO CONTRAST  Result Date: 06/13/2023 CLINICAL DATA:  Fall, right shoulder injury EXAM: CT OF THE RIGHT HUMERUS WITHOUT CONTRAST TECHNIQUE: Multidetector CT imaging was performed according to the standard protocol. Multiplanar CT image reconstructions were also generated. RADIATION DOSE REDUCTION: This exam was performed according to the departmental dose-optimization program which includes automated exposure control, adjustment of the mA and/or kV according to patient size and/or use of iterative reconstruction technique. COMPARISON:  None Available. FINDINGS: Bones/Joint/Cartilage There is a markedly comminuted fracture of the proximal right humerus. Fractures within the right humeral head involving the greater tuberosity, lesser tuberosity, anatomical neck and surgical neck. There is impaction of the articular surface of the humeral head with resultant articular incongruity involving the regions adjacent to the greater tuberosity and lesser tuberosity. There is inferior subluxation of the articular surface of the humeral head in relation of the glenoid fossa without frank dislocation. There is, additionally, an oblique fracture of the proximal right femoral diaphysis distally with 1 shaft with medial displacement and 2-3 cm override of the humeral shaft. Visualized clavicle is intact. The scapula is intact. Mild acromioclavicular degenerative arthritis. Limited evaluation of the right thoracic cage is unremarkable. Ligaments Suboptimally assessed by CT. Muscles and Tendons Soft tissue evaluation is limited by beam attenuation and underpenetration. Grossly unremarkable. Soft tissues Unremarkable IMPRESSION: 1. Markedly comminuted fracture of the proximal right humerus as described above. 2. Oblique fracture of the proximal right humeral diaphysis with 1 shaft with medial displacement and 2-3 cm override of the humeral shaft. Electronically  Signed   By: Helyn Numbers M.D.   On: 06/13/2023 22:03   DG Shoulder Right  Result Date: 06/13/2023 CLINICAL DATA:  Trauma, fall EXAM: RIGHT SHOULDER - 2+ VIEW COMPARISON:  None Available. FINDINGS: Limited single AP view is submitted for review. The severely comminuted fracture in the head, neck and proximal shaft of right humerus. There is 2.3 cm offset in alignment of distal fracture fragments. Possibility of dislocation could not be evaluated. If clinically warranted, follow-up CT may be considered. IMPRESSION: Severely comminuted displaced fracture is seen in the head, neck and proximal shaft of right humerus. Possibility of dislocation could not be evaluated. If clinically warranted, follow-up CT of right shoulder may be considered. Electronically Signed   By: Ernie Avena M.D.   On: 06/13/2023 21:08   DG Knee Complete 4 Views Left  Result Date: 06/13/2023 CLINICAL DATA:  Trauma, fall, pain EXAM: LEFT KNEE - COMPLETE 4+ VIEW COMPARISON:  None Available. FINDINGS: No recent fracture or dislocation is  seen in left knee. Small bony spurs are noted in the medial compartment. There is narrowing of joint space in the medial compartment. IMPRESSION: No recent fracture or dislocation is seen in left knee. Degenerative changes are noted in the medial compartment. Electronically Signed   By: Ernie Avena M.D.   On: 06/13/2023 21:06   DG Hand Complete Left  Result Date: 06/13/2023 CLINICAL DATA:  Trauma, fall, pain EXAM: LEFT HAND - COMPLETE 3+ VIEW COMPARISON:  None Available. FINDINGS: Monitoring devices is partly obscuring the distal phalanx of index finger. No displaced fracture or dislocation is seen. No opaque foreign bodies are seen. IMPRESSION: No fracture or dislocation is seen in left hand. Electronically Signed   By: Ernie Avena M.D.   On: 06/13/2023 21:05   DG Elbow Complete Right  Result Date: 06/13/2023 CLINICAL DATA:  Arm pain after fall EXAM: RIGHT ELBOW - COMPLETE  3+ VIEW COMPARISON:  None Available. FINDINGS: Subtle lucency through the coronoid process of the ulna. Otherwise no acute fracture. No elbow joint effusion. Soft tissues are unremarkable. IMPRESSION: Subtle lucency through the coronoid process of the ulna is favored chronic or projectional with acute fracture considered less likely given lack of elbow joint effusion. If this correlates with site of pain, CT is recommended. Electronically Signed   By: Minerva Fester M.D.   On: 06/13/2023 21:04   CT Head Wo Contrast  Result Date: 06/13/2023 CLINICAL DATA:  Fall EXAM: CT HEAD WITHOUT CONTRAST TECHNIQUE: Contiguous axial images were obtained from the base of the skull through the vertex without intravenous contrast. RADIATION DOSE REDUCTION: This exam was performed according to the departmental dose-optimization program which includes automated exposure control, adjustment of the mA and/or kV according to patient size and/or use of iterative reconstruction technique. COMPARISON:  None Available. FINDINGS: Brain: There is no mass, hemorrhage or extra-axial collection. The size and configuration of the ventricles and extra-axial CSF spaces are normal. The brain parenchyma is normal, without acute or chronic infarction. Small focus of pineal calcification. Mega cisterna magna. Vascular: No abnormal hyperdensity of the major intracranial arteries or dural venous sinuses. No intracranial atherosclerosis. Skull: Left frontal scalp hematoma without skull fracture. Sinuses/Orbits: No fluid levels or advanced mucosal thickening of the visualized paranasal sinuses. No mastoid or middle ear effusion. The orbits are normal. IMPRESSION: 1. No acute intracranial abnormality. 2. Left frontal scalp hematoma without skull fracture. Electronically Signed   By: Deatra Robinson M.D.   On: 06/13/2023 20:33   CT Cervical Spine Wo Contrast  Result Date: 06/13/2023 CLINICAL DATA:  Larey Seat down stairs EXAM: CT CERVICAL SPINE WITHOUT  CONTRAST TECHNIQUE: Multidetector CT imaging of the cervical spine was performed without intravenous contrast. Multiplanar CT image reconstructions were also generated. RADIATION DOSE REDUCTION: This exam was performed according to the departmental dose-optimization program which includes automated exposure control, adjustment of the mA and/or kV according to patient size and/or use of iterative reconstruction technique. COMPARISON:  None Available. FINDINGS: Alignment: No subluxation.  Facet alignment within normal limits. Skull base and vertebrae: No acute fracture. No primary bone lesion or focal pathologic process. Soft tissues and spinal canal: No prevertebral fluid or swelling. No visible canal hematoma. Disc levels: Multilevel degenerative change with mild to moderate disc space narrowing C5-C6 and C6-C7. Upper chest: Negative. Other: None IMPRESSION: Degenerative changes. No CT evidence for acute osseous abnormality. Electronically Signed   By: Jasmine Pang M.D.   On: 06/13/2023 20:31    PROCEDURES:  Critical Care performed: No  Procedures   MEDICATIONS ORDERED IN ED: Medications  sodium chloride 0.9 % bolus 1,000 mL (has no administration in time range)  ondansetron (ZOFRAN-ODT) disintegrating tablet 8 mg (8 mg Oral Given 06/13/23 2003)  oxyCODONE-acetaminophen (PERCOCET/ROXICET) 5-325 MG per tablet 1 tablet (1 tablet Oral Given 06/13/23 2002)  ondansetron (ZOFRAN) injection 4 mg (4 mg Intravenous Given 06/13/23 2156)  fentaNYL (SUBLIMAZE) injection 100 mcg (100 mcg Intravenous Given 06/13/23 2155)  sodium chloride 0.9 % bolus 1,000 mL (1,000 mLs Intravenous New Bag/Given 06/13/23 2156)  HYDROmorphone (DILAUDID) injection 1 mg (1 mg Intravenous Given 06/13/23 2255)     IMPRESSION / MDM / ASSESSMENT AND PLAN / ED COURSE  I reviewed the triage vital signs and the nursing notes.                                 Differential diagnosis includes, but is not limited to, fall, concussion,  skull fracture, intracranial hemorrhage, cervical spine injury, shoulder dislocation, shoulder fracture, hand fracture, hand contusion, knee contusion, patellar fracture   Patient's presentation is most consistent with acute presentation with potential threat to life or bodily function.   Patient's diagnosis is consistent with fall, comminuted displaced fracture of the shoulder.  Patient presents the emergency department after being tripped off her porch by her dog.  Patient landed face forward.  She did hit her head but reportedly did not lose consciousness.  She arrives with abrasion and large hematoma to the left forehead, periorbital ecchymosis of the left eye and significant pain and inability to use the right upper extremity.  Patient is neurovascularly intact to all 4 extremities.  Cranial nerves were intact.  Imaging was obtained and majority of imaging was reassuring with the exception of her right shoulder.  Patient has a comminuted displaced fracture involving the humeral head into the shaft.  I reached out to our on-call orthopedic surgeon and he reviewed the imaging including CT scan.  Given the nature of this fracture he recommends transfer to tertiary center for repair. Patient is given ED precautions to return to the ED for any worsening or new symptoms.     FINAL CLINICAL IMPRESSION(S) / ED DIAGNOSES   Final diagnoses:  Closed fracture of right shoulder, initial encounter  Fall, initial encounter     Rx / DC Orders   ED Discharge Orders     None        Note:  This document was prepared using Dragon voice recognition software and may include unintentional dictation errors.

## 2023-06-13 NOTE — ED Provider Notes (Signed)
[facility] Provider Note  Patient Contact: 9:05 PM (approximate)   History   Fall   HPI  Regina Snyder is a 47 y.o. female who presents the emergency department via EMS after a fall.  Patient states that she was on a porch that is roughly 1-2 steps high, holding onto a leash for her dog when the dog took off.  Patient states that the leash pulled her off of the porch landing on her anterior side.  Patient did hit her head, does not believe she lost consciousness.  Patient is having significant pain to her forehead, right shoulder.  Patient is also complaining of some left hand pain.  Denies any neck, chest, abdominal, back, hips.  She is currently complaining of left knee pain.  Denies any open lacerations.  Patient has endorsing 10 out of 10 pain right now.  She states that her right shoulder is the worst, feels like it is "no longer connected all the way through the bone."     Physical Exam   Triage Vital Signs: ED Triage Vitals  Encounter Vitals Group     BP 06/13/23 1943 (!) 109/53     Systolic BP Percentile --      Diastolic BP Percentile --      Pulse Rate 06/13/23 1943 93     Resp 06/13/23 1943 (!) 22     Temp 06/13/23 1943 98.3 F (36.8 C)     Temp Source 06/13/23 1943 Oral     SpO2 06/13/23 1936 98 %     Weight 06/13/23 1941 (!) 427 lb (193.7 kg)     Height 06/13/23 1941 5\' 10"  (1.778 m)     Head Circumference --      Peak Flow --      Pain Score 06/13/23 1940 10     Pain Loc --      Pain Education --      Exclude from Growth Chart --     Most recent vital signs: Vitals:   06/13/23 2255 06/13/23 2258  BP: (!) 122/96   Pulse: 85   Resp: (!) 22   Temp:    SpO2: 94% 97%     General: Alert and in no acute distress. Eyes:  PERRL. EOMI. Head: Large hematoma with abrasion noted to the left forehead.  No open wounds.  Patient is very tender in this area.  No other significant edema or ecchymosis.  No battle signs.  Patient is starting  to have left periorbital ecchymosis but no evidence of raccoon eyes.  No serosanguineous fluid drainage from the ears or nares.  Neck: No stridor. No cervical spine tenderness to palpation  Cardiovascular:  Good peripheral perfusion Respiratory: Normal respiratory effort without tachypnea or retractions. Lungs CTAB. Good air entry to the bases with no decreased or absent breath sounds. Musculoskeletal: Full range of motion to all extremities.  Visualization of the right upper extremity reveals no obvious signs of deformity but patient has no range of motion.  Exquisite tenderness about the proximal humerus/humeral head region.  There is no appreciable palpable abnormality.  No passive range of motion performed.  Pulses sensation intact distally.  Examination of the left hand reveals slight ecchymosis about the fourth knuckle.  There is no open wounds.  No deformity.  Full range of motion to the hand.  Examination of the bilateral knees reveals no obvious signs of deformity.  There is abrasion noted superficially to the left knee.  Full range  of motion and patient is amatory on both legs at this time. Neurologic:  No gross focal neurologic deficits are appreciated.  Cranial nerves II through XII grossly intact. Skin:   No rash noted Other:   ED Results / Procedures / Treatments   Labs (all labs ordered are listed, but only abnormal results are displayed) Labs Reviewed - No data to display   EKG     RADIOLOGY  I personally viewed, evaluated, and interpreted these images as part of my medical decision making, as well as reviewing the written report by the radiologist.  ED Provider Interpretation: Evaluation of the imaging reveals no acute traumatic findings to the head or neck, no injuries to the hands or knee.  Patient has a comminuted, oblique, significantly displaced fracture of the humeral head and proximal shaft.  It appears that there is partial subluxation as well.  I discussed the  images with on-call orthopedics surgeon, Dr. Joice Lofts  CT HUMERUS RIGHT WO CONTRAST  Result Date: 06/13/2023 CLINICAL DATA:  Fall, right shoulder injury EXAM: CT OF THE RIGHT HUMERUS WITHOUT CONTRAST TECHNIQUE: Multidetector CT imaging was performed according to the standard protocol. Multiplanar CT image reconstructions were also generated. RADIATION DOSE REDUCTION: This exam was performed according to the departmental dose-optimization program which includes automated exposure control, adjustment of the mA and/or kV according to patient size and/or use of iterative reconstruction technique. COMPARISON:  None Available. FINDINGS: Bones/Joint/Cartilage There is a markedly comminuted fracture of the proximal right humerus. Fractures within the right humeral head involving the greater tuberosity, lesser tuberosity, anatomical neck and surgical neck. There is impaction of the articular surface of the humeral head with resultant articular incongruity involving the regions adjacent to the greater tuberosity and lesser tuberosity. There is inferior subluxation of the articular surface of the humeral head in relation of the glenoid fossa without frank dislocation. There is, additionally, an oblique fracture of the proximal right femoral diaphysis distally with 1 shaft with medial displacement and 2-3 cm override of the humeral shaft. Visualized clavicle is intact. The scapula is intact. Mild acromioclavicular degenerative arthritis. Limited evaluation of the right thoracic cage is unremarkable. Ligaments Suboptimally assessed by CT. Muscles and Tendons Soft tissue evaluation is limited by beam attenuation and underpenetration. Grossly unremarkable. Soft tissues Unremarkable IMPRESSION: 1. Markedly comminuted fracture of the proximal right humerus as described above. 2. Oblique fracture of the proximal right humeral diaphysis with 1 shaft with medial displacement and 2-3 cm override of the humeral shaft. Electronically  Signed   By: Helyn Numbers M.D.   On: 06/13/2023 22:03   DG Shoulder Right  Result Date: 06/13/2023 CLINICAL DATA:  Trauma, fall EXAM: RIGHT SHOULDER - 2+ VIEW COMPARISON:  None Available. FINDINGS: Limited single AP view is submitted for review. The severely comminuted fracture in the head, neck and proximal shaft of right humerus. There is 2.3 cm offset in alignment of distal fracture fragments. Possibility of dislocation could not be evaluated. If clinically warranted, follow-up CT may be considered. IMPRESSION: Severely comminuted displaced fracture is seen in the head, neck and proximal shaft of right humerus. Possibility of dislocation could not be evaluated. If clinically warranted, follow-up CT of right shoulder may be considered. Electronically Signed   By: Ernie Avena M.D.   On: 06/13/2023 21:08   DG Knee Complete 4 Views Left  Result Date: 06/13/2023 CLINICAL DATA:  Trauma, fall, pain EXAM: LEFT KNEE - COMPLETE 4+ VIEW COMPARISON:  None Available. FINDINGS: No recent fracture or dislocation is  seen in left knee. Small bony spurs are noted in the medial compartment. There is narrowing of joint space in the medial compartment. IMPRESSION: No recent fracture or dislocation is seen in left knee. Degenerative changes are noted in the medial compartment. Electronically Signed   By: Ernie Avena M.D.   On: 06/13/2023 21:06   DG Hand Complete Left  Result Date: 06/13/2023 CLINICAL DATA:  Trauma, fall, pain EXAM: LEFT HAND - COMPLETE 3+ VIEW COMPARISON:  None Available. FINDINGS: Monitoring devices is partly obscuring the distal phalanx of index finger. No displaced fracture or dislocation is seen. No opaque foreign bodies are seen. IMPRESSION: No fracture or dislocation is seen in left hand. Electronically Signed   By: Ernie Avena M.D.   On: 06/13/2023 21:05   DG Elbow Complete Right  Result Date: 06/13/2023 CLINICAL DATA:  Arm pain after fall EXAM: RIGHT ELBOW - COMPLETE  3+ VIEW COMPARISON:  None Available. FINDINGS: Subtle lucency through the coronoid process of the ulna. Otherwise no acute fracture. No elbow joint effusion. Soft tissues are unremarkable. IMPRESSION: Subtle lucency through the coronoid process of the ulna is favored chronic or projectional with acute fracture considered less likely given lack of elbow joint effusion. If this correlates with site of pain, CT is recommended. Electronically Signed   By: Minerva Fester M.D.   On: 06/13/2023 21:04   CT Head Wo Contrast  Result Date: 06/13/2023 CLINICAL DATA:  Fall EXAM: CT HEAD WITHOUT CONTRAST TECHNIQUE: Contiguous axial images were obtained from the base of the skull through the vertex without intravenous contrast. RADIATION DOSE REDUCTION: This exam was performed according to the departmental dose-optimization program which includes automated exposure control, adjustment of the mA and/or kV according to patient size and/or use of iterative reconstruction technique. COMPARISON:  None Available. FINDINGS: Brain: There is no mass, hemorrhage or extra-axial collection. The size and configuration of the ventricles and extra-axial CSF spaces are normal. The brain parenchyma is normal, without acute or chronic infarction. Small focus of pineal calcification. Mega cisterna magna. Vascular: No abnormal hyperdensity of the major intracranial arteries or dural venous sinuses. No intracranial atherosclerosis. Skull: Left frontal scalp hematoma without skull fracture. Sinuses/Orbits: No fluid levels or advanced mucosal thickening of the visualized paranasal sinuses. No mastoid or middle ear effusion. The orbits are normal. IMPRESSION: 1. No acute intracranial abnormality. 2. Left frontal scalp hematoma without skull fracture. Electronically Signed   By: Deatra Robinson M.D.   On: 06/13/2023 20:33   CT Cervical Spine Wo Contrast  Result Date: 06/13/2023 CLINICAL DATA:  Larey Seat down stairs EXAM: CT CERVICAL SPINE WITHOUT  CONTRAST TECHNIQUE: Multidetector CT imaging of the cervical spine was performed without intravenous contrast. Multiplanar CT image reconstructions were also generated. RADIATION DOSE REDUCTION: This exam was performed according to the departmental dose-optimization program which includes automated exposure control, adjustment of the mA and/or kV according to patient size and/or use of iterative reconstruction technique. COMPARISON:  None Available. FINDINGS: Alignment: No subluxation.  Facet alignment within normal limits. Skull base and vertebrae: No acute fracture. No primary bone lesion or focal pathologic process. Soft tissues and spinal canal: No prevertebral fluid or swelling. No visible canal hematoma. Disc levels: Multilevel degenerative change with mild to moderate disc space narrowing C5-C6 and C6-C7. Upper chest: Negative. Other: None IMPRESSION: Degenerative changes. No CT evidence for acute osseous abnormality. Electronically Signed   By: Jasmine Pang M.D.   On: 06/13/2023 20:31    PROCEDURES:  Critical Care performed: No  Procedures   MEDICATIONS ORDERED IN ED: Medications  sodium chloride 0.9 % bolus 1,000 mL (has no administration in time range)  ondansetron (ZOFRAN-ODT) disintegrating tablet 8 mg (8 mg Oral Given 06/13/23 2003)  oxyCODONE-acetaminophen (PERCOCET/ROXICET) 5-325 MG per tablet 1 tablet (1 tablet Oral Given 06/13/23 2002)  ondansetron (ZOFRAN) injection 4 mg (4 mg Intravenous Given 06/13/23 2156)  fentaNYL (SUBLIMAZE) injection 100 mcg (100 mcg Intravenous Given 06/13/23 2155)  sodium chloride 0.9 % bolus 1,000 mL (1,000 mLs Intravenous New Bag/Given 06/13/23 2156)  HYDROmorphone (DILAUDID) injection 1 mg (1 mg Intravenous Given 06/13/23 2255)     IMPRESSION / MDM / ASSESSMENT AND PLAN / ED COURSE  I reviewed the triage vital signs and the nursing notes.                                 Differential diagnosis includes, but is not limited to, fall, concussion,  skull fracture, intracranial hemorrhage, cervical spine injury, shoulder dislocation, shoulder fracture, hand fracture, hand contusion, knee contusion, patellar fracture   Patient's presentation is most consistent with acute presentation with potential threat to life or bodily function.   Patient's diagnosis is consistent with fall, comminuted displaced fracture of the shoulder.  Patient presents the emergency department after being tripped off her porch by her dog.  Patient landed face forward.  She did hit her head but reportedly did not lose consciousness.  She arrives with abrasion and large hematoma to the left forehead, periorbital ecchymosis of the left eye and significant pain and inability to use the right upper extremity.  Patient is neurovascularly intact to all 4 extremities.  Cranial nerves were intact.  Imaging was obtained and majority of imaging was reassuring with the exception of her right shoulder.  Patient has a comminuted displaced fracture involving the humeral head into the shaft.  I reached out to our on-call orthopedic surgeon and he reviewed the imaging including CT scan.  Given the nature of this fracture he recommends transfer to tertiary center for repair. Patient is given ED precautions to return to the ED for any worsening or new symptoms.  I reached out to [facility] who agrees to accept the patient for repair with orthopedics.  Patient will be transferred via EMS to [facility].     FINAL CLINICAL IMPRESSION(S) / ED DIAGNOSES   Final diagnoses:  Closed fracture of right shoulder, initial encounter  Fall, initial encounter     Rx / DC Orders   ED Discharge Orders     None        Note:  This document was prepared using Dragon voice recognition software and may include unintentional dictation errors.   Lanette Hampshire 06/14/23 0120    Minna Antis, MD 06/14/23 [id number]

## 2023-06-13 NOTE — ED Triage Notes (Signed)
Per [facility], patient came from home after a fall down one stair outside face-first. Patient appears to have a hematoma on the right forehead area and c/o r shoulder and upper arm pain. Patient denies loss of consciousness and  not on blood thinners. Patient is currently nauseous.

## 2023-06-13 NOTE — ED Notes (Signed)
Patient to return from CT at this time

## 2023-06-14 ENCOUNTER — Encounter (HOSPITAL_COMMUNITY): Payer: Self-pay

## 2023-06-14 ENCOUNTER — Inpatient Hospital Stay (HOSPITAL_COMMUNITY)
Admission: EM | Admit: 2023-06-14 | Discharge: 2023-06-20 | DRG: 493 | Disposition: A | Discharge status: Home Health | Payer: BC Managed Care – PPO | Source: Other Acute Inpatient Hospital | Attending: Family Medicine | Admitting: Family Medicine

## 2023-06-14 ENCOUNTER — Encounter (HOSPITAL_COMMUNITY): Payer: Self-pay | Admitting: Orthopedic Surgery

## 2023-06-14 DIAGNOSIS — D649 Anemia, unspecified: Secondary | ICD-10-CM | POA: Diagnosis present

## 2023-06-14 DIAGNOSIS — Z885 Allergy status to narcotic agent status: Secondary | ICD-10-CM

## 2023-06-14 DIAGNOSIS — W109XXA Fall (on) (from) unspecified stairs and steps, initial encounter: Secondary | ICD-10-CM | POA: Diagnosis present

## 2023-06-14 DIAGNOSIS — I890 Lymphedema, not elsewhere classified: Secondary | ICD-10-CM | POA: Diagnosis present

## 2023-06-14 DIAGNOSIS — Z6841 Body Mass Index (BMI) 40.0 and over, adult: Secondary | ICD-10-CM | POA: Diagnosis not present

## 2023-06-14 DIAGNOSIS — S42201A Unspecified fracture of upper end of right humerus, initial encounter for closed fracture: Secondary | ICD-10-CM

## 2023-06-14 DIAGNOSIS — E876 Hypokalemia: Secondary | ICD-10-CM | POA: Diagnosis not present

## 2023-06-14 DIAGNOSIS — Z833 Family history of diabetes mellitus: Secondary | ICD-10-CM | POA: Diagnosis not present

## 2023-06-14 DIAGNOSIS — Y92018 Other place in single-family (private) house as the place of occurrence of the external cause: Secondary | ICD-10-CM

## 2023-06-14 DIAGNOSIS — S8002XA Contusion of left knee, initial encounter: Secondary | ICD-10-CM | POA: Diagnosis present

## 2023-06-14 DIAGNOSIS — Z881 Allergy status to other antibiotic agents status: Secondary | ICD-10-CM

## 2023-06-14 DIAGNOSIS — S42421A Displaced comminuted supracondylar fracture without intercondylar fracture of right humerus, initial encounter for closed fracture: Secondary | ICD-10-CM

## 2023-06-14 DIAGNOSIS — R200 Anesthesia of skin: Secondary | ICD-10-CM | POA: Diagnosis not present

## 2023-06-14 DIAGNOSIS — E662 Morbid (severe) obesity with alveolar hypoventilation: Secondary | ICD-10-CM | POA: Diagnosis present

## 2023-06-14 DIAGNOSIS — S0003XA Contusion of scalp, initial encounter: Secondary | ICD-10-CM | POA: Diagnosis present

## 2023-06-14 DIAGNOSIS — S42411A Displaced simple supracondylar fracture without intercondylar fracture of right humerus, initial encounter for closed fracture: Secondary | ICD-10-CM | POA: Diagnosis present

## 2023-06-14 DIAGNOSIS — S42301A Unspecified fracture of shaft of humerus, right arm, initial encounter for closed fracture: Secondary | ICD-10-CM

## 2023-06-14 DIAGNOSIS — S42291A Other displaced fracture of upper end of right humerus, initial encounter for closed fracture: Secondary | ICD-10-CM

## 2023-06-14 DIAGNOSIS — S4291XA Fracture of right shoulder girdle, part unspecified, initial encounter for closed fracture: Secondary | ICD-10-CM | POA: Diagnosis not present

## 2023-06-14 DIAGNOSIS — R110 Nausea: Secondary | ICD-10-CM | POA: Diagnosis not present

## 2023-06-14 LAB — BASIC METABOLIC PANEL
Anion gap: 7 (ref 5–15)
BUN: 11 mg/dL (ref 6–20)
CO2: 26 mmol/L (ref 22–32)
Calcium: 8.3 mg/dL — ABNORMAL LOW (ref 8.9–10.3)
Chloride: 103 mmol/L (ref 98–111)
Creatinine, Ser: 0.68 mg/dL (ref 0.44–1.00)
GFR, Estimated: >60 mL/min (ref >60)
Glucose, Bld: 136 mg/dL — ABNORMAL HIGH (ref 70–99)
Potassium: 3.7 mmol/L (ref 3.5–5.1)
Sodium: 136 mmol/L (ref 135–145)

## 2023-06-14 LAB — CBC WITH DIFFERENTIAL/PLATELET
Abs Immature Granulocytes: 0.03 10*3/uL (ref 0.00–0.07)
Basophils Absolute: 0 10*3/uL (ref 0.0–0.1)
Basophils Relative: 0 %
Eosinophils Absolute: 0 10*3/uL (ref 0.0–0.5)
Eosinophils Relative: 0 %
HCT: 36.2 % (ref 36.0–46.0)
Hemoglobin: 10.9 g/dL — ABNORMAL LOW (ref 12.0–15.0)
Immature Granulocytes: 0 %
Lymphocytes Relative: 7 %
Lymphs Abs: 0.8 10*3/uL (ref 0.7–4.0)
MCH: 26 pg (ref 26.0–34.0)
MCHC: 30.1 g/dL (ref 30.0–36.0)
MCV: 86.2 fL (ref 80.0–100.0)
Monocytes Absolute: 0.6 10*3/uL (ref 0.1–1.0)
Monocytes Relative: 6 %
Neutro Abs: 9.6 10*3/uL — ABNORMAL HIGH (ref 1.7–7.7)
Neutrophils Relative %: 87 %
Platelets: 208 10*3/uL (ref 150–400)
RBC: 4.2 MIL/uL (ref 3.87–5.11)
RDW: 15.1 % (ref 11.5–15.5)
WBC: 11.1 10*3/uL — ABNORMAL HIGH (ref 4.0–10.5)
nRBC: 0 % (ref 0.0–0.2)

## 2023-06-14 LAB — CBC
HCT: 33.6 % — ABNORMAL LOW (ref 36.0–46.0)
Hemoglobin: 10.5 g/dL — ABNORMAL LOW (ref 12.0–15.0)
MCH: 26.6 pg (ref 26.0–34.0)
MCHC: 31.3 g/dL (ref 30.0–36.0)
MCV: 85.1 fL (ref 80.0–100.0)
Platelets: 196 10*3/uL (ref 150–400)
RBC: 3.95 MIL/uL (ref 3.87–5.11)
RDW: 15.1 % (ref 11.5–15.5)
WBC: 7.5 10*3/uL (ref 4.0–10.5)
nRBC: 0 % (ref 0.0–0.2)

## 2023-06-14 LAB — COMPREHENSIVE METABOLIC PANEL
ALT: 23 U/L (ref 0–44)
AST: 15 U/L (ref 15–41)
Albumin: 2.9 g/dL — ABNORMAL LOW (ref 3.5–5.0)
Alkaline Phosphatase: 55 U/L (ref 38–126)
Anion gap: 6 (ref 5–15)
BUN: 7 mg/dL (ref 6–20)
CO2: 26 mmol/L (ref 22–32)
Calcium: 8.2 mg/dL — ABNORMAL LOW (ref 8.9–10.3)
Chloride: 102 mmol/L (ref 98–111)
Creatinine, Ser: 0.6 mg/dL (ref 0.44–1.00)
GFR, Estimated: >60 mL/min (ref >60)
Glucose, Bld: 127 mg/dL — ABNORMAL HIGH (ref 70–99)
Potassium: 3.6 mmol/L (ref 3.5–5.1)
Sodium: 134 mmol/L — ABNORMAL LOW (ref 135–145)
Total Bilirubin: 1.1 mg/dL (ref 0.3–1.2)
Total Protein: 6.1 g/dL — ABNORMAL LOW (ref 6.5–8.1)

## 2023-06-14 LAB — HIV ANTIBODY (ROUTINE TESTING W REFLEX): HIV Screen 4th Generation wRfx: NONREACTIVE

## 2023-06-14 LAB — PROTIME-INR
INR: 1.1 (ref 0.8–1.2)
Prothrombin Time: 14.7 seconds (ref 11.4–15.2)

## 2023-06-14 LAB — APTT: aPTT: 22 seconds — ABNORMAL LOW (ref 24–36)

## 2023-06-14 MED ORDER — DOCUSATE SODIUM 100 MG PO CAPS: 100.0000 mg | ORAL_CAPSULE | Freq: Two times a day (BID) | ORAL | Status: DC

## 2023-06-14 MED ORDER — ALBUTEROL SULFATE (2.5 MG/3ML) 0.083% IN NEBU: 2.5000 mg | INHALATION_SOLUTION | Freq: Four times a day (QID) | RESPIRATORY_TRACT | Status: DC | PRN

## 2023-06-14 MED ORDER — ONDANSETRON HCL 4 MG/2ML IJ SOLN
INTRAMUSCULAR | Status: AC
  Administered 2023-06-14: 4 mg via INTRAVENOUS
  Filled 2023-06-14: qty 2

## 2023-06-14 MED ORDER — LACTATED RINGERS IV SOLN: INTRAVENOUS | Status: AC

## 2023-06-14 MED ORDER — BISACODYL 10 MG RE SUPP: 10.0000 mg | Freq: Every day | RECTAL | Status: DC | PRN

## 2023-06-14 MED ORDER — OXYCODONE HCL 5 MG PO TABS: 10.0000 mg | ORAL_TABLET | ORAL | Status: DC | PRN

## 2023-06-14 MED ORDER — ONDANSETRON HCL 4 MG/2ML IJ SOLN
4.0000 mg | Freq: Four times a day (QID) | INTRAMUSCULAR | Status: DC | PRN
  Administered 2023-06-14 (×2): 4 mg via INTRAVENOUS
  Filled 2023-06-14 (×3): qty 2

## 2023-06-14 MED ORDER — HYDRALAZINE HCL 20 MG/ML IJ SOLN: 10.0000 mg | Freq: Three times a day (TID) | INTRAMUSCULAR | Status: DC | PRN

## 2023-06-14 MED ORDER — HYDROMORPHONE HCL 1 MG/ML IJ SOLN
0.5000 mg | INTRAMUSCULAR | Status: DC | PRN
  Filled 2023-06-14: qty 1

## 2023-06-14 MED ORDER — SODIUM CHLORIDE 0.9% FLUSH
3.0000 mL | Freq: Two times a day (BID) | INTRAVENOUS | Status: DC
  Administered 2023-06-14 – 2023-06-19 (×6): 3 mL via INTRAVENOUS

## 2023-06-14 MED ORDER — ONDANSETRON HCL 4 MG/2ML IJ SOLN: 4.0000 mg | Freq: Once | INTRAMUSCULAR | Status: AC

## 2023-06-14 MED ORDER — HYDROMORPHONE HCL 1 MG/ML IJ SOLN
1.0000 mg | INTRAMUSCULAR | Status: DC | PRN
  Administered 2023-06-14 – 2023-06-17 (×6): 1 mg via INTRAVENOUS
  Filled 2023-06-14 (×7): qty 1

## 2023-06-14 MED ORDER — SODIUM CHLORIDE 0.9% FLUSH
3.0000 mL | Freq: Two times a day (BID) | INTRAVENOUS | Status: DC
  Administered 2023-06-14 – 2023-06-20 (×9): 3 mL via INTRAVENOUS

## 2023-06-14 MED ORDER — ACETAMINOPHEN 500 MG PO TABS
1000.0000 mg | ORAL_TABLET | Freq: Four times a day (QID) | ORAL | Status: DC
  Administered 2023-06-14: 1000 mg via ORAL
  Filled 2023-06-14: qty 2

## 2023-06-14 MED ORDER — ACETAMINOPHEN 325 MG PO TABS: 325.0000 mg | ORAL_TABLET | Freq: Four times a day (QID) | ORAL | Status: DC | PRN

## 2023-06-14 MED ORDER — SODIUM CHLORIDE 0.9% FLUSH: 3.0000 mL | INTRAVENOUS | Status: DC | PRN

## 2023-06-14 MED ORDER — ACETAMINOPHEN 650 MG RE SUPP: 650.0000 mg | Freq: Four times a day (QID) | RECTAL | Status: DC | PRN

## 2023-06-14 MED ORDER — HYDROMORPHONE HCL 1 MG/ML IJ SOLN
1.0000 mg | Freq: Once | INTRAMUSCULAR | Status: AC
  Administered 2023-06-14: 1 mg via INTRAVENOUS
  Filled 2023-06-14: qty 1

## 2023-06-14 MED ORDER — OXYCODONE HCL 5 MG PO TABS
10.0000 mg | ORAL_TABLET | Freq: Four times a day (QID) | ORAL | Status: DC
  Administered 2023-06-14 (×2): 10 mg via ORAL
  Administered 2023-06-15 – 2023-06-16 (×4): 15 mg via ORAL
  Administered 2023-06-16 – 2023-06-17 (×8): 10 mg via ORAL
  Administered 2023-06-18: 15 mg via ORAL
  Filled 2023-06-14: qty 2
  Filled 2023-06-14: qty 3
  Filled 2023-06-14: qty 2
  Filled 2023-06-14: qty 3
  Filled 2023-06-14: qty 2
  Filled 2023-06-14: qty 3
  Filled 2023-06-14 (×7): qty 2
  Filled 2023-06-14 (×2): qty 3
  Filled 2023-06-14: qty 2

## 2023-06-14 MED ORDER — SODIUM CHLORIDE 0.9 % IV SOLN: 250.0000 mL | INTRAVENOUS | Status: DC | PRN

## 2023-06-14 MED ORDER — ACETAMINOPHEN 325 MG PO TABS
650.0000 mg | ORAL_TABLET | Freq: Four times a day (QID) | ORAL | Status: DC | PRN
  Administered 2023-06-14 – 2023-06-19 (×4): 650 mg via ORAL
  Filled 2023-06-14 (×4): qty 2

## 2023-06-14 MED ORDER — CEFAZOLIN IN SODIUM CHLORIDE 3-0.9 GM/100ML-% IV SOLN: 3.0000 g | INTRAVENOUS | Status: AC

## 2023-06-14 MED ORDER — TRANEXAMIC ACID-NACL 1000-0.7 MG/100ML-% IV SOLN: 1000.0000 mg | INTRAVENOUS | Status: AC

## 2023-06-14 MED ORDER — OXYCODONE HCL 5 MG PO TABS: 5.0000 mg | ORAL_TABLET | ORAL | Status: DC | PRN

## 2023-06-14 MED ORDER — ONDANSETRON HCL 4 MG PO TABS
4.0000 mg | ORAL_TABLET | Freq: Four times a day (QID) | ORAL | Status: DC | PRN
  Filled 2023-06-14: qty 1

## 2023-06-14 MED ORDER — METHOCARBAMOL 1000 MG/10ML IJ SOLN: 500.0000 mg | Freq: Four times a day (QID) | INTRAVENOUS | Status: DC | PRN

## 2023-06-14 MED ORDER — SODIUM CHLORIDE 0.9 % IV SOLN: INTRAVENOUS | Status: DC

## 2023-06-14 MED ORDER — METHOCARBAMOL 500 MG PO TABS
500.0000 mg | ORAL_TABLET | Freq: Four times a day (QID) | ORAL | Status: DC | PRN
  Administered 2023-06-17 – 2023-06-19 (×2): 500 mg via ORAL
  Filled 2023-06-14 (×3): qty 1

## 2023-06-14 MED ORDER — SENNOSIDES-DOCUSATE SODIUM 8.6-50 MG PO TABS
1.0000 | ORAL_TABLET | Freq: Every evening | ORAL | Status: DC | PRN
  Administered 2023-06-17 – 2023-06-19 (×2): 1 via ORAL
  Filled 2023-06-14 (×2): qty 1

## 2023-06-14 MED ORDER — PROCHLORPERAZINE EDISYLATE 10 MG/2ML IJ SOLN: 5.0000 mg | Freq: Four times a day (QID) | INTRAMUSCULAR | Status: DC | PRN

## 2023-06-14 NOTE — ED Notes (Signed)
Patient placed on 2L [place] at this time. While patient is resting oxygen appearred to drop in the upper 80s.

## 2023-06-14 NOTE — ED Notes (Signed)
EMTALA reviewed by charge RN at this time.  All documentation complete and pt is ready for tx via [facility].

## 2023-06-14 NOTE — H&P (View-Only) (Signed)
Reason for Consult:Right humerus fx Referring Physician: Candelaria Stagers Time called: 0730 Time at bedside: 0852   Regina Snyder is an 47 y.o. female.  HPI: [name] was taking her daughter's dog out for a walk when it bolted and dragged her off the porch. She fell onto the steps and walkway. She had immediate right arm pain. She was brought to the ED at [facility] and x-rays showed a proximal humerus fx. Orthopedic surgery was consulted and recommended transfer to higher level of care due to the complexity of the fx and she was transferred to [facility]. She is RHD, lives at home alone, and works as a Geophysicist/field seismologist.  Past Medical History:  Diagnosis Date   Allergy     Past Surgical History:  Procedure Laterality Date   eyelid surgery      Family History  Problem Relation Age of Onset   Diabetes Mellitus II Brother     Social History:  reports that she has never smoked. She has never used smokeless tobacco. She reports that she does not drink alcohol and does not use drugs.  Allergies:  Allergies  Allergen Reactions   Azithromycin Other (See Comments)   Codeine Nausea Only and Nausea And Vomiting   Doxycycline Rash    Medications: I have reviewed the patient's current medications.  Results for orders placed or performed during the hospital encounter of 06/13/23 (from the past 48 hour(s))  CBC with Differential     Status: Abnormal   Collection Time: 06/14/23  1:17 AM  Result Value Ref Range   WBC 11.1 (H) 4.0 - 10.5 K/uL   RBC 4.20 3.87 - 5.11 MIL/uL   Hemoglobin 10.9 (L) 12.0 - 15.0 g/dL   HCT 16.1 09.6 - 04.5 %   MCV 86.2 80.0 - 100.0 fL   MCH 26.0 26.0 - 34.0 pg   MCHC 30.1 30.0 - 36.0 g/dL   RDW 40.9 81.1 - 91.4 %   Platelets 208 150 - 400 K/uL   nRBC 0.0 0.0 - 0.2 %   Neutrophils Relative % 87 %   Neutro Abs 9.6 (H) 1.7 - 7.7 K/uL   Lymphocytes Relative 7 %   Lymphs Abs 0.8 0.7 - 4.0 K/uL   Monocytes Relative 6 %   Monocytes Absolute 0.6 0.1 - 1.0 K/uL   Eosinophils  Relative 0 %   Eosinophils Absolute 0.0 0.0 - 0.5 K/uL   Basophils Relative 0 %   Basophils Absolute 0.0 0.0 - 0.1 K/uL   Immature Granulocytes 0 %   Abs Immature Granulocytes 0.03 0.00 - 0.07 K/uL    Comment: Performed at [facility], [address] [place], [place] [address]  Basic metabolic panel     Status: Abnormal   Collection Time: 06/14/23  1:17 AM  Result Value Ref Range   Sodium 136 135 - 145 mmol/L   Potassium 3.7 3.5 - 5.1 mmol/L   Chloride 103 98 - 111 mmol/L   CO2 26 22 - 32 mmol/L   Glucose, Bld 136 (H) 70 - 99 mg/dL    Comment: Glucose reference range applies only to samples taken after fasting for at least 8 hours.   BUN 11 6 - 20 mg/dL   Creatinine, Ser 6.21 0.44 - 1.00 mg/dL   Calcium 8.3 (L) 8.9 - 10.3 mg/dL   GFR, Estimated >30 >86 mL/min    Comment: (NOTE) Calculated using the CKD-EPI Creatinine Equation (2021)    Anion gap 7 5 - 15  Comment: Performed at [facility], [address] [place] [place], [place] [address]    CT HUMERUS RIGHT WO CONTRAST  Result Date: 06/13/2023 CLINICAL DATA:  Fall, right shoulder injury EXAM: CT OF THE RIGHT HUMERUS WITHOUT CONTRAST TECHNIQUE: Multidetector CT imaging was performed according to the standard protocol. Multiplanar CT image reconstructions were also generated. RADIATION DOSE REDUCTION: This exam was performed according to the departmental dose-optimization program which includes automated exposure control, adjustment of the mA and/or kV according to patient size and/or use of iterative reconstruction technique. COMPARISON:  None Available. FINDINGS: Bones/Joint/Cartilage There is a markedly comminuted fracture of the proximal right humerus. Fractures within the right humeral head involving the greater tuberosity, lesser tuberosity, anatomical neck and surgical neck. There is impaction of the articular surface of the humeral head with resultant articular incongruity involving the regions adjacent to the  greater tuberosity and lesser tuberosity. There is inferior subluxation of the articular surface of the humeral head in relation of the glenoid fossa without frank dislocation. There is, additionally, an oblique fracture of the proximal right femoral diaphysis distally with 1 shaft with medial displacement and 2-3 cm override of the humeral shaft. Visualized clavicle is intact. The scapula is intact. Mild acromioclavicular degenerative arthritis. Limited evaluation of the right thoracic cage is unremarkable. Ligaments Suboptimally assessed by CT. Muscles and Tendons Soft tissue evaluation is limited by beam attenuation and underpenetration. Grossly unremarkable. Soft tissues Unremarkable IMPRESSION: 1. Markedly comminuted fracture of the proximal right humerus as described above. 2. Oblique fracture of the proximal right humeral diaphysis with 1 shaft with medial displacement and 2-3 cm override of the humeral shaft. Electronically Signed   By: Helyn Numbers M.D.   On: 06/13/2023 22:03   DG Shoulder Right  Result Date: 06/13/2023 CLINICAL DATA:  Trauma, fall EXAM: RIGHT SHOULDER - 2+ VIEW COMPARISON:  None Available. FINDINGS: Limited single AP view is submitted for review. The severely comminuted fracture in the head, neck and proximal shaft of right humerus. There is 2.3 cm offset in alignment of distal fracture fragments. Possibility of dislocation could not be evaluated. If clinically warranted, follow-up CT may be considered. IMPRESSION: Severely comminuted displaced fracture is seen in the head, neck and proximal shaft of right humerus. Possibility of dislocation could not be evaluated. If clinically warranted, follow-up CT of right shoulder may be considered. Electronically Signed   By: Ernie Avena M.D.   On: 06/13/2023 21:08   DG Knee Complete 4 Views Left  Result Date: 06/13/2023 CLINICAL DATA:  Trauma, fall, pain EXAM: LEFT KNEE - COMPLETE 4+ VIEW COMPARISON:  None Available. FINDINGS: No  recent fracture or dislocation is seen in left knee. Small bony spurs are noted in the medial compartment. There is narrowing of joint space in the medial compartment. IMPRESSION: No recent fracture or dislocation is seen in left knee. Degenerative changes are noted in the medial compartment. Electronically Signed   By: Ernie Avena M.D.   On: 06/13/2023 21:06   DG Hand Complete Left  Result Date: 06/13/2023 CLINICAL DATA:  Trauma, fall, pain EXAM: LEFT HAND - COMPLETE 3+ VIEW COMPARISON:  None Available. FINDINGS: Monitoring devices is partly obscuring the distal phalanx of index finger. No displaced fracture or dislocation is seen. No opaque foreign bodies are seen. IMPRESSION: No fracture or dislocation is seen in left hand. Electronically Signed   By: Ernie Avena M.D.   On: 06/13/2023 21:05   DG Elbow Complete Right  Result Date: 06/13/2023 CLINICAL DATA:  Arm  pain after fall EXAM: RIGHT ELBOW - COMPLETE 3+ VIEW COMPARISON:  None Available. FINDINGS: Subtle lucency through the coronoid process of the ulna. Otherwise no acute fracture. No elbow joint effusion. Soft tissues are unremarkable. IMPRESSION: Subtle lucency through the coronoid process of the ulna is favored chronic or projectional with acute fracture considered less likely given lack of elbow joint effusion. If this correlates with site of pain, CT is recommended. Electronically Signed   By: Minerva Fester M.D.   On: 06/13/2023 21:04   CT Head Wo Contrast  Result Date: 06/13/2023 CLINICAL DATA:  Fall EXAM: CT HEAD WITHOUT CONTRAST TECHNIQUE: Contiguous axial images were obtained from the base of the skull through the vertex without intravenous contrast. RADIATION DOSE REDUCTION: This exam was performed according to the departmental dose-optimization program which includes automated exposure control, adjustment of the mA and/or kV according to patient size and/or use of iterative reconstruction technique. COMPARISON:  None  Available. FINDINGS: Brain: There is no mass, hemorrhage or extra-axial collection. The size and configuration of the ventricles and extra-axial CSF spaces are normal. The brain parenchyma is normal, without acute or chronic infarction. Small focus of pineal calcification. Mega cisterna magna. Vascular: No abnormal hyperdensity of the major intracranial arteries or dural venous sinuses. No intracranial atherosclerosis. Skull: Left frontal scalp hematoma without skull fracture. Sinuses/Orbits: No fluid levels or advanced mucosal thickening of the visualized paranasal sinuses. No mastoid or middle ear effusion. The orbits are normal. IMPRESSION: 1. No acute intracranial abnormality. 2. Left frontal scalp hematoma without skull fracture. Electronically Signed   By: Deatra Robinson M.D.   On: 06/13/2023 20:33   CT Cervical Spine Wo Contrast  Result Date: 06/13/2023 CLINICAL DATA:  Larey Seat down stairs EXAM: CT CERVICAL SPINE WITHOUT CONTRAST TECHNIQUE: Multidetector CT imaging of the cervical spine was performed without intravenous contrast. Multiplanar CT image reconstructions were also generated. RADIATION DOSE REDUCTION: This exam was performed according to the departmental dose-optimization program which includes automated exposure control, adjustment of the mA and/or kV according to patient size and/or use of iterative reconstruction technique. COMPARISON:  None Available. FINDINGS: Alignment: No subluxation.  Facet alignment within normal limits. Skull base and vertebrae: No acute fracture. No primary bone lesion or focal pathologic process. Soft tissues and spinal canal: No prevertebral fluid or swelling. No visible canal hematoma. Disc levels: Multilevel degenerative change with mild to moderate disc space narrowing C5-C6 and C6-C7. Upper chest: Negative. Other: None IMPRESSION: Degenerative changes. No CT evidence for acute osseous abnormality. Electronically Signed   By: Jasmine Pang M.D.   On: 06/13/2023 20:31     Review of Systems  HENT:  Negative for ear discharge, ear pain, hearing loss and tinnitus.   Eyes:  Negative for photophobia and pain.  Respiratory:  Negative for cough and shortness of breath.   Cardiovascular:  Negative for chest pain.  Gastrointestinal:  Negative for abdominal pain, nausea and vomiting.  Genitourinary:  Negative for dysuria, flank pain, frequency and urgency.  Musculoskeletal:  Positive for arthralgias (Right arm). Negative for back pain, myalgias and neck pain.  Neurological:  Negative for dizziness and headaches.  Hematological:  Does not bruise/bleed easily.  Psychiatric/Behavioral:  The patient is not nervous/anxious.    Blood pressure 118/72, pulse 81, temperature 97.6 F (36.4 C), temperature source Oral, SpO2 95%. Physical Exam Constitutional:      General: She is not in acute distress.    Appearance: She is well-developed. She is not diaphoretic.  HENT:  Head: Normocephalic.  Eyes:     General: No scleral icterus.       Right eye: No discharge.        Left eye: No discharge.     Conjunctiva/sclera: Conjunctivae normal.  Cardiovascular:     Rate and Rhythm: Normal rate and regular rhythm.  Pulmonary:     Effort: Pulmonary effort is normal. No respiratory distress.  Musculoskeletal:     Cervical back: Normal range of motion.     Comments: Right shoulder, elbow, wrist, digits- no skin wounds, severe TTP upper arm, sling in place, no instability, no blocks to motion  Sens  Ax/R/M/U intact  Mot   Ax/ R/ PIN/ M/ AIN/ U intact  Rad 2+  Skin:    General: Skin is warm and dry.  Neurological:     Mental Status: She is alert.  Psychiatric:        Mood and Affect: Mood normal.        Behavior: Behavior normal.     Assessment/Plan: Right humerus fx -- Plan ORIF today by Dr. Ave Filter. Please keep NPO.    Freeman Caldron, PA-C Orthopedic Surgery [phone] 06/14/2023, 9:11 AM

## 2023-06-14 NOTE — Progress Notes (Signed)
PROGRESS NOTE  KESI PERROW ZOX:[id number] DOB: 1976-07-20   PCP: Sherlyn Hay, DO  Patient is from: Home.  DOA: 06/14/2023 LOS: 0  Chief complaints No chief complaint on file.    Brief Narrative / Interim history: 47 year old F with PMH of morbid obesity and obesity hypoventilation presented to [facility] ED with right arm pain after she had accidental fall from her porch about 1-2 steps high, and found to have severely comminuted displaced fracture in the head, neck and proximal shaft of right humerus with possible dislocation as noted on x-ray and CT.  Orthopedic surgery [facility] recommended transfer to [facility] for trauma surgery evaluation and management.  CT head without acute intracranial abnormality but left frontal scalp hematoma without skull fracture.  CT cervical spine without acute finding.  Left knee x-ray and left hand x-ray without acute finding.  Right shoulder x-ray and CT right humerus showed comminuted fracture of right proximal humerus and oblique fracture of proximal right humerus diaphysis.     Subjective: Seen and examined earlier this morning.  No major events overnight of this morning.  Reports nausea.  Also reports pain in her right arm.  She denies numbness or tingling.  Also reports some bruising over left knee that is hurting.  Objective: Vitals:   06/14/23 0727  BP: 118/72  Pulse: 81  Temp: 97.6 F (36.4 C)  TempSrc: Oral  SpO2: 95%    Examination:  GENERAL: No apparent distress.  Nontoxic. HEENT: MMM.  Vision and hearing grossly intact.  NECK: Supple.  No apparent JVD.  RESP:  No IWOB.  Fair aeration bilaterally. CVS:  RRR. Heart sounds normal.  ABD/GI/GU: BS+. Abd soft, NTND.  MSK/EXT:  Moves extremities.  Right arm in sling.  Neurovascular intact.  Bruising over left knee. SKIN: no apparent skin lesion or wound NEURO: Awake, alert and oriented appropriately.  No apparent focal neuro deficit. PSYCH: Calm. Normal affect.   Procedures:   None  Microbiology summarized: None  Assessment and plan: Principal Problem:   Closed fracture of right proximal humerus Active Problems:   Right humeral fracture   Obesity, Class III, BMI 40-49.9 (morbid obesity) (HCC)   Obesity hypoventilation syndrome (HCC)   Right supracondylar humerus fracture, closed, initial encounter   Hematoma of frontal scalp   Chronic acquired lymphedema   Traumatic right humerus comminuted displaced fracture due to mechanical fall -Plan for ORIF by orthopedic surgery -Pain control    Left frontal scalp hematoma: CT head without acute intracranial abnormality of skull fracture. -Supportive care   Obesity hypoventilation syndrome: Not on CPAP or BiPAP. -Would be cautious with opiate due to risk for respiratory depression and CO2 retention.     Morbid obesity: BMI 61.27. -Lifestyle change to lose weight -She may benefit from GLP-1 agonist or bariatric surgery outpatient.  Nausea: Likely from pain meds -Antiemetics as needed         DVT prophylaxis:  SCDs Start: 06/14/23 0536 SCDs Start: 06/14/23 0411  Code Status: Full code Family Communication: None at bedside Level of care: Med-Surg Status is: Inpatient Remains inpatient appropriate because: Right humeral fracture   Final disposition: TBD Consultants:  Orthopedic surgery  35 minutes with more than 50% spent in reviewing records, counseling patient/family and coordinating care.   Sch Meds:  Scheduled Meds:  oxyCODONE  10-15 mg Oral Q6H   sodium chloride flush  3 mL Intravenous Q12H   sodium chloride flush  3 mL Intravenous Q12H   Continuous Infusions:  sodium chloride  100 mL/hr at 06/14/23 0939   sodium chloride      ceFAZolin (ANCEF) IV     lactated ringers 75 mL/hr at 06/14/23 0518   methocarbamol (ROBAXIN) IV     tranexamic acid     PRN Meds:.sodium chloride, acetaminophen **OR** acetaminophen, albuterol, hydrALAZINE, HYDROmorphone (DILAUDID) injection,  methocarbamol **OR** methocarbamol (ROBAXIN) IV, ondansetron **OR** ondansetron (ZOFRAN) IV, senna-docusate, sodium chloride flush  Antimicrobials: Anti-infectives (From admission, onward)    Start     Dose/Rate Route Frequency Ordered Stop   06/14/23 0900  ceFAZolin (ANCEF) IVPB 3g/100 mL premix        3 g 200 mL/hr over 30 Minutes Intravenous On call to O.R. 06/14/23 0802 06/15/23 0559        I have personally reviewed the following labs and images: CBC: Recent Labs  Lab 06/14/23 0117 06/14/23 1119  WBC 11.1* 7.5  NEUTROABS 9.6*  --   HGB 10.9* 10.5*  HCT 36.2 33.6*  MCV 86.2 85.1  PLT 208 196   BMP &GFR Recent Labs  Lab 06/14/23 0117 06/14/23 1119  NA 136 134*  K 3.7 3.6  CL 103 102  CO2 26 26  GLUCOSE 136* 127*  BUN 11 7  CREATININE 0.68 0.60  CALCIUM 8.3* 8.2*   Estimated Creatinine Clearance: 159.3 mL/min (by C-G formula based on SCr of 0.6 mg/dL). Liver & Pancreas: Recent Labs  Lab 06/14/23 1119  AST 15  ALT 23  ALKPHOS 55  BILITOT 1.1  PROT 6.1*  ALBUMIN 2.9*   No results for input(s): "LIPASE", "AMYLASE" in the last 168 hours. No results for input(s): "AMMONIA" in the last 168 hours. Diabetic: No results for input(s): "HGBA1C" in the last 72 hours. No results for input(s): "GLUCAP" in the last 168 hours. Cardiac Enzymes: No results for input(s): "CKTOTAL", "CKMB", "CKMBINDEX", "TROPONINI" in the last 168 hours. No results for input(s): "PROBNP" in the last 8760 hours. Coagulation Profile: Recent Labs  Lab 06/14/23 1119  INR 1.1   Thyroid Function Tests: No results for input(s): "TSH", "T4TOTAL", "FREET4", "T3FREE", "THYROIDAB" in the last 72 hours. Lipid Profile: No results for input(s): "CHOL", "HDL", "LDLCALC", "TRIG", "CHOLHDL", "LDLDIRECT" in the last 72 hours. Anemia Panel: No results for input(s): "VITAMINB12", "FOLATE", "FERRITIN", "TIBC", "IRON", "RETICCTPCT" in the last 72 hours. Urine analysis:    Component Value Date/Time    BILIRUBINUR negative 04/29/2020 1414   PROTEINUR Negative 04/29/2020 1414   UROBILINOGEN 0.2 04/29/2020 1414   NITRITE negative 04/29/2020 1414   LEUKOCYTESUR Negative 04/29/2020 1414   Sepsis Labs: Invalid input(s): "PROCALCITONIN", "LACTICIDVEN"  Microbiology: No results found for this or any previous visit (from the past 240 hour(s)).  Radiology Studies: CT HUMERUS RIGHT WO CONTRAST  Result Date: 06/13/2023 CLINICAL DATA:  Fall, right shoulder injury EXAM: CT OF THE RIGHT HUMERUS WITHOUT CONTRAST TECHNIQUE: Multidetector CT imaging was performed according to the standard protocol. Multiplanar CT image reconstructions were also generated. RADIATION DOSE REDUCTION: This exam was performed according to the departmental dose-optimization program which includes automated exposure control, adjustment of the mA and/or kV according to patient size and/or use of iterative reconstruction technique. COMPARISON:  None Available. FINDINGS: Bones/Joint/Cartilage There is a markedly comminuted fracture of the proximal right humerus. Fractures within the right humeral head involving the greater tuberosity, lesser tuberosity, anatomical neck and surgical neck. There is impaction of the articular surface of the humeral head with resultant articular incongruity involving the regions adjacent to the greater tuberosity and lesser tuberosity. There is inferior subluxation of  the articular surface of the humeral head in relation of the glenoid fossa without frank dislocation. There is, additionally, an oblique fracture of the proximal right femoral diaphysis distally with 1 shaft with medial displacement and 2-3 cm override of the humeral shaft. Visualized clavicle is intact. The scapula is intact. Mild acromioclavicular degenerative arthritis. Limited evaluation of the right thoracic cage is unremarkable. Ligaments Suboptimally assessed by CT. Muscles and Tendons Soft tissue evaluation is limited by beam attenuation  and underpenetration. Grossly unremarkable. Soft tissues Unremarkable IMPRESSION: 1. Markedly comminuted fracture of the proximal right humerus as described above. 2. Oblique fracture of the proximal right humeral diaphysis with 1 shaft with medial displacement and 2-3 cm override of the humeral shaft. Electronically Signed   By: Helyn Numbers M.D.   On: 06/13/2023 22:03   DG Shoulder Right  Result Date: 06/13/2023 CLINICAL DATA:  Trauma, fall EXAM: RIGHT SHOULDER - 2+ VIEW COMPARISON:  None Available. FINDINGS: Limited single AP view is submitted for review. The severely comminuted fracture in the head, neck and proximal shaft of right humerus. There is 2.3 cm offset in alignment of distal fracture fragments. Possibility of dislocation could not be evaluated. If clinically warranted, follow-up CT may be considered. IMPRESSION: Severely comminuted displaced fracture is seen in the head, neck and proximal shaft of right humerus. Possibility of dislocation could not be evaluated. If clinically warranted, follow-up CT of right shoulder may be considered. Electronically Signed   By: Ernie Avena M.D.   On: 06/13/2023 21:08   DG Knee Complete 4 Views Left  Result Date: 06/13/2023 CLINICAL DATA:  Trauma, fall, pain EXAM: LEFT KNEE - COMPLETE 4+ VIEW COMPARISON:  None Available. FINDINGS: No recent fracture or dislocation is seen in left knee. Small bony spurs are noted in the medial compartment. There is narrowing of joint space in the medial compartment. IMPRESSION: No recent fracture or dislocation is seen in left knee. Degenerative changes are noted in the medial compartment. Electronically Signed   By: Ernie Avena M.D.   On: 06/13/2023 21:06   DG Hand Complete Left  Result Date: 06/13/2023 CLINICAL DATA:  Trauma, fall, pain EXAM: LEFT HAND - COMPLETE 3+ VIEW COMPARISON:  None Available. FINDINGS: Monitoring devices is partly obscuring the distal phalanx of index finger. No displaced  fracture or dislocation is seen. No opaque foreign bodies are seen. IMPRESSION: No fracture or dislocation is seen in left hand. Electronically Signed   By: Ernie Avena M.D.   On: 06/13/2023 21:05   DG Elbow Complete Right  Result Date: 06/13/2023 CLINICAL DATA:  Arm pain after fall EXAM: RIGHT ELBOW - COMPLETE 3+ VIEW COMPARISON:  None Available. FINDINGS: Subtle lucency through the coronoid process of the ulna. Otherwise no acute fracture. No elbow joint effusion. Soft tissues are unremarkable. IMPRESSION: Subtle lucency through the coronoid process of the ulna is favored chronic or projectional with acute fracture considered less likely given lack of elbow joint effusion. If this correlates with site of pain, CT is recommended. Electronically Signed   By: Minerva Fester M.D.   On: 06/13/2023 21:04   CT Head Wo Contrast  Result Date: 06/13/2023 CLINICAL DATA:  Fall EXAM: CT HEAD WITHOUT CONTRAST TECHNIQUE: Contiguous axial images were obtained from the base of the skull through the vertex without intravenous contrast. RADIATION DOSE REDUCTION: This exam was performed according to the departmental dose-optimization program which includes automated exposure control, adjustment of the mA and/or kV according to patient size and/or use of iterative reconstruction  technique. COMPARISON:  None Available. FINDINGS: Brain: There is no mass, hemorrhage or extra-axial collection. The size and configuration of the ventricles and extra-axial CSF spaces are normal. The brain parenchyma is normal, without acute or chronic infarction. Small focus of pineal calcification. Mega cisterna magna. Vascular: No abnormal hyperdensity of the major intracranial arteries or dural venous sinuses. No intracranial atherosclerosis. Skull: Left frontal scalp hematoma without skull fracture. Sinuses/Orbits: No fluid levels or advanced mucosal thickening of the visualized paranasal sinuses. No mastoid or middle ear effusion. The  orbits are normal. IMPRESSION: 1. No acute intracranial abnormality. 2. Left frontal scalp hematoma without skull fracture. Electronically Signed   By: Deatra Robinson M.D.   On: 06/13/2023 20:33   CT Cervical Spine Wo Contrast  Result Date: 06/13/2023 CLINICAL DATA:  Larey Seat down stairs EXAM: CT CERVICAL SPINE WITHOUT CONTRAST TECHNIQUE: Multidetector CT imaging of the cervical spine was performed without intravenous contrast. Multiplanar CT image reconstructions were also generated. RADIATION DOSE REDUCTION: This exam was performed according to the departmental dose-optimization program which includes automated exposure control, adjustment of the mA and/or kV according to patient size and/or use of iterative reconstruction technique. COMPARISON:  None Available. FINDINGS: Alignment: No subluxation.  Facet alignment within normal limits. Skull base and vertebrae: No acute fracture. No primary bone lesion or focal pathologic process. Soft tissues and spinal canal: No prevertebral fluid or swelling. No visible canal hematoma. Disc levels: Multilevel degenerative change with mild to moderate disc space narrowing C5-C6 and C6-C7. Upper chest: Negative. Other: None IMPRESSION: Degenerative changes. No CT evidence for acute osseous abnormality. Electronically Signed   By: Jasmine Pang M.D.   On: 06/13/2023 20:31      Shila Kruczek T. Cassidey Barrales [facility]  If 7PM-7AM, please contact night-coverage [email or web address] 06/14/2023, 1:58 PM

## 2023-06-14 NOTE — Progress Notes (Signed)
   Patient Name: Regina Snyder, Regina Snyder DOB: [birth year] MRN: [id number] Transferring facility: [facility] Requesting provider: cuthreill, PA Reason for transfer: comminuted right humerus fracture 47 yo WF pulled off her porch and sustained comminuted right proximal humerus fracture. EDP discussed with [facility] ortho who declined admission. wanted pt to go to [facility] for ortho/trauma to see. EDP discussed with Dr. Ave Filter Going to: [facility] Admission Status: obs Bed Type: med/surg To Do:  [facility] will assume care on arrival to accepting facility. Until arrival, medical decision making responsibilities remain with the EDP.  However, [facility] available 24/7 for questions and assistance.   Nursing staff please page [facility] Admits and Consults [phone]) as soon as the patient arrives to the hospital.  Carollee Herter, DO [facility]

## 2023-06-14 NOTE — ED Notes (Addendum)
[facility] [phone] [phone] Accepted by Jones Broom, md

## 2023-06-14 NOTE — ED Notes (Signed)
Report given to Calvert Cantor, RN on [facility] at [facility]. Report given to [facility]. Transfer packet, EMTALA, Med Necessity, and consent to transfer given to [facility]. Patient transferred to [facility] via [facility].

## 2023-06-14 NOTE — Consult Note (Signed)
Reason for Consult:Right humerus fx Referring Physician: Candelaria Stagers Time called: 0730 Time at bedside: 0852   Regina Snyder is an 47 y.o. female.  HPI: Regina Snyder was taking her daughter's dog out for a walk when it bolted and dragged her off the porch. She fell onto the steps and walkway. She had immediate right arm pain. She was brought to the ED at [facility] and x-rays showed a proximal humerus fx. Orthopedic surgery was consulted and recommended transfer to higher level of care due to the complexity of the fx and she was transferred to [facility]. She is RHD, lives at home alone, and works as a Geophysicist/field seismologist.  Past Medical History:  Diagnosis Date   Allergy     Past Surgical History:  Procedure Laterality Date   eyelid surgery      Family History  Problem Relation Age of Onset   Diabetes Mellitus II Brother     Social History:  reports that she has never smoked. She has never used smokeless tobacco. She reports that she does not drink alcohol and does not use drugs.  Allergies:  Allergies  Allergen Reactions   Azithromycin Other (See Comments)   Codeine Nausea Only and Nausea And Vomiting   Doxycycline Rash    Medications: I have reviewed the patient's current medications.  Results for orders placed or performed during the hospital encounter of 06/13/23 (from the past 48 hour(s))  CBC with Differential     Status: Abnormal   Collection Time: 06/14/23  1:17 AM  Result Value Ref Range   WBC 11.1 (H) 4.0 - 10.5 K/uL   RBC 4.20 3.87 - 5.11 MIL/uL   Hemoglobin 10.9 (L) 12.0 - 15.0 g/dL   HCT 16.1 09.6 - 04.5 %   MCV 86.2 80.0 - 100.0 fL   MCH 26.0 26.0 - 34.0 pg   MCHC 30.1 30.0 - 36.0 g/dL   RDW 40.9 81.1 - 91.4 %   Platelets 208 150 - 400 K/uL   nRBC 0.0 0.0 - 0.2 %   Neutrophils Relative % 87 %   Neutro Abs 9.6 (H) 1.7 - 7.7 K/uL   Lymphocytes Relative 7 %   Lymphs Abs 0.8 0.7 - 4.0 K/uL   Monocytes Relative 6 %   Monocytes Absolute 0.6 0.1 - 1.0 K/uL   Eosinophils  Relative 0 %   Eosinophils Absolute 0.0 0.0 - 0.5 K/uL   Basophils Relative 0 %   Basophils Absolute 0.0 0.0 - 0.1 K/uL   Immature Granulocytes 0 %   Abs Immature Granulocytes 0.03 0.00 - 0.07 K/uL    Comment: Performed at [facility], [address] [place], [place] [address]  Basic metabolic panel     Status: Abnormal   Collection Time: 06/14/23  1:17 AM  Result Value Ref Range   Sodium 136 135 - 145 mmol/L   Potassium 3.7 3.5 - 5.1 mmol/L   Chloride 103 98 - 111 mmol/L   CO2 26 22 - 32 mmol/L   Glucose, Bld 136 (H) 70 - 99 mg/dL    Comment: Glucose reference range applies only to samples taken after fasting for at least 8 hours.   BUN 11 6 - 20 mg/dL   Creatinine, Ser 6.21 0.44 - 1.00 mg/dL   Calcium 8.3 (L) 8.9 - 10.3 mg/dL   GFR, Estimated >30 >86 mL/min    Comment: (NOTE) Calculated using the CKD-EPI Creatinine Equation (2021)    Anion gap 7 5 - 15  Comment: Performed at [facility], [address] [place] [place], [place] [address]    CT HUMERUS RIGHT WO CONTRAST  Result Date: 06/13/2023 CLINICAL DATA:  Fall, right shoulder injury EXAM: CT OF THE RIGHT HUMERUS WITHOUT CONTRAST TECHNIQUE: Multidetector CT imaging was performed according to the standard protocol. Multiplanar CT image reconstructions were also generated. RADIATION DOSE REDUCTION: This exam was performed according to the departmental dose-optimization program which includes automated exposure control, adjustment of the mA and/or kV according to patient size and/or use of iterative reconstruction technique. COMPARISON:  None Available. FINDINGS: Bones/Joint/Cartilage There is a markedly comminuted fracture of the proximal right humerus. Fractures within the right humeral head involving the greater tuberosity, lesser tuberosity, anatomical neck and surgical neck. There is impaction of the articular surface of the humeral head with resultant articular incongruity involving the regions adjacent to the  greater tuberosity and lesser tuberosity. There is inferior subluxation of the articular surface of the humeral head in relation of the glenoid fossa without frank dislocation. There is, additionally, an oblique fracture of the proximal right femoral diaphysis distally with 1 shaft with medial displacement and 2-3 cm override of the humeral shaft. Visualized clavicle is intact. The scapula is intact. Mild acromioclavicular degenerative arthritis. Limited evaluation of the right thoracic cage is unremarkable. Ligaments Suboptimally assessed by CT. Muscles and Tendons Soft tissue evaluation is limited by beam attenuation and underpenetration. Grossly unremarkable. Soft tissues Unremarkable IMPRESSION: 1. Markedly comminuted fracture of the proximal right humerus as described above. 2. Oblique fracture of the proximal right humeral diaphysis with 1 shaft with medial displacement and 2-3 cm override of the humeral shaft. Electronically Signed   By: Helyn Numbers M.D.   On: 06/13/2023 22:03   DG Shoulder Right  Result Date: 06/13/2023 CLINICAL DATA:  Trauma, fall EXAM: RIGHT SHOULDER - 2+ VIEW COMPARISON:  None Available. FINDINGS: Limited single AP view is submitted for review. The severely comminuted fracture in the head, neck and proximal shaft of right humerus. There is 2.3 cm offset in alignment of distal fracture fragments. Possibility of dislocation could not be evaluated. If clinically warranted, follow-up CT may be considered. IMPRESSION: Severely comminuted displaced fracture is seen in the head, neck and proximal shaft of right humerus. Possibility of dislocation could not be evaluated. If clinically warranted, follow-up CT of right shoulder may be considered. Electronically Signed   By: Ernie Avena M.D.   On: 06/13/2023 21:08   DG Knee Complete 4 Views Left  Result Date: 06/13/2023 CLINICAL DATA:  Trauma, fall, pain EXAM: LEFT KNEE - COMPLETE 4+ VIEW COMPARISON:  None Available. FINDINGS: No  recent fracture or dislocation is seen in left knee. Small bony spurs are noted in the medial compartment. There is narrowing of joint space in the medial compartment. IMPRESSION: No recent fracture or dislocation is seen in left knee. Degenerative changes are noted in the medial compartment. Electronically Signed   By: Ernie Avena M.D.   On: 06/13/2023 21:06   DG Hand Complete Left  Result Date: 06/13/2023 CLINICAL DATA:  Trauma, fall, pain EXAM: LEFT HAND - COMPLETE 3+ VIEW COMPARISON:  None Available. FINDINGS: Monitoring devices is partly obscuring the distal phalanx of index finger. No displaced fracture or dislocation is seen. No opaque foreign bodies are seen. IMPRESSION: No fracture or dislocation is seen in left hand. Electronically Signed   By: Ernie Avena M.D.   On: 06/13/2023 21:05   DG Elbow Complete Right  Result Date: 06/13/2023 CLINICAL DATA:  Arm  pain after fall EXAM: RIGHT ELBOW - COMPLETE 3+ VIEW COMPARISON:  None Available. FINDINGS: Subtle lucency through the coronoid process of the ulna. Otherwise no acute fracture. No elbow joint effusion. Soft tissues are unremarkable. IMPRESSION: Subtle lucency through the coronoid process of the ulna is favored chronic or projectional with acute fracture considered less likely given lack of elbow joint effusion. If this correlates with site of pain, CT is recommended. Electronically Signed   By: Minerva Fester M.D.   On: 06/13/2023 21:04   CT Head Wo Contrast  Result Date: 06/13/2023 CLINICAL DATA:  Fall EXAM: CT HEAD WITHOUT CONTRAST TECHNIQUE: Contiguous axial images were obtained from the base of the skull through the vertex without intravenous contrast. RADIATION DOSE REDUCTION: This exam was performed according to the departmental dose-optimization program which includes automated exposure control, adjustment of the mA and/or kV according to patient size and/or use of iterative reconstruction technique. COMPARISON:  None  Available. FINDINGS: Brain: There is no mass, hemorrhage or extra-axial collection. The size and configuration of the ventricles and extra-axial CSF spaces are normal. The brain parenchyma is normal, without acute or chronic infarction. Small focus of pineal calcification. Mega cisterna magna. Vascular: No abnormal hyperdensity of the major intracranial arteries or dural venous sinuses. No intracranial atherosclerosis. Skull: Left frontal scalp hematoma without skull fracture. Sinuses/Orbits: No fluid levels or advanced mucosal thickening of the visualized paranasal sinuses. No mastoid or middle ear effusion. The orbits are normal. IMPRESSION: 1. No acute intracranial abnormality. 2. Left frontal scalp hematoma without skull fracture. Electronically Signed   By: Deatra Robinson M.D.   On: 06/13/2023 20:33   CT Cervical Spine Wo Contrast  Result Date: 06/13/2023 CLINICAL DATA:  Larey Seat down stairs EXAM: CT CERVICAL SPINE WITHOUT CONTRAST TECHNIQUE: Multidetector CT imaging of the cervical spine was performed without intravenous contrast. Multiplanar CT image reconstructions were also generated. RADIATION DOSE REDUCTION: This exam was performed according to the departmental dose-optimization program which includes automated exposure control, adjustment of the mA and/or kV according to patient size and/or use of iterative reconstruction technique. COMPARISON:  None Available. FINDINGS: Alignment: No subluxation.  Facet alignment within normal limits. Skull base and vertebrae: No acute fracture. No primary bone lesion or focal pathologic process. Soft tissues and spinal canal: No prevertebral fluid or swelling. No visible canal hematoma. Disc levels: Multilevel degenerative change with mild to moderate disc space narrowing C5-C6 and C6-C7. Upper chest: Negative. Other: None IMPRESSION: Degenerative changes. No CT evidence for acute osseous abnormality. Electronically Signed   By: Jasmine Pang M.D.   On: 06/13/2023 20:31     Review of Systems  HENT:  Negative for ear discharge, ear pain, hearing loss and tinnitus.   Eyes:  Negative for photophobia and pain.  Respiratory:  Negative for cough and shortness of breath.   Cardiovascular:  Negative for chest pain.  Gastrointestinal:  Negative for abdominal pain, nausea and vomiting.  Genitourinary:  Negative for dysuria, flank pain, frequency and urgency.  Musculoskeletal:  Positive for arthralgias (Right arm). Negative for back pain, myalgias and neck pain.  Neurological:  Negative for dizziness and headaches.  Hematological:  Does not bruise/bleed easily.  Psychiatric/Behavioral:  The patient is not nervous/anxious.    Blood pressure 118/72, pulse 81, temperature 97.6 F (36.4 C), temperature source Oral, SpO2 95%. Physical Exam Constitutional:      General: She is not in acute distress.    Appearance: She is well-developed. She is not diaphoretic.  HENT:  Head: Normocephalic.  Eyes:     General: No scleral icterus.       Right eye: No discharge.        Left eye: No discharge.     Conjunctiva/sclera: Conjunctivae normal.  Cardiovascular:     Rate and Rhythm: Normal rate and regular rhythm.  Pulmonary:     Effort: Pulmonary effort is normal. No respiratory distress.  Musculoskeletal:     Cervical back: Normal range of motion.     Comments: Right shoulder, elbow, wrist, digits- no skin wounds, severe TTP upper arm, sling in place, no instability, no blocks to motion  Sens  Ax/R/M/U intact  Mot   Ax/ R/ PIN/ M/ AIN/ U intact  Rad 2+  Skin:    General: Skin is warm and dry.  Neurological:     Mental Status: She is alert.  Psychiatric:        Mood and Affect: Mood normal.        Behavior: Behavior normal.     Assessment/Plan: Right humerus fx -- Plan ORIF today by Dr. Ave Filter. Please keep NPO.    Freeman Caldron, PA-C Orthopedic Surgery [phone] 06/14/2023, 9:11 AM

## 2023-06-14 NOTE — H&P (Addendum)
History and Physical    Regina Snyder:[id number] DOB: 01/01/1976 DOA: 06/14/2023  PCP: Sherlyn Hay, DO   Patient coming from: Home   Chief Complaint: Fall at home  HPI:  This is a 47 year old female medical history significant for morbid obesity and obesity hypoventilation syndrome presented to emergency department with complaining of fall at home followed by right-sided forearm and arm pain.  Patient stated that she was on the porch roughly 1-2 steps high, holding the leash for her dog when the dog took off.  Patient reported the leash pulled her off the porch and she landed on the ground facing mostly of her face and right-sided shoulder.  Since then she is complaining about bilateral arm, forearm, hand and bilateral knee pain.  Patient denies any injury of the head, neck and hip injury. Extensive imaging, CT head no acute intracranial abnormality.  Left frontal scalp hematoma without skull fracture. CT cervical spine showed degenerative change, no evidence of acute fracture. X-ray right shoulder showed severely comminuted displaced fracture in the head, neck and proximal shaft of right humerus.  Possible dislocation. X-ray right elbow Subtle lucency through the coronoid process of the ulna is favored chronic or projectional with acute fracture considered less likely given lack of elbow joint effusion. X-ray left knee no fracture or dislocation.  Degenerative changes noted of the medial compartment. X-ray left hand no fracture or dislocation. CT humerus of the right side showed moderately comminuted fracture of right proximal humerus as described on the x-ray and oblique fracture of proximal right humerus diaphysis with 1 shaft width medial displacement and 2 to 3 cm over right upper humeral shaft.  With this imaging finding ED physician reached out to on-call orthopedics surgeon and reviewed CT scan.  Given the nature of the fracture orthopedics at [facility] recommended transfer to  tertiary center for repair. ED physician reached out to orthopedic surgeon on-call at [facility] and patient has been accepted for transfer here. I have reached out to on-call orthopedic surgeon Dr. Ave Filter is aware of the patient.    ED Course:  At ED on initial presentation patient's vitals stable.  Patient received Dilaudid and Roxicodone for pain.  Review of Systems:  Review of Systems  Respiratory:  Negative for cough.   Cardiovascular:  Negative for chest pain.  Gastrointestinal:  Positive for nausea. Negative for abdominal pain, heartburn and vomiting.  Musculoskeletal:  Positive for joint pain.  Skin:        Bruises around the right humeral region and left elbow joint.  Neurological:  Negative for dizziness, sensory change, focal weakness, weakness and headaches.  Psychiatric/Behavioral:  The patient is not nervous/anxious.     Past Medical History:  Diagnosis Date   Allergy     Past Surgical History:  Procedure Laterality Date   eyelid surgery       reports that she has never smoked. She has never used smokeless tobacco. She reports that she does not drink alcohol and does not use drugs.  Allergies  Allergen Reactions   Azithromycin Other (See Comments)   Codeine Nausea Only and Nausea And Vomiting   Doxycycline Rash    Family History  Problem Relation Age of Onset   Diabetes Mellitus II Brother     Prior to Admission medications   Medication Sig Start Date End Date Taking? Authorizing Provider  albuterol (VENTOLIN HFA) 108 (90 Base) MCG/ACT inhaler Inhale 2 puffs into the lungs every 6 (six) hours as needed for wheezing  or shortness of breath. Patient not taking: Reported on 06/13/2023 11/07/21   FlinchumEula Fried, FNP     Physical Exam: There were no vitals filed for this visit.  Physical Exam Constitutional:      Appearance: She is obese.  HENT:     Mouth/Throat:     Mouth: Mucous membranes are moist.  Eyes:     Pupils: Pupils are equal,  round, and reactive to light.  Cardiovascular:     Rate and Rhythm: Normal rate and regular rhythm.     Pulses: Normal pulses.     Heart sounds: Normal heart sounds.  Pulmonary:     Effort: Pulmonary effort is normal.     Breath sounds: Normal breath sounds.  Abdominal:     General: Bowel sounds are normal.  Musculoskeletal:     Cervical back: Neck supple.     Right lower leg: Edema present.     Left lower leg: Edema present.     Comments: Right-sided upper arm pain 8 out of 10 intensity. Left-sided elbow superficial scratches. Bilateral lower extremities nonpitting edema  Skin:    General: Skin is warm.     Capillary Refill: Capillary refill takes less than 2 seconds.  Neurological:     Mental Status: She is alert and oriented to person, place, and time.     Cranial Nerves: No cranial nerve deficit.     Motor: No weakness.  Psychiatric:        Mood and Affect: Mood normal.      Labs on Admission: I have personally reviewed following labs and imaging studies  CBC: Recent Labs  Lab 06/14/23 0117  WBC 11.1*  NEUTROABS 9.6*  HGB 10.9*  HCT 36.2  MCV 86.2  PLT 208   Basic Metabolic Panel: Recent Labs  Lab 06/14/23 0117  NA 136  K 3.7  CL 103  CO2 26  GLUCOSE 136*  BUN 11  CREATININE 0.68  CALCIUM 8.3*   GFR: Estimated Creatinine Clearance: 159.3 mL/min (by C-G formula based on SCr of 0.68 mg/dL). Liver Function Tests: No results for input(s): "AST", "ALT", "ALKPHOS", "BILITOT", "PROT", "ALBUMIN" in the last 168 hours. No results for input(s): "LIPASE", "AMYLASE" in the last 168 hours. No results for input(s): "AMMONIA" in the last 168 hours. Coagulation Profile: No results for input(s): "INR", "PROTIME" in the last 168 hours. Cardiac Enzymes: No results for input(s): "CKTOTAL", "CKMB", "CKMBINDEX", "TROPONINI", "TROPONINIHS" in the last 168 hours. BNP (last 3 results) No results for input(s): "BNP" in the last 8760 hours. HbA1C: No results for input(s):  "HGBA1C" in the last 72 hours. CBG: No results for input(s): "GLUCAP" in the last 168 hours. Lipid Profile: No results for input(s): "CHOL", "HDL", "LDLCALC", "TRIG", "CHOLHDL", "LDLDIRECT" in the last 72 hours. Thyroid Function Tests: No results for input(s): "TSH", "T4TOTAL", "FREET4", "T3FREE", "THYROIDAB" in the last 72 hours. Anemia Panel: No results for input(s): "VITAMINB12", "FOLATE", "FERRITIN", "TIBC", "IRON", "RETICCTPCT" in the last 72 hours. Urine analysis:    Component Value Date/Time   BILIRUBINUR negative 04/29/2020 1414   PROTEINUR Negative 04/29/2020 1414   UROBILINOGEN 0.2 04/29/2020 1414   NITRITE negative 04/29/2020 1414   LEUKOCYTESUR Negative 04/29/2020 1414    Radiological Exams on Admission: I have personally reviewed images CT HUMERUS RIGHT WO CONTRAST  Result Date: 06/13/2023 CLINICAL DATA:  Fall, right shoulder injury EXAM: CT OF THE RIGHT HUMERUS WITHOUT CONTRAST TECHNIQUE: Multidetector CT imaging was performed according to the standard protocol. Multiplanar CT image reconstructions were  also generated. RADIATION DOSE REDUCTION: This exam was performed according to the departmental dose-optimization program which includes automated exposure control, adjustment of the mA and/or kV according to patient size and/or use of iterative reconstruction technique. COMPARISON:  None Available. FINDINGS: Bones/Joint/Cartilage There is a markedly comminuted fracture of the proximal right humerus. Fractures within the right humeral head involving the greater tuberosity, lesser tuberosity, anatomical neck and surgical neck. There is impaction of the articular surface of the humeral head with resultant articular incongruity involving the regions adjacent to the greater tuberosity and lesser tuberosity. There is inferior subluxation of the articular surface of the humeral head in relation of the glenoid fossa without frank dislocation. There is, additionally, an oblique fracture of  the proximal right femoral diaphysis distally with 1 shaft with medial displacement and 2-3 cm override of the humeral shaft. Visualized clavicle is intact. The scapula is intact. Mild acromioclavicular degenerative arthritis. Limited evaluation of the right thoracic cage is unremarkable. Ligaments Suboptimally assessed by CT. Muscles and Tendons Soft tissue evaluation is limited by beam attenuation and underpenetration. Grossly unremarkable. Soft tissues Unremarkable IMPRESSION: 1. Markedly comminuted fracture of the proximal right humerus as described above. 2. Oblique fracture of the proximal right humeral diaphysis with 1 shaft with medial displacement and 2-3 cm override of the humeral shaft. Electronically Signed   By: Helyn Numbers M.D.   On: 06/13/2023 22:03   DG Shoulder Right  Result Date: 06/13/2023 CLINICAL DATA:  Trauma, fall EXAM: RIGHT SHOULDER - 2+ VIEW COMPARISON:  None Available. FINDINGS: Limited single AP view is submitted for review. The severely comminuted fracture in the head, neck and proximal shaft of right humerus. There is 2.3 cm offset in alignment of distal fracture fragments. Possibility of dislocation could not be evaluated. If clinically warranted, follow-up CT may be considered. IMPRESSION: Severely comminuted displaced fracture is seen in the head, neck and proximal shaft of right humerus. Possibility of dislocation could not be evaluated. If clinically warranted, follow-up CT of right shoulder may be considered. Electronically Signed   By: Ernie Avena M.D.   On: 06/13/2023 21:08   DG Knee Complete 4 Views Left  Result Date: 06/13/2023 CLINICAL DATA:  Trauma, fall, pain EXAM: LEFT KNEE - COMPLETE 4+ VIEW COMPARISON:  None Available. FINDINGS: No recent fracture or dislocation is seen in left knee. Small bony spurs are noted in the medial compartment. There is narrowing of joint space in the medial compartment. IMPRESSION: No recent fracture or dislocation is seen in  left knee. Degenerative changes are noted in the medial compartment. Electronically Signed   By: Ernie Avena M.D.   On: 06/13/2023 21:06   DG Hand Complete Left  Result Date: 06/13/2023 CLINICAL DATA:  Trauma, fall, pain EXAM: LEFT HAND - COMPLETE 3+ VIEW COMPARISON:  None Available. FINDINGS: Monitoring devices is partly obscuring the distal phalanx of index finger. No displaced fracture or dislocation is seen. No opaque foreign bodies are seen. IMPRESSION: No fracture or dislocation is seen in left hand. Electronically Signed   By: Ernie Avena M.D.   On: 06/13/2023 21:05   DG Elbow Complete Right  Result Date: 06/13/2023 CLINICAL DATA:  Arm pain after fall EXAM: RIGHT ELBOW - COMPLETE 3+ VIEW COMPARISON:  None Available. FINDINGS: Subtle lucency through the coronoid process of the ulna. Otherwise no acute fracture. No elbow joint effusion. Soft tissues are unremarkable. IMPRESSION: Subtle lucency through the coronoid process of the ulna is favored chronic or projectional with acute fracture considered less  likely given lack of elbow joint effusion. If this correlates with site of pain, CT is recommended. Electronically Signed   By: Minerva Fester M.D.   On: 06/13/2023 21:04   CT Head Wo Contrast  Result Date: 06/13/2023 CLINICAL DATA:  Fall EXAM: CT HEAD WITHOUT CONTRAST TECHNIQUE: Contiguous axial images were obtained from the base of the skull through the vertex without intravenous contrast. RADIATION DOSE REDUCTION: This exam was performed according to the departmental dose-optimization program which includes automated exposure control, adjustment of the mA and/or kV according to patient size and/or use of iterative reconstruction technique. COMPARISON:  None Available. FINDINGS: Brain: There is no mass, hemorrhage or extra-axial collection. The size and configuration of the ventricles and extra-axial CSF spaces are normal. The brain parenchyma is normal, without acute or chronic  infarction. Small focus of pineal calcification. Mega cisterna magna. Vascular: No abnormal hyperdensity of the major intracranial arteries or dural venous sinuses. No intracranial atherosclerosis. Skull: Left frontal scalp hematoma without skull fracture. Sinuses/Orbits: No fluid levels or advanced mucosal thickening of the visualized paranasal sinuses. No mastoid or middle ear effusion. The orbits are normal. IMPRESSION: 1. No acute intracranial abnormality. 2. Left frontal scalp hematoma without skull fracture. Electronically Signed   By: Deatra Robinson M.D.   On: 06/13/2023 20:33   CT Cervical Spine Wo Contrast  Result Date: 06/13/2023 CLINICAL DATA:  Larey Seat down stairs EXAM: CT CERVICAL SPINE WITHOUT CONTRAST TECHNIQUE: Multidetector CT imaging of the cervical spine was performed without intravenous contrast. Multiplanar CT image reconstructions were also generated. RADIATION DOSE REDUCTION: This exam was performed according to the departmental dose-optimization program which includes automated exposure control, adjustment of the mA and/or kV according to patient size and/or use of iterative reconstruction technique. COMPARISON:  None Available. FINDINGS: Alignment: No subluxation.  Facet alignment within normal limits. Skull base and vertebrae: No acute fracture. No primary bone lesion or focal pathologic process. Soft tissues and spinal canal: No prevertebral fluid or swelling. No visible canal hematoma. Disc levels: Multilevel degenerative change with mild to moderate disc space narrowing C5-C6 and C6-C7. Upper chest: Negative. Other: None IMPRESSION: Degenerative changes. No CT evidence for acute osseous abnormality. Electronically Signed   By: Jasmine Pang M.D.   On: 06/13/2023 20:31      Assessment/Plan: Principal Problem:   Closed fracture of right proximal humerus Active Problems:   Right humeral fracture   Obesity, Class III, BMI 40-49.9 (morbid obesity) (HCC)   Obesity hypoventilation  syndrome (HCC)   Right supracondylar humerus fracture, closed, initial encounter   Hematoma of frontal scalp   Chronic acquired lymphedema    Assessment and Plan: Right humerus comminuted displaced fracture -Patient has a mechanical fall as mentioned above. - X-ray right shoulder showed severely comminuted displaced fracture in the head, neck and proximal shaft of right humerus.  Possible dislocation. X-ray right elbow Subtle lucency through the coronoid process of the ulna is favored chronic or projectional with acute fracture considered less likely given lack of elbow joint effusion. X-ray left knee no fracture or dislocation.  Degenerative changes noted of the medial compartment. X-ray left hand no fracture or dislocation. -CT humerus of the right side showed moderately comminuted fracture of right proximal humerus as described on the x-ray and oblique fracture of proximal right humerus diaphysis with 1 shaft width medial displacement and 2 to 3 cm over right upper humeral shaft. -Orthopedic surgeon Dr. Ave Filter has been consulted and aware of the patient. - Keep patient n.p.o. overnight. -Continue  oxycodone to 15 mg every 6 hours scheduled and hydromorphone 1 mg every 4 hours as needed for breakthrough pain. -Continue rivaroxaban 500 mg every 6 hours as needed for muscle spasm. -Holding pharmacological DVT prophylaxis for possible surgery in the morning - Will need to consult PT and OT following surgery. -Checking morning CBC, CMP, EKG, APTT and INR.  Left frontal scalp hematoma -CT head no acute intracranial abnormality.  Left frontal scalp hematoma without skull fracture. CT cervical spine showed degenerative change, no evidence of acute fracture. -Patient denying any headache and blurry vision. - Continue to monitor.  Mechanical fall - Head CT rule out any acute intracranial abnormality. - Will consult PT and OT after surgical procedure.  Obesity hypoventilation syndrome -  Resumed and continue albuterol every 6 hours as needed for shortness of breath.   Morbid obesity class IV - BMI 61 and weight 193 kg - Unable to counsel patient at the bedsid extensively as patient is in acute pain from humerus fracture and unable to pay attention.   DVT prophylaxis:    SCDs Code Status:    Full Code Diet:    Currently n.p.o. Family Communication:   Updated patient at the bedside. Disposition Plan: Plan to discharge to home versus skilled nursing facility in 2 to 3 days. Consults:   Orthopedic surgeon Admission status:   Inpatient, Med-Surg  Severity of Illness: The appropriate patient status for this patient is INPATIENT. Inpatient status is judged to be reasonable and necessary in order to provide the required intensity of service to ensure the patient's safety. The patient's presenting symptoms, physical exam findings, and initial radiographic and laboratory data in the context of their chronic comorbidities is felt to place them at high risk for further clinical deterioration. Furthermore, it is not anticipated that the patient will be medically stable for discharge from the hospital within 2 midnights of admission.   * I certify that at the point of admission it is my clinical judgment that the patient will require inpatient hospital care spanning beyond 2 midnights from the point of admission due to high intensity of service, high risk for further deterioration and high frequency of surveillance required.Marland Kitchen    Tereasa Coop MD [facility]  How to contact the [facility] Attending or Consulting provider 7A - 7P or covering provider during after hours 7P -7A, for this patient?   Check the care team in [facility] and look for a) attending/consulting [facility] provider listed and b) the [facility] team listed Log into [email or web address] and use [place]'s universal password to access. If you do not have the password, please contact the hospital operator. Locate the [facility] provider you are  looking for under [facility] and page to a number that you can be directly reached. If you still have difficulty reaching the provider, please page the [facility] (Director on Call) for the Hospitalists listed on amion for assistance.  06/14/2023, 5:39 AM

## 2023-06-15 ENCOUNTER — Encounter (HOSPITAL_COMMUNITY)
Admission: EM | Disposition: A | Discharge status: Home Health | Payer: Self-pay | Source: Other Acute Inpatient Hospital | Attending: Student

## 2023-06-15 DIAGNOSIS — S0003XA Contusion of scalp, initial encounter: Secondary | ICD-10-CM | POA: Diagnosis not present

## 2023-06-15 DIAGNOSIS — S42421A Displaced comminuted supracondylar fracture without intercondylar fracture of right humerus, initial encounter for closed fracture: Secondary | ICD-10-CM | POA: Diagnosis not present

## 2023-06-15 DIAGNOSIS — S42291A Other displaced fracture of upper end of right humerus, initial encounter for closed fracture: Secondary | ICD-10-CM | POA: Diagnosis not present

## 2023-06-15 LAB — SURGICAL PCR SCREEN
MRSA, PCR: POSITIVE — AB
Staphylococcus aureus: POSITIVE — AB

## 2023-06-15 LAB — RENAL FUNCTION PANEL
Albumin: 2.9 g/dL — ABNORMAL LOW (ref 3.5–5.0)
Anion gap: 9 (ref 5–15)
BUN: 5 mg/dL — ABNORMAL LOW (ref 6–20)
CO2: 29 mmol/L (ref 22–32)
Calcium: 8.5 mg/dL — ABNORMAL LOW (ref 8.9–10.3)
Chloride: 98 mmol/L (ref 98–111)
Creatinine, Ser: 0.75 mg/dL (ref 0.44–1.00)
GFR, Estimated: >60 mL/min (ref >60)
Glucose, Bld: 109 mg/dL — ABNORMAL HIGH (ref 70–99)
Phosphorus: 2.8 mg/dL (ref 2.5–4.6)
Potassium: 3.7 mmol/L (ref 3.5–5.1)
Sodium: 136 mmol/L (ref 135–145)

## 2023-06-15 LAB — CBC
HCT: 34.5 % — ABNORMAL LOW (ref 36.0–46.0)
Hemoglobin: 10.5 g/dL — ABNORMAL LOW (ref 12.0–15.0)
MCH: 26.2 pg (ref 26.0–34.0)
MCHC: 30.4 g/dL (ref 30.0–36.0)
MCV: 86 fL (ref 80.0–100.0)
Platelets: 192 10*3/uL (ref 150–400)
RBC: 4.01 MIL/uL (ref 3.87–5.11)
RDW: 15.2 % (ref 11.5–15.5)
WBC: 6.8 10*3/uL (ref 4.0–10.5)
nRBC: 0 % (ref 0.0–0.2)

## 2023-06-15 LAB — MAGNESIUM: Magnesium: 2 mg/dL (ref 1.7–2.4)

## 2023-06-15 SURGERY — OPEN REDUCTION INTERNAL FIXATION (ORIF) PROXIMAL HUMERUS FRACTURE
Anesthesia: General | Laterality: Right

## 2023-06-15 MED ORDER — CHLORHEXIDINE GLUCONATE CLOTH 2 % EX PADS
6.0000 | MEDICATED_PAD | Freq: Every day | CUTANEOUS | Status: AC
  Administered 2023-06-15 – 2023-06-19 (×5): 6 via TOPICAL

## 2023-06-15 MED ORDER — MUPIROCIN 2 % EX OINT
1.0000 | TOPICAL_OINTMENT | Freq: Two times a day (BID) | CUTANEOUS | Status: AC
  Administered 2023-06-15 – 2023-06-19 (×10): 1 via NASAL
  Filled 2023-06-15: qty 22

## 2023-06-15 MED ORDER — MUPIROCIN 2 % EX OINT
TOPICAL_OINTMENT | CUTANEOUS | Status: AC
  Filled 2023-06-15: qty 22

## 2023-06-15 NOTE — Progress Notes (Signed)
PROGRESS NOTE  Regina Snyder XBJ:[id number] DOB: 05/12/1976   PCP: Sherlyn Hay, DO  Patient is from: Home.  DOA: 06/14/2023 LOS: 1  Chief complaints No chief complaint on file.    Brief Narrative / Interim history: 47 year old F with PMH of morbid obesity and obesity hypoventilation presented to [facility] ED with right arm pain after she had accidental fall from her porch about 1-2 steps high, and found to have severely comminuted displaced fracture in the head, neck and proximal shaft of right humerus with possible dislocation as noted on x-ray and CT.  Orthopedic surgery [facility] recommended transfer to [facility] for trauma surgery evaluation and management.  CT head without acute intracranial abnormality but left frontal scalp hematoma without skull fracture.  CT cervical spine without acute finding.  Left knee x-ray and left hand x-ray without acute finding.  Right shoulder x-ray and CT right humerus showed comminuted fracture of right proximal humerus and oblique fracture of proximal right humerus diaphysis.   Plan for ORIF today    Subjective: Seen and examined earlier this morning. No major events overnight or this morning. Pain improved. No focal neuro symptoms.   Objective: Vitals:   06/14/23 1451 06/14/23 1947 06/15/23 0533 06/15/23 0700  BP: 118/81 120/74 127/74 128/69  Pulse: 72 79 75 78  Resp:  18 17 17   Temp: 97.9 F (36.6 C) 98.1 F (36.7 C)  98.1 F (36.7 C)  TempSrc: Oral Oral  Oral  SpO2: 99% 95% 97% 95%    Examination:  GENERAL: No apparent distress.  Nontoxic. HEENT: MMM.  Vision and hearing grossly intact. Bruising around left eye NECK: Supple.  No apparent JVD.  RESP:  No IWOB.  Fair aeration bilaterally. CVS:  RRR. Heart sounds normal.  ABD/GI/GU: BS+. Abd soft, NTND.  MSK/EXT:  Moves extremities.  Right arm in sling.  Neurovascular intact.  Bruising over left knee. SKIN: as above NEURO: Awake, alert and oriented appropriately.  No apparent focal  neuro deficit. PSYCH: Calm. Normal affect.   Procedures:  None  Microbiology summarized: None  Assessment and plan: Principal Problem:   Closed fracture of right proximal humerus Active Problems:   Right humeral fracture   Obesity, Class III, BMI 40-49.9 (morbid obesity) (HCC)   Obesity hypoventilation syndrome (HCC)   Right supracondylar humerus fracture, closed, initial encounter   Hematoma of frontal scalp   Chronic acquired lymphedema   Traumatic right humerus comminuted displaced fracture due to mechanical fall -Plan for ORIF today -Pain control  Left frontal scalp hematoma: CT head without acute intracranial abnormality of skull fracture. -Supportive care   Obesity hypoventilation syndrome: Not on CPAP or BiPAP. Reports snoring but never checked for OSA -Would be cautious with opiate due to risk for respiratory depression and CO2 retention.   Morbid obesity: BMI 61.27. -Lifestyle change to lose weight -She may benefit from GLP-1 agonist or bariatric surgery outpatient.  Nausea: Likely from pain meds -Antiemetics as needed         DVT prophylaxis:  SCDs Start: 06/14/23 0536 SCDs Start: 06/14/23 0411  Code Status: Full code Family Communication: None at bedside Level of care: Med-Surg Status is: Inpatient Remains inpatient appropriate because: Right humeral fracture   Final disposition: TBD Consultants:  Orthopedic surgery  35 minutes with more than 50% spent in reviewing records, counseling patient/family and coordinating care.   Sch Meds:  Scheduled Meds:  Chlorhexidine Gluconate Cloth  6 each Topical Q0600   mupirocin ointment  1 Application Nasal BID  oxyCODONE  10-15 mg Oral Q6H   sodium chloride flush  3 mL Intravenous Q12H   sodium chloride flush  3 mL Intravenous Q12H   Continuous Infusions:  sodium chloride Stopped (06/14/23 1557)   sodium chloride     methocarbamol (ROBAXIN) IV     PRN Meds:.sodium chloride, acetaminophen **OR**  acetaminophen, albuterol, hydrALAZINE, HYDROmorphone (DILAUDID) injection, methocarbamol **OR** methocarbamol (ROBAXIN) IV, ondansetron **OR** ondansetron (ZOFRAN) IV, prochlorperazine, senna-docusate, sodium chloride flush  Antimicrobials: Anti-infectives (From admission, onward)    Start     Dose/Rate Route Frequency Ordered Stop   06/14/23 0900  ceFAZolin (ANCEF) IVPB 3g/100 mL premix        3 g 200 mL/hr over 30 Minutes Intravenous On call to O.R. 06/14/23 0802 06/15/23 0559        I have personally reviewed the following labs and images: CBC: Recent Labs  Lab 06/14/23 0117 06/14/23 1119 06/15/23 0500  WBC 11.1* 7.5 6.8  NEUTROABS 9.6*  --   --   HGB 10.9* 10.5* 10.5*  HCT 36.2 33.6* 34.5*  MCV 86.2 85.1 86.0  PLT 208 196 192   BMP &GFR Recent Labs  Lab 06/14/23 0117 06/14/23 1119 06/15/23 0500  NA 136 134* 136  K 3.7 3.6 3.7  CL 103 102 98  CO2 26 26 29   GLUCOSE 136* 127* 109*  BUN 11 7 5*  CREATININE 0.68 0.60 0.75  CALCIUM 8.3* 8.2* 8.5*  MG  --   --  2.0  PHOS  --   --  2.8   Estimated Creatinine Clearance: 159.3 mL/min (by C-G formula based on SCr of 0.75 mg/dL). Liver & Pancreas: Recent Labs  Lab 06/14/23 1119 06/15/23 0500  AST 15  --   ALT 23  --   ALKPHOS 55  --   BILITOT 1.1  --   PROT 6.1*  --   ALBUMIN 2.9* 2.9*   No results for input(s): "LIPASE", "AMYLASE" in the last 168 hours. No results for input(s): "AMMONIA" in the last 168 hours. Diabetic: No results for input(s): "HGBA1C" in the last 72 hours. No results for input(s): "GLUCAP" in the last 168 hours. Cardiac Enzymes: No results for input(s): "CKTOTAL", "CKMB", "CKMBINDEX", "TROPONINI" in the last 168 hours. No results for input(s): "PROBNP" in the last 8760 hours. Coagulation Profile: Recent Labs  Lab 06/14/23 1119  INR 1.1   Thyroid Function Tests: No results for input(s): "TSH", "T4TOTAL", "FREET4", "T3FREE", "THYROIDAB" in the last 72 hours. Lipid Profile: No  results for input(s): "CHOL", "HDL", "LDLCALC", "TRIG", "CHOLHDL", "LDLDIRECT" in the last 72 hours. Anemia Panel: No results for input(s): "VITAMINB12", "FOLATE", "FERRITIN", "TIBC", "IRON", "RETICCTPCT" in the last 72 hours. Urine analysis:    Component Value Date/Time   BILIRUBINUR negative 04/29/2020 1414   PROTEINUR Negative 04/29/2020 1414   UROBILINOGEN 0.2 04/29/2020 1414   NITRITE negative 04/29/2020 1414   LEUKOCYTESUR Negative 04/29/2020 1414   Sepsis Labs: Invalid input(s): "PROCALCITONIN", "LACTICIDVEN"  Microbiology: Recent Results (from the past 240 hour(s))  Surgical pcr screen     Status: Abnormal   Collection Time: 06/15/23  5:57 AM   Specimen: Nasal Mucosa; Nasal Swab  Result Value Ref Range Status   MRSA, PCR POSITIVE (A) NEGATIVE Final    Comment: RESULT CALLED TO, READ BACK BY AND VERIFIED WITH: RN ASHLEY ON [id number] @1004  BY SM    Staphylococcus aureus POSITIVE (A) NEGATIVE Final    Comment: (NOTE) The Xpert SA Assay (FDA approved for NASAL specimens in patients 22 years  of age and older), is one component of a comprehensive surveillance program. It is not intended to diagnose infection nor to guide or monitor treatment. Performed at [facility], [address], [place], [place] [address]     Radiology Studies: No results found.    Makaylah Oddo T. Waleed Dettman [facility]  If 7PM-7AM, please contact night-coverage [email or web address] 06/15/2023, 12:39 PM

## 2023-06-15 NOTE — Plan of Care (Signed)
progressing 

## 2023-06-15 NOTE — Progress Notes (Signed)
[facility]   Due to unforseen computer software issues that are affecting many systems in the hospital, including the OR, patients surgical case for today has been postponed. OR only allowing emergent cases only.  Plan as of now is for Dr. Ave Filter to fix Monday afternoon (06/18/2023)  Pt may eat  NPO 0001 06/18/2023  Mearl Latin, PA-C [phone] (C) 06/15/2023, 10:19 AM  [facility] [address] [place] [address] [phone] (F)      Patient ID: Regina Snyder, female   DOB: 1975/12/30, 47 y.o.   MRN: [id number]

## 2023-06-16 DIAGNOSIS — S0003XA Contusion of scalp, initial encounter: Secondary | ICD-10-CM | POA: Diagnosis not present

## 2023-06-16 DIAGNOSIS — S42291A Other displaced fracture of upper end of right humerus, initial encounter for closed fracture: Secondary | ICD-10-CM | POA: Diagnosis not present

## 2023-06-16 DIAGNOSIS — S42421A Displaced comminuted supracondylar fracture without intercondylar fracture of right humerus, initial encounter for closed fracture: Secondary | ICD-10-CM | POA: Diagnosis not present

## 2023-06-16 LAB — RENAL FUNCTION PANEL
Albumin: 2.7 g/dL — ABNORMAL LOW (ref 3.5–5.0)
Anion gap: 8 (ref 5–15)
BUN: 6 mg/dL (ref 6–20)
CO2: 29 mmol/L (ref 22–32)
Calcium: 8.3 mg/dL — ABNORMAL LOW (ref 8.9–10.3)
Chloride: 99 mmol/L (ref 98–111)
Creatinine, Ser: 0.67 mg/dL (ref 0.44–1.00)
GFR, Estimated: >60 mL/min (ref >60)
Glucose, Bld: 111 mg/dL — ABNORMAL HIGH (ref 70–99)
Phosphorus: 3.2 mg/dL (ref 2.5–4.6)
Potassium: 3.6 mmol/L (ref 3.5–5.1)
Sodium: 136 mmol/L (ref 135–145)

## 2023-06-16 LAB — CBC
HCT: 32.4 % — ABNORMAL LOW (ref 36.0–46.0)
Hemoglobin: 9.9 g/dL — ABNORMAL LOW (ref 12.0–15.0)
MCH: 26.3 pg (ref 26.0–34.0)
MCHC: 30.6 g/dL (ref 30.0–36.0)
MCV: 86.2 fL (ref 80.0–100.0)
Platelets: 196 10*3/uL (ref 150–400)
RBC: 3.76 MIL/uL — ABNORMAL LOW (ref 3.87–5.11)
RDW: 15.3 % (ref 11.5–15.5)
WBC: 8 10*3/uL (ref 4.0–10.5)
nRBC: 0 % (ref 0.0–0.2)

## 2023-06-16 LAB — MAGNESIUM: Magnesium: 1.9 mg/dL (ref 1.7–2.4)

## 2023-06-16 NOTE — Progress Notes (Signed)
PROGRESS NOTE  Regina Snyder:[id number] DOB: 1976-02-26   PCP: Sherlyn Hay, DO  Patient is from: Home.  DOA: 06/14/2023 LOS: 2  Chief complaints No chief complaint on file.    Brief Narrative / Interim history: 47 year old F with PMH of morbid obesity and obesity hypoventilation presented to [facility] ED with right arm pain after she had accidental fall from her porch about 1-2 steps high, and found to have severely comminuted displaced fracture in the head, neck and proximal shaft of right humerus with possible dislocation as noted on x-ray and CT.  Orthopedic surgery [facility] recommended transfer to [facility] for trauma surgery evaluation and management.  CT head without acute intracranial abnormality but left frontal scalp hematoma without skull fracture.  CT cervical spine without acute finding.  Left knee x-ray and left hand x-ray without acute finding.  Right shoulder x-ray and CT right humerus showed comminuted fracture of right proximal humerus and oblique fracture of proximal right humerus diaphysis.   Surgery postponed Monday, 7/22 due to unforeseen computer software issues   Subjective: Seen and examined earlier this morning.  No major events overnight of this morning.  No complaints.  Pain fairly controlled.  Tolerated breakfast well.  Objective: Vitals:   06/16/23 0436 06/16/23 0500 06/16/23 0906 06/16/23 1100  BP: 125/74  123/73   Pulse: 88  91   Resp: 16  15   Temp: 98.2 F (36.8 C)  98.5 F (36.9 C)   TempSrc:   Axillary   SpO2: 96%  96%   Weight:  (!) 189.9 kg    Height:    5\' 10"  (1.778 m)    Examination:  GENERAL: No apparent distress.  Nontoxic. HEENT: MMM.  Vision and hearing grossly intact.  NECK: Supple.  No apparent JVD.  RESP:  No IWOB.  Fair aeration bilaterally. CVS:  RRR. Heart sounds normal.  ABD/GI/GU: BS+. Abd soft, NTND.  MSK/EXT:  Moves extremities.  Right arm in sling.  Neurovascular intact.  Bruising over left knee. SKIN: no apparent  skin lesion or wound NEURO: Awake, alert and oriented appropriately.  No apparent focal neuro deficit. PSYCH: Calm. Normal affect.   Procedures:  None  Microbiology summarized: None  Assessment and plan: Principal Problem:   Closed fracture of right proximal humerus Active Problems:   Right humeral fracture   Obesity, Class III, BMI 40-49.9 (morbid obesity) (HCC)   Obesity hypoventilation syndrome (HCC)   Right supracondylar humerus fracture, closed, initial encounter   Hematoma of frontal scalp   Chronic acquired lymphedema   Traumatic right humerus comminuted displaced fracture due to mechanical fall -Surgery postponed to 7/22 due to software issues affecting system -Pain control  Left frontal scalp hematoma: CT head without acute intracranial abnormality of skull fracture. -Supportive care   Obesity hypoventilation syndrome: Not on CPAP or BiPAP. -Would be cautious with opiate due to risk for respiratory depression and CO2 retention.     Morbid obesity: BMI 61.27. -Lifestyle change to lose weight -She may benefit from GLP-1 agonist or bariatric surgery outpatient.  Nausea: Likely from pain meds.  Improved. -Antiemetics as needed         DVT prophylaxis:  SCDs Start: 06/14/23 0536 SCDs Start: 06/14/23 0411  Code Status: Full code Family Communication: None at bedside Level of care: Med-Surg Status is: Inpatient Remains inpatient appropriate because: Right humeral fracture   Final disposition: TBD Consultants:  Orthopedic surgery  35 minutes with more than 50% spent in reviewing records, counseling patient/family  and coordinating care.   Sch Meds:  Scheduled Meds:  Chlorhexidine Gluconate Cloth  6 each Topical Q0600   mupirocin ointment  1 Application Nasal BID   oxyCODONE  10-15 mg Oral Q6H   sodium chloride flush  3 mL Intravenous Q12H   sodium chloride flush  3 mL Intravenous Q12H   Continuous Infusions:  sodium chloride     methocarbamol  (ROBAXIN) IV     PRN Meds:.sodium chloride, acetaminophen **OR** acetaminophen, albuterol, hydrALAZINE, HYDROmorphone (DILAUDID) injection, methocarbamol **OR** methocarbamol (ROBAXIN) IV, ondansetron **OR** ondansetron (ZOFRAN) IV, prochlorperazine, senna-docusate, sodium chloride flush  Antimicrobials: Anti-infectives (From admission, onward)    Start     Dose/Rate Route Frequency Ordered Stop   06/14/23 0900  ceFAZolin (ANCEF) IVPB 3g/100 mL premix        3 g 200 mL/hr over 30 Minutes Intravenous On call to O.R. 06/14/23 0802 06/15/23 0559        I have personally reviewed the following labs and images: CBC: Recent Labs  Lab 06/14/23 0117 06/14/23 1119 06/15/23 0500 06/16/23 0201  WBC 11.1* 7.5 6.8 8.0  NEUTROABS 9.6*  --   --   --   HGB 10.9* 10.5* 10.5* 9.9*  HCT 36.2 33.6* 34.5* 32.4*  MCV 86.2 85.1 86.0 86.2  PLT 208 196 192 196   BMP &GFR Recent Labs  Lab 06/14/23 0117 06/14/23 1119 06/15/23 0500 06/16/23 0201  NA 136 134* 136 136  K 3.7 3.6 3.7 3.6  CL 103 102 98 99  CO2 26 26 29 29   GLUCOSE 136* 127* 109* 111*  BUN 11 7 5* 6  CREATININE 0.68 0.60 0.75 0.67  CALCIUM 8.3* 8.2* 8.5* 8.3*  MG  --   --  2.0 1.9  PHOS  --   --  2.8 3.2   Estimated Creatinine Clearance: 157.3 mL/min (by C-G formula based on SCr of 0.67 mg/dL). Liver & Pancreas: Recent Labs  Lab 06/14/23 1119 06/15/23 0500 06/16/23 0201  AST 15  --   --   ALT 23  --   --   ALKPHOS 55  --   --   BILITOT 1.1  --   --   PROT 6.1*  --   --   ALBUMIN 2.9* 2.9* 2.7*   No results for input(s): "LIPASE", "AMYLASE" in the last 168 hours. No results for input(s): "AMMONIA" in the last 168 hours. Diabetic: No results for input(s): "HGBA1C" in the last 72 hours. No results for input(s): "GLUCAP" in the last 168 hours. Cardiac Enzymes: No results for input(s): "CKTOTAL", "CKMB", "CKMBINDEX", "TROPONINI" in the last 168 hours. No results for input(s): "PROBNP" in the last 8760  hours. Coagulation Profile: Recent Labs  Lab 06/14/23 1119  INR 1.1   Thyroid Function Tests: No results for input(s): "TSH", "T4TOTAL", "FREET4", "T3FREE", "THYROIDAB" in the last 72 hours. Lipid Profile: No results for input(s): "CHOL", "HDL", "LDLCALC", "TRIG", "CHOLHDL", "LDLDIRECT" in the last 72 hours. Anemia Panel: No results for input(s): "VITAMINB12", "FOLATE", "FERRITIN", "TIBC", "IRON", "RETICCTPCT" in the last 72 hours. Urine analysis:    Component Value Date/Time   BILIRUBINUR negative 04/29/2020 1414   PROTEINUR Negative 04/29/2020 1414   UROBILINOGEN 0.2 04/29/2020 1414   NITRITE negative 04/29/2020 1414   LEUKOCYTESUR Negative 04/29/2020 1414   Sepsis Labs: Invalid input(s): "PROCALCITONIN", "LACTICIDVEN"  Microbiology: Recent Results (from the past 240 hour(s))  Surgical pcr screen     Status: Abnormal   Collection Time: 06/15/23  5:57 AM   Specimen: Nasal Mucosa;  Nasal Swab  Result Value Ref Range Status   MRSA, PCR POSITIVE (A) NEGATIVE Final    Comment: RESULT CALLED TO, READ BACK BY AND VERIFIED WITH: RN ASHLEY ON [id number] @1004  BY SM    Staphylococcus aureus POSITIVE (A) NEGATIVE Final    Comment: (NOTE) The Xpert SA Assay (FDA approved for NASAL specimens in patients 71 years of age and older), is one component of a comprehensive surveillance program. It is not intended to diagnose infection nor to guide or monitor treatment. Performed at [facility], [address], [place], [place] [address]     Radiology Studies: No results found.    Tran Arzuaga T. Lupe Handley [facility]  If 7PM-7AM, please contact night-coverage [email or web address] 06/16/2023, 1:43 PM

## 2023-06-16 NOTE — Plan of Care (Signed)

## 2023-06-17 DIAGNOSIS — S42291A Other displaced fracture of upper end of right humerus, initial encounter for closed fracture: Secondary | ICD-10-CM | POA: Diagnosis not present

## 2023-06-17 DIAGNOSIS — S42421A Displaced comminuted supracondylar fracture without intercondylar fracture of right humerus, initial encounter for closed fracture: Secondary | ICD-10-CM | POA: Diagnosis not present

## 2023-06-17 DIAGNOSIS — S0003XA Contusion of scalp, initial encounter: Secondary | ICD-10-CM | POA: Diagnosis not present

## 2023-06-17 NOTE — Progress Notes (Signed)
PROGRESS NOTE  Regina Snyder ZOX:[id number] DOB: [birth year]   PCP: Sherlyn Hay, DO  Patient is from: Home.  DOA: 06/14/2023 LOS: 3  Chief complaints No chief complaint on file.    Brief Narrative / Interim history: 47 year old F with PMH of morbid obesity and obesity hypoventilation presented to [facility] ED with right arm pain after she had accidental fall from her porch about 1-2 steps high, and found to have severely comminuted displaced fracture in the head, neck and proximal shaft of right humerus with possible dislocation as noted on x-ray and CT.  Orthopedic surgery [facility] recommended transfer to [facility] for trauma surgery evaluation and management.  CT head without acute intracranial abnormality but left frontal scalp hematoma without skull fracture.  CT cervical spine without acute finding.  Left knee x-ray and left hand x-ray without acute finding.  Right shoulder x-ray and CT right humerus showed comminuted fracture of right proximal humerus and oblique fracture of proximal right humerus diaphysis.   Surgery postponed Monday, 7/22 due to unforeseen computer software issues   Subjective: Seen and examined earlier this morning.  No major events overnight of this morning.  No complaints.  Pain fairly controlled.  Anxious about surgery.  Objective: Vitals:   06/16/23 1100 06/16/23 1708 06/16/23 2229 06/17/23 0524  BP:  139/74 135/82 115/70  Pulse:  80 79 77  Resp:  16 16 16   Temp:   98.5 F (36.9 C) 98.2 F (36.8 C)  TempSrc:   Oral Oral  SpO2:  94% 96% 92%  Weight:      Height: 5\' 10"  (1.778 m)       Examination:  GENERAL: No apparent distress.  Nontoxic. HEENT: MMM.  Vision and hearing grossly intact.  NECK: Supple.  No apparent JVD.  RESP:  No IWOB.  Fair aeration bilaterally. CVS:  RRR. Heart sounds normal.  ABD/GI/GU: BS+. Abd soft, NTND.  MSK/EXT:  Moves extremities.  Right arm in sling.  Neurovascular intact.  Bruising over left knee. SKIN: no apparent skin  lesion or wound NEURO: Awake, alert and oriented appropriately.  No apparent focal neuro deficit. PSYCH: Calm. Normal affect.   Procedures:  None  Microbiology summarized: None  Assessment and plan: Principal Problem:   Closed fracture of right proximal humerus Active Problems:   Right humeral fracture   Obesity, Class III, BMI 40-49.9 (morbid obesity) (HCC)   Obesity hypoventilation syndrome (HCC)   Right supracondylar humerus fracture, closed, initial encounter   Hematoma of frontal scalp   Chronic acquired lymphedema   Traumatic right humerus comminuted displaced fracture due to mechanical fall -Surgery postponed to 7/22 due to software issues affecting system -Pain control  Left frontal scalp hematoma: CT head without acute intracranial abnormality of skull fracture. -Supportive care   Obesity hypoventilation syndrome? Not on CPAP or BiPAP. -Would be cautious with opiate due to risk for respiratory depression and CO2 retention.   Morbid obesity: BMI 61.27. -Lifestyle change to lose weight -She may benefit from GLP-1 agonist or bariatric surgery outpatient.  Nausea: Likely from pain meds.  Resolved. -Antiemetics as needed         DVT prophylaxis:  SCDs Start: 06/14/23 0536 SCDs Start: 06/14/23 0411  Code Status: Full code Family Communication: None at bedside Level of care: Med-Surg Status is: Inpatient Remains inpatient appropriate because: Right humeral fracture   Final disposition: Likely home after surgery Consultants:  Orthopedic surgery  35 minutes with more than 50% spent in reviewing records, counseling patient/family and  coordinating care.   Sch Meds:  Scheduled Meds:  Chlorhexidine Gluconate Cloth  6 each Topical Q0600   mupirocin ointment  1 Application Nasal BID   oxyCODONE  10-15 mg Oral Q6H   sodium chloride flush  3 mL Intravenous Q12H   sodium chloride flush  3 mL Intravenous Q12H   Continuous Infusions:  sodium chloride      methocarbamol (ROBAXIN) IV     PRN Meds:.sodium chloride, acetaminophen **OR** acetaminophen, albuterol, hydrALAZINE, HYDROmorphone (DILAUDID) injection, methocarbamol **OR** methocarbamol (ROBAXIN) IV, ondansetron **OR** ondansetron (ZOFRAN) IV, prochlorperazine, senna-docusate, sodium chloride flush  Antimicrobials: Anti-infectives (From admission, onward)    Start     Dose/Rate Route Frequency Ordered Stop   06/14/23 0900  ceFAZolin (ANCEF) IVPB 3g/100 mL premix        3 g 200 mL/hr over 30 Minutes Intravenous On call to O.R. 06/14/23 0802 06/15/23 0559        I have personally reviewed the following labs and images: CBC: Recent Labs  Lab 06/14/23 0117 06/14/23 1119 06/15/23 0500 06/16/23 0201  WBC 11.1* 7.5 6.8 8.0  NEUTROABS 9.6*  --   --   --   HGB 10.9* 10.5* 10.5* 9.9*  HCT 36.2 33.6* 34.5* 32.4*  MCV 86.2 85.1 86.0 86.2  PLT 208 196 192 196   BMP &GFR Recent Labs  Lab 06/14/23 0117 06/14/23 1119 06/15/23 0500 06/16/23 0201  NA 136 134* 136 136  K 3.7 3.6 3.7 3.6  CL 103 102 98 99  CO2 26 26 29 29   GLUCOSE 136* 127* 109* 111*  BUN 11 7 5* 6  CREATININE 0.68 0.60 0.75 0.67  CALCIUM 8.3* 8.2* 8.5* 8.3*  MG  --   --  2.0 1.9  PHOS  --   --  2.8 3.2   Estimated Creatinine Clearance: 157.3 mL/min (by C-G formula based on SCr of 0.67 mg/dL). Liver & Pancreas: Recent Labs  Lab 06/14/23 1119 06/15/23 0500 06/16/23 0201  AST 15  --   --   ALT 23  --   --   ALKPHOS 55  --   --   BILITOT 1.1  --   --   PROT 6.1*  --   --   ALBUMIN 2.9* 2.9* 2.7*   No results for input(s): "LIPASE", "AMYLASE" in the last 168 hours. No results for input(s): "AMMONIA" in the last 168 hours. Diabetic: No results for input(s): "HGBA1C" in the last 72 hours. No results for input(s): "GLUCAP" in the last 168 hours. Cardiac Enzymes: No results for input(s): "CKTOTAL", "CKMB", "CKMBINDEX", "TROPONINI" in the last 168 hours. No results for input(s): "PROBNP" in the last 8760  hours. Coagulation Profile: Recent Labs  Lab 06/14/23 1119  INR 1.1   Thyroid Function Tests: No results for input(s): "TSH", "T4TOTAL", "FREET4", "T3FREE", "THYROIDAB" in the last 72 hours. Lipid Profile: No results for input(s): "CHOL", "HDL", "LDLCALC", "TRIG", "CHOLHDL", "LDLDIRECT" in the last 72 hours. Anemia Panel: No results for input(s): "VITAMINB12", "FOLATE", "FERRITIN", "TIBC", "IRON", "RETICCTPCT" in the last 72 hours. Urine analysis:    Component Value Date/Time   BILIRUBINUR negative 04/29/2020 1414   PROTEINUR Negative 04/29/2020 1414   UROBILINOGEN 0.2 04/29/2020 1414   NITRITE negative 04/29/2020 1414   LEUKOCYTESUR Negative 04/29/2020 1414   Sepsis Labs: Invalid input(s): "PROCALCITONIN", "LACTICIDVEN"  Microbiology: Recent Results (from the past 240 hour(s))  Surgical pcr screen     Status: Abnormal   Collection Time: 06/15/23  5:57 AM   Specimen: Nasal Mucosa; Nasal  Swab  Result Value Ref Range Status   MRSA, PCR POSITIVE (A) NEGATIVE Final    Comment: RESULT CALLED TO, READ BACK BY AND VERIFIED WITH: RN ASHLEY ON [id number] @1004  BY SM    Staphylococcus aureus POSITIVE (A) NEGATIVE Final    Comment: (NOTE) The Xpert SA Assay (FDA approved for NASAL specimens in patients 31 years of age and older), is one component of a comprehensive surveillance program. It is not intended to diagnose infection nor to guide or monitor treatment. Performed at [facility], [address], [place], [place] [address]     Radiology Studies: No results found.    Rogerio Boutelle T. Olly Shiner [facility]  If 7PM-7AM, please contact night-coverage [email or web address] 06/17/2023, 11:51 AM

## 2023-06-18 ENCOUNTER — Inpatient Hospital Stay (HOSPITAL_COMMUNITY): Payer: BC Managed Care – PPO

## 2023-06-18 ENCOUNTER — Encounter (HOSPITAL_COMMUNITY)
Admission: EM | Disposition: A | Discharge status: Home Health | Payer: Self-pay | Source: Other Acute Inpatient Hospital | Attending: Student

## 2023-06-18 ENCOUNTER — Encounter (HOSPITAL_COMMUNITY): Payer: Self-pay | Admitting: Internal Medicine

## 2023-06-18 ENCOUNTER — Inpatient Hospital Stay (HOSPITAL_COMMUNITY): Payer: BC Managed Care – PPO | Admitting: Certified Registered Nurse Anesthetist

## 2023-06-18 DIAGNOSIS — S42291A Other displaced fracture of upper end of right humerus, initial encounter for closed fracture: Secondary | ICD-10-CM | POA: Diagnosis not present

## 2023-06-18 DIAGNOSIS — S42421A Displaced comminuted supracondylar fracture without intercondylar fracture of right humerus, initial encounter for closed fracture: Secondary | ICD-10-CM | POA: Diagnosis not present

## 2023-06-18 DIAGNOSIS — S0003XA Contusion of scalp, initial encounter: Secondary | ICD-10-CM | POA: Diagnosis not present

## 2023-06-18 HISTORY — PX: ORIF HUMERUS FRACTURE: SHX2126

## 2023-06-18 LAB — COMPREHENSIVE METABOLIC PANEL
ALT: 39 U/L (ref 0–44)
AST: 34 U/L (ref 15–41)
Albumin: 2.6 g/dL — ABNORMAL LOW (ref 3.5–5.0)
Alkaline Phosphatase: 58 U/L (ref 38–126)
Anion gap: 8 (ref 5–15)
BUN: 7 mg/dL (ref 6–20)
CO2: 31 mmol/L (ref 22–32)
Calcium: 8.4 mg/dL — ABNORMAL LOW (ref 8.9–10.3)
Chloride: 96 mmol/L — ABNORMAL LOW (ref 98–111)
Creatinine, Ser: 0.65 mg/dL (ref 0.44–1.00)
GFR, Estimated: >60 mL/min (ref >60)
Glucose, Bld: 108 mg/dL — ABNORMAL HIGH (ref 70–99)
Potassium: 3.2 mmol/L — ABNORMAL LOW (ref 3.5–5.1)
Sodium: 135 mmol/L (ref 135–145)
Total Bilirubin: 2.2 mg/dL — ABNORMAL HIGH (ref 0.3–1.2)
Total Protein: 6 g/dL — ABNORMAL LOW (ref 6.5–8.1)

## 2023-06-18 LAB — CBC
HCT: 33 % — ABNORMAL LOW (ref 36.0–46.0)
Hemoglobin: 10.3 g/dL — ABNORMAL LOW (ref 12.0–15.0)
MCH: 27.2 pg (ref 26.0–34.0)
MCHC: 31.2 g/dL (ref 30.0–36.0)
MCV: 87.1 fL (ref 80.0–100.0)
Platelets: 216 10*3/uL (ref 150–400)
RBC: 3.79 MIL/uL — ABNORMAL LOW (ref 3.87–5.11)
RDW: 15.2 % (ref 11.5–15.5)
WBC: 7.1 10*3/uL (ref 4.0–10.5)
nRBC: 0 % (ref 0.0–0.2)

## 2023-06-18 LAB — PHOSPHORUS: Phosphorus: 3.5 mg/dL (ref 2.5–4.6)

## 2023-06-18 LAB — POCT PREGNANCY, URINE: Preg Test, Ur: NEGATIVE

## 2023-06-18 LAB — MAGNESIUM: Magnesium: 1.8 mg/dL (ref 1.7–2.4)

## 2023-06-18 LAB — CK: Total CK: 172 U/L (ref 38–234)

## 2023-06-18 SURGERY — OPEN REDUCTION INTERNAL FIXATION (ORIF) PROXIMAL HUMERUS FRACTURE
Anesthesia: Regional | Site: Arm Upper | Laterality: Right

## 2023-06-18 MED ORDER — SUCCINYLCHOLINE CHLORIDE 200 MG/10ML IV SOSY
PREFILLED_SYRINGE | INTRAVENOUS | Status: DC | PRN
  Administered 2023-06-18: 180 mg via INTRAVENOUS

## 2023-06-18 MED ORDER — OXYCODONE HCL 5 MG PO TABS: 5.0000 mg | ORAL_TABLET | Freq: Once | ORAL | Status: DC | PRN

## 2023-06-18 MED ORDER — ACETAMINOPHEN 10 MG/ML IV SOLN: 1000.0000 mg | Freq: Once | INTRAVENOUS | Status: DC | PRN

## 2023-06-18 MED ORDER — SODIUM CHLORIDE 0.9 % IV SOLN: INTRAVENOUS | Status: DC

## 2023-06-18 MED ORDER — PROPOFOL 10 MG/ML IV BOLUS
INTRAVENOUS | Status: DC | PRN
  Administered 2023-06-18: 200 mg via INTRAVENOUS

## 2023-06-18 MED ORDER — FENTANYL CITRATE (PF) 250 MCG/5ML IJ SOLN
INTRAMUSCULAR | Status: DC | PRN
  Administered 2023-06-18 (×2): 50 ug via INTRAVENOUS

## 2023-06-18 MED ORDER — DOCUSATE SODIUM 100 MG PO CAPS
100.0000 mg | ORAL_CAPSULE | Freq: Two times a day (BID) | ORAL | Status: DC
  Administered 2023-06-18 – 2023-06-20 (×4): 100 mg via ORAL
  Filled 2023-06-18 (×4): qty 1

## 2023-06-18 MED ORDER — OXYCODONE HCL 5 MG/5ML PO SOLN: 5.0000 mg | Freq: Once | ORAL | Status: DC | PRN

## 2023-06-18 MED ORDER — MIDAZOLAM HCL 2 MG/2ML IJ SOLN
INTRAMUSCULAR | Status: DC | PRN
  Administered 2023-06-18: 2 mg via INTRAVENOUS

## 2023-06-18 MED ORDER — CHLORHEXIDINE GLUCONATE 4 % EX SOLN: 1.0000 | CUTANEOUS | 1 refills | Status: DC

## 2023-06-18 MED ORDER — ONDANSETRON HCL 4 MG/2ML IJ SOLN
INTRAMUSCULAR | Status: DC | PRN
  Administered 2023-06-18: 4 mg via INTRAVENOUS

## 2023-06-18 MED ORDER — ONDANSETRON HCL 4 MG/2ML IJ SOLN
INTRAMUSCULAR | Status: AC
  Filled 2023-06-18: qty 2

## 2023-06-18 MED ORDER — ACETAMINOPHEN 160 MG/5ML PO SOLN: 1000.0000 mg | Freq: Once | ORAL | Status: DC | PRN

## 2023-06-18 MED ORDER — FENTANYL CITRATE (PF) 100 MCG/2ML IJ SOLN: 25.0000 ug | INTRAMUSCULAR | Status: DC | PRN

## 2023-06-18 MED ORDER — 0.9 % SODIUM CHLORIDE (POUR BTL) OPTIME
TOPICAL | Status: DC | PRN
  Administered 2023-06-18: 1000 mL

## 2023-06-18 MED ORDER — POTASSIUM CHLORIDE 10 MEQ/100ML IV SOLN
10.0000 meq | INTRAVENOUS | Status: AC
  Administered 2023-06-18 (×4): 10 meq via INTRAVENOUS
  Filled 2023-06-18 (×4): qty 100

## 2023-06-18 MED ORDER — ONDANSETRON HCL 4 MG/2ML IJ SOLN: 4.0000 mg | Freq: Four times a day (QID) | INTRAMUSCULAR | Status: DC | PRN

## 2023-06-18 MED ORDER — ORAL CARE MOUTH RINSE: 15.0000 mL | Freq: Once | OROMUCOSAL | Status: DC

## 2023-06-18 MED ORDER — DEXAMETHASONE SODIUM PHOSPHATE 10 MG/ML IJ SOLN
INTRAMUSCULAR | Status: DC | PRN
  Administered 2023-06-18: 10 mg via INTRAVENOUS

## 2023-06-18 MED ORDER — BUPIVACAINE-EPINEPHRINE (PF) 0.5% -1:200000 IJ SOLN
INTRAMUSCULAR | Status: DC | PRN
  Administered 2023-06-18: 20 mL via PERINEURAL

## 2023-06-18 MED ORDER — SUGAMMADEX SODIUM 200 MG/2ML IV SOLN
INTRAVENOUS | Status: DC | PRN
  Administered 2023-06-18: 400 mg via INTRAVENOUS

## 2023-06-18 MED ORDER — MUPIROCIN 2 % EX OINT: 1.0000 | TOPICAL_OINTMENT | Freq: Two times a day (BID) | CUTANEOUS | 0 refills | Status: DC

## 2023-06-18 MED ORDER — BUPIVACAINE-EPINEPHRINE (PF) 0.25% -1:200000 IJ SOLN
INTRAMUSCULAR | Status: AC
  Filled 2023-06-18: qty 30

## 2023-06-18 MED ORDER — BUPIVACAINE LIPOSOME 1.3 % IJ SUSP
INTRAMUSCULAR | Status: DC | PRN
  Administered 2023-06-18: 10 mL via PERINEURAL

## 2023-06-18 MED ORDER — ACETAMINOPHEN 500 MG PO TABS: 1000.0000 mg | ORAL_TABLET | Freq: Once | ORAL | Status: DC | PRN

## 2023-06-18 MED ORDER — BUPIVACAINE-EPINEPHRINE (PF) 0.5% -1:200000 IJ SOLN
INTRAMUSCULAR | Status: AC
  Filled 2023-06-18: qty 30

## 2023-06-18 MED ORDER — ONDANSETRON HCL 4 MG PO TABS: 4.0000 mg | ORAL_TABLET | Freq: Four times a day (QID) | ORAL | Status: DC | PRN

## 2023-06-18 MED ORDER — MIDAZOLAM HCL 2 MG/2ML IJ SOLN
INTRAMUSCULAR | Status: AC
  Filled 2023-06-18: qty 2

## 2023-06-18 MED ORDER — CHLORHEXIDINE GLUCONATE 0.12 % MT SOLN: 15.0000 mL | Freq: Once | OROMUCOSAL | Status: DC

## 2023-06-18 MED ORDER — CEFAZOLIN SODIUM-DEXTROSE 2-4 GM/100ML-% IV SOLN
2.0000 g | Freq: Four times a day (QID) | INTRAVENOUS | Status: AC
  Administered 2023-06-18 – 2023-06-19 (×3): 2 g via INTRAVENOUS
  Filled 2023-06-18 (×3): qty 100

## 2023-06-18 MED ORDER — CEFAZOLIN IN SODIUM CHLORIDE 3-0.9 GM/100ML-% IV SOLN
INTRAVENOUS | Status: AC
  Filled 2023-06-18: qty 100

## 2023-06-18 MED ORDER — PHENYLEPHRINE 80 MCG/ML (10ML) SYRINGE FOR IV PUSH (FOR BLOOD PRESSURE SUPPORT)
PREFILLED_SYRINGE | INTRAVENOUS | Status: DC | PRN
  Administered 2023-06-18: 80 ug via INTRAVENOUS
  Administered 2023-06-18: 160 ug via INTRAVENOUS
  Administered 2023-06-18 (×4): 80 ug via INTRAVENOUS

## 2023-06-18 MED ORDER — CHLORHEXIDINE GLUCONATE 0.12 % MT SOLN
OROMUCOSAL | Status: AC
  Filled 2023-06-18: qty 15

## 2023-06-18 MED ORDER — PHENYLEPHRINE HCL-NACL 20-0.9 MG/250ML-% IV SOLN
INTRAVENOUS | Status: DC | PRN
  Administered 2023-06-18: 25 ug/min via INTRAVENOUS

## 2023-06-18 MED ORDER — POLYVINYL ALCOHOL 1.4 % OP SOLN: 1.0000 [drp] | OPHTHALMIC | Status: DC | PRN

## 2023-06-18 MED ORDER — FENTANYL CITRATE (PF) 250 MCG/5ML IJ SOLN
INTRAMUSCULAR | Status: AC
  Filled 2023-06-18: qty 5

## 2023-06-18 MED ORDER — OXYCODONE HCL 5 MG PO TABS
10.0000 mg | ORAL_TABLET | ORAL | Status: DC | PRN
  Administered 2023-06-19: 10 mg via ORAL
  Administered 2023-06-19: 15 mg via ORAL
  Administered 2023-06-20: 10 mg via ORAL
  Filled 2023-06-18: qty 3
  Filled 2023-06-18 (×3): qty 2

## 2023-06-18 MED ORDER — LACTATED RINGERS IV SOLN: INTRAVENOUS | Status: DC

## 2023-06-18 MED ORDER — HYDROMORPHONE HCL 1 MG/ML IJ SOLN: 1.0000 mg | INTRAMUSCULAR | Status: DC | PRN

## 2023-06-18 MED ORDER — CEFAZOLIN IN SODIUM CHLORIDE 3-0.9 GM/100ML-% IV SOLN
3.0000 g | Freq: Once | INTRAVENOUS | Status: AC
  Administered 2023-06-18: 3 g via INTRAVENOUS

## 2023-06-18 MED ORDER — ROCURONIUM BROMIDE 10 MG/ML (PF) SYRINGE
PREFILLED_SYRINGE | INTRAVENOUS | Status: DC | PRN
  Administered 2023-06-18: 60 mg via INTRAVENOUS

## 2023-06-18 SURGICAL SUPPLY — 79 items
AID PSTN UNV HD RSTRNT DISP (MISCELLANEOUS)
APL PRP STRL LF DISP 70% ISPRP (MISCELLANEOUS) ×1
BAG COUNTER SPONGE SURGICOUNT (BAG) ×1 IMPLANT
BAG SPNG CNTER NS LX DISP (BAG) ×1
BIT DRILL 3.2 (BIT) ×1
BIT DRILL 3.2XCALB NS DISP (BIT) IMPLANT
BIT DRILL CALIBRATED 2.7 (BIT) IMPLANT
BIT DRILL SHORT 2.0 ZI (BIT) IMPLANT
BIT DRL 3.2XCALB NS DISP (BIT) ×1
BIT OVERDRILL 2.7 ZI (BIT) IMPLANT
CHLORAPREP W/TINT 26 (MISCELLANEOUS) ×1 IMPLANT
COVER SURGICAL LIGHT HANDLE (MISCELLANEOUS) ×2 IMPLANT
DRAPE C-ARM 42X72 X-RAY (DRAPES) ×1 IMPLANT
DRAPE HALF SHEET 40X57 (DRAPES) ×1 IMPLANT
DRAPE IMP U-DRAPE 54X76 (DRAPES) ×1 IMPLANT
DRAPE INCISE IOBAN 66X45 STRL (DRAPES) ×1 IMPLANT
DRAPE ORTHO SPLIT 77X108 STRL (DRAPES) ×2
DRAPE SURG 17X23 STRL (DRAPES) ×1 IMPLANT
DRAPE SURG ORHT 6 SPLT 77X108 (DRAPES) ×2 IMPLANT
DRAPE U-SHAPE 47X51 STRL (DRAPES) ×1 IMPLANT
DRSG AQUACEL AG ADV 3.5X10 (GAUZE/BANDAGES/DRESSINGS) IMPLANT
DRSG EMULSION OIL 3X3 NADH (GAUZE/BANDAGES/DRESSINGS) ×1 IMPLANT
DRSG MEPILEX POST OP 4X8 (GAUZE/BANDAGES/DRESSINGS) IMPLANT
ELECT BLADE 4.0 EZ CLEAN MEGAD (MISCELLANEOUS) ×1
ELECT REM PT RETURN 9FT ADLT (ELECTROSURGICAL) ×1
ELECTRODE BLDE 4.0 EZ CLN MEGD (MISCELLANEOUS) IMPLANT
ELECTRODE REM PT RTRN 9FT ADLT (ELECTROSURGICAL) ×1 IMPLANT
GAUZE PAD ABD 8X10 STRL (GAUZE/BANDAGES/DRESSINGS) ×1 IMPLANT
GAUZE SPONGE 4X4 12PLY STRL (GAUZE/BANDAGES/DRESSINGS) IMPLANT
GLOVE BIO SURGEON STRL SZ7 (GLOVE) ×1 IMPLANT
GLOVE BIO SURGEON STRL SZ7.5 (GLOVE) ×1 IMPLANT
GLOVE BIOGEL PI IND STRL 7.0 (GLOVE) ×1 IMPLANT
GLOVE BIOGEL PI IND STRL 8 (GLOVE) ×1 IMPLANT
GOWN STRL REUS W/ TWL LRG LVL3 (GOWN DISPOSABLE) ×3 IMPLANT
GOWN STRL REUS W/ TWL XL LVL3 (GOWN DISPOSABLE) ×1 IMPLANT
GOWN STRL REUS W/TWL LRG LVL3 (GOWN DISPOSABLE) ×3
GOWN STRL REUS W/TWL XL LVL3 (GOWN DISPOSABLE) ×1
K-WIRE 2X5 SS THRDED S3 (WIRE) ×1
KIT BASIN OR (CUSTOM PROCEDURE TRAY) ×1 IMPLANT
KIT TURNOVER KIT B (KITS) ×1 IMPLANT
KWIRE 2X5 SS THRDED S3 (WIRE) IMPLANT
MANIFOLD NEPTUNE II (INSTRUMENTS) ×1 IMPLANT
NDL 22X1.5 STRL (OR ONLY) (MISCELLANEOUS) IMPLANT
NDL SUT .5 MAYO 1.404X.05X (NEEDLE) ×1 IMPLANT
NDL SUT 2 .5 CRC MAYO 1.732X (NEEDLE) ×1 IMPLANT
NEEDLE 22X1.5 STRL (OR ONLY) (MISCELLANEOUS) IMPLANT
NEEDLE MAYO TAPER (NEEDLE)
NS IRRIG 1000ML POUR BTL (IV SOLUTION) ×1 IMPLANT
PACK SHOULDER (CUSTOM PROCEDURE TRAY) ×1 IMPLANT
PAD ARMBOARD 7.5X6 YLW CONV (MISCELLANEOUS) ×2 IMPLANT
PEG LOCKING 3.2MMX44 (Peg) IMPLANT
PEG LOCKING 3.2X36 (Screw) IMPLANT
PEG LOCKING 3.2X48 (Peg) IMPLANT
PEG LOCKING 3.2X50 (Screw) IMPLANT
PLATE PROX HUM LO R 7H 133 (Plate) IMPLANT
RESTRAINT HEAD UNIVERSAL NS (MISCELLANEOUS) ×1 IMPLANT
SCREW LOCK CORT STAR 3.5X26 (Screw) IMPLANT
SCREW LOCK CORT STAR 3.5X28 (Screw) IMPLANT
SCREW LP NL T15 3.5X26 (Screw) IMPLANT
SCREW NLOCK 2.7X22 (Screw) IMPLANT
SPONGE T-LAP 18X18 ~~LOC~~+RFID (SPONGE) ×2 IMPLANT
STAPLER VISISTAT 35W (STAPLE) ×1 IMPLANT
STRIP CLOSURE SKIN 1/2X4 (GAUZE/BANDAGES/DRESSINGS) ×1 IMPLANT
SUCTION TUBE FRAZIER 10FR DISP (SUCTIONS) ×1 IMPLANT
SUPPORT WRAP ARM LG (MISCELLANEOUS) ×1 IMPLANT
SUT BONE WAX W31G (SUTURE) IMPLANT
SUT FIBERWIRE #2 38 T-5 BLUE (SUTURE)
SUT MAXBRAID (SUTURE) IMPLANT
SUT VIC AB 0 CT1 27 (SUTURE) ×2
SUT VIC AB 0 CT1 27XBRD ANBCTR (SUTURE) IMPLANT
SUT VIC AB 2-0 CT1 27 (SUTURE) ×3
SUT VIC AB 2-0 CT1 TAPERPNT 27 (SUTURE) ×2 IMPLANT
SUTURE FIBERWR #2 38 T-5 BLUE (SUTURE) ×2 IMPLANT
SYR CONTROL 10ML LL (SYRINGE) IMPLANT
TOWEL GREEN STERILE (TOWEL DISPOSABLE) ×1 IMPLANT
TOWEL GREEN STERILE FF (TOWEL DISPOSABLE) ×1 IMPLANT
TUBE CONNECTING 12X1/4 (SUCTIONS) ×1 IMPLANT
WATER STERILE IRR 1000ML POUR (IV SOLUTION) ×1 IMPLANT
YANKAUER SUCT BULB TIP NO VENT (SUCTIONS) ×1 IMPLANT

## 2023-06-18 NOTE — Op Note (Signed)
Procedure(s): OPEN REDUCTION INTERNAL FIXATION (ORIF) PROXIMAL HUMERUS FRACTURE Procedure Note  Regina Snyder female 47 y.o. 06/18/2023   Preoperative diagnosis: Right comminuted displaced proximal humerus fracture  Postoperative diagnosis: Same  Procedure(s) and Anesthesia Type:    * OPEN REDUCTION INTERNAL FIXATION (ORIF) PROXIMAL HUMERUS FRACTURE - General  Surgeons and Role:    Jones Broom, MD - Primary   Indications:  47 y.o. female s/p fall with right  comminuted proximal humerus fracture involving the humeral head and extending into the diaphysis.. Indicated for surgery to promote anatomic restoration anatomy, improve functional outcome.     Surgeon: Glennon Hamilton   Assistants: Fredia Sorrow PA-C Regina Snyder was present and scrubbed throughout the procedure and was essential in positioning, retraction, exposure, and closure)  Anesthesia: General endotracheal anesthesia with preoperative interscalene block given by the attending anesthesiologist    Procedure Detail  OPEN REDUCTION INTERNAL FIXATION (ORIF) PROXIMAL HUMERUS FRACTURE  Findings: Near-anatomic alignment with 2 distal 2.7 interfragmentary screws to fix the large diaphyseal fragment.  The humeral head fragment was elevated to near-anatomic position and the head was fixed to the shaft with a Biomet long S3 plate  Estimated Blood Loss:           Drains: none  Blood Given: none         Specimens: none        Complications:  * No complications entered in OR log *         Disposition: PACU - hemodynamically stable.         Condition: stable    Procedure:   DESCRIPTION OF PROCEDURE: The patient was identified in preoperative  holding area where I personally marked the operative site after  verifying site, side, and procedure with the patient. The patient was taken back  to the operating room where general anesthesia was induced without  complication and was kept in the supine position  with the arm on extremity table. The right upper extremity was then prepped and  draped in a standard sterile fashion. The appropriate time-out  procedure was carried out. The patient did receive IV antibiotics  within 30 minutes of incision.  An incision was made from a position over the presumed location of the coracoid down the anterior shoulder and into the anterior upper arm.  Total incision length was approximately 20 cm.  Dissection was carried through adipose tissue to the fascial layer.  The cephalic vein was identified and brought laterally with the deltoid.  The deltopectoral interval was utilized to expose the fracture.  The exposure was traced more distally to allow exposure of the anterior humerus bringing the biceps medially.  After adequate exposure was obtained the fracture fragments were cleaned of hematoma and attention was first turned to the large anterolateral fragment involving the diaphysis.  This was reduced anatomically with fracture reduction clamps and fixed anteromedial to posterolateral with 2 2.7 mm interfragmentary lag screws.  This obtained good fixation and compression.  Attention was then turned proximally to the humeral head component.  I was able to access the inner table of the humeral head fragment from inferiorly and a bone tamp was used to manually disimpact the humeral head out of valgus and reduce the step-off of the articular component of the fracture to within about a millimeter of anatomic.  At this point the shaft component of the fracture was reduced to the head component and a plate was laid in provisional position.  Fluoroscopic imaging demonstrated appropriate  positioning of the plate in AP and lateral planes.  Distally it was fixed in place with 1 screw to the shaft.  Proximally 5 none threaded smooth pegs were then placed taking care not to penetrate the articular surface.  Distally 3 additional locking screws were placed.  Final fluoroscopic imaging in AP and  lateral planes demonstrated near-anatomic alignment of the fracture with appropriate positioning of the hardware.  At this point copious irrigation was used and hemostasis was obtained.  The wound was closed in layers with 0 Vicryl 2-0 Vicryl and staples.  A sterile dressing was applied and the patient was placed in a sling.  She was allowed to awaken from anesthesia transferred to the stretcher and taken to the recovery room in stable condition.  Postoperative plan: She will be readmitted to the internal medicine service.  She will see physical therapy and Occupational Therapy and can be discharged home as soon as tomorrow if she is medically stable and able to have the help she needs at home.

## 2023-06-18 NOTE — Anesthesia Preprocedure Evaluation (Signed)
Anesthesia Evaluation  Patient identified by MRN, date of birth, ID band Patient awake    Reviewed: Allergy & Precautions, NPO status , Patient's Chart, lab work & pertinent test results  History of Anesthesia Complications Negative for: history of anesthetic complications  Airway Mallampati: III  TM Distance: >3 FB Neck ROM: Full    Dental  (+) Teeth Intact, Dental Advisory Given   Pulmonary neg pulmonary ROS   breath sounds clear to auscultation       Cardiovascular negative cardio ROS  Rhythm:Regular     Neuro/Psych negative neurological ROS  negative psych ROS   GI/Hepatic negative GI ROS, Neg liver ROS,,,  Endo/Other    Morbid obesity  Renal/GU negative Renal ROS     Musculoskeletal : Right Proximal hu,erus Fx   Abdominal   Peds  Hematology Lab Results      Component                Value               Date                      WBC                      7.1                 06/18/2023                HGB                      10.3 (L)            06/18/2023                HCT                      33.0 (L)            06/18/2023                MCV                      87.1                06/18/2023                PLT                      216                 06/18/2023              Anesthesia Other Findings   Reproductive/Obstetrics                             Anesthesia Physical Anesthesia Plan  ASA: 3  Anesthesia Plan: General and Regional   Post-op Pain Management: Regional block*   Induction: Intravenous  PONV Risk Score and Plan: 4 or greater and Ondansetron, Dexamethasone and Midazolam  Airway Management Planned: Oral ETT  Additional Equipment: None  Intra-op Plan:   Post-operative Plan: Extubation in OR  Informed Consent: I have reviewed the patients History and Physical, chart, labs and discussed the procedure including the risks, benefits and alternatives for the  proposed anesthesia with the patient or authorized representative who has indicated his/her understanding and acceptance.  Dental advisory given  Plan Discussed with: CRNA  Anesthesia Plan Comments:        Anesthesia Quick Evaluation

## 2023-06-18 NOTE — Anesthesia Procedure Notes (Signed)
Procedure Name: Intubation Date/Time: 06/18/2023 4:47 PM  Performed by: Loleta Chloeanne Poteet, CRNAPre-anesthesia Checklist: Patient identified, Patient being monitored, Timeout performed, Emergency Drugs available and Suction available Patient Re-evaluated:Patient Re-evaluated prior to induction Oxygen Delivery Method: Circle system utilized Preoxygenation: Pre-oxygenation with 100% oxygen Induction Type: IV induction Ventilation: Mask ventilation without difficulty Laryngoscope Size: Mac and 4 Grade View: Grade I Tube type: Oral Tube size: 7.5 mm Number of attempts: 1 Airway Equipment and Method: Stylet Placement Confirmation: ETT inserted through vocal cords under direct vision, positive ETCO2 and breath sounds checked- equal and bilateral Secured at: 21 cm Tube secured with: Tape Dental Injury: Teeth and Oropharynx as per pre-operative assessment

## 2023-06-18 NOTE — Discharge Instructions (Signed)
Discharge Instructions  A sling has been provided for you. Remain in your sling at all times. This includes sleeping in your sling.  No driving Use ice on the shoulder intermittently over the first 48 hours after surgery.  Pain medicine has been prescribed for you.  Use your medicine liberally over the first 48 hours, and then you can begin to taper your use. You may take Extra Strength Tylenol or Tylenol only in place of the pain pills. DO NOT take ANY nonsteroidal anti-inflammatory pain medications: Advil, Motrin, Ibuprofen, Aleve, Naproxen or Naprosyn.  You may leave the dressing on until your fallow up appt. It is waterproof. Take one aspirin 81mg  a day for 2 weeks after surgery, unless you have an aspirin sensitivity/ allergy or asthma.   Please call [phone] during normal business hours or [phone] after hours for any problems. Including the following:  - excessive redness of the incisions - drainage for more than 4 days - fever of more than 101.5 F  *Please note that pain medications will not be refilled after hours or on weekends.

## 2023-06-18 NOTE — Progress Notes (Signed)
Pt. Concern about allergy to nickel. This nurse informed PA. Hospital doctor via phone call

## 2023-06-18 NOTE — Anesthesia Procedure Notes (Signed)
Anesthesia Regional Block: Interscalene brachial plexus block   Pre-Anesthetic Checklist: , timeout performed,  Correct Patient, Correct Site, Correct Laterality,  Correct Procedure, Correct Position, site marked,  Risks and benefits discussed,  Surgical consent,  Pre-op evaluation,  At surgeon's request and post-op pain management  Laterality: Upper and Right  Prep: chloraprep       Needles:  Injection technique: Single-shot      Needle Length: 5cm  Needle Gauge: 22     Additional Needles: Arrow StimuQuik ECHO Echogenic Stimulating PNB Needle  Procedures:,,,, ultrasound used (permanent image in chart),,     Nerve Stimulator or Paresthesia:  Response: deltoid, 0.3 mA  Additional Responses:   Narrative:  Start time: 06/18/2023 4:07 PM End time: 06/18/2023 4:17 PM Injection made incrementally with aspirations every 5 mL.  Performed by: Personally  Anesthesiologist: Val Eagle, MD

## 2023-06-18 NOTE — Progress Notes (Signed)
PROGRESS NOTE  Regina Snyder ZOX:[id number] DOB: 1976/08/07   PCP: Sherlyn Hay, DO  Patient is from: Home.  DOA: 06/14/2023 LOS: 4  Chief complaints No chief complaint on file.    Brief Narrative / Interim history: 47 year old F with PMH of morbid obesity and obesity hypoventilation presented to [facility] ED with right arm pain after she had accidental fall from her porch about 1-2 steps high, and found to have severely comminuted displaced fracture in the head, neck and proximal shaft of right humerus with possible dislocation as noted on x-ray and CT.  Orthopedic surgery [facility] recommended transfer to [facility] for trauma surgery evaluation and management.  CT head without acute intracranial abnormality but left frontal scalp hematoma without skull fracture.  CT cervical spine without acute finding.  Left knee x-ray and left hand x-ray without acute finding.  Right shoulder x-ray and CT right humerus showed comminuted fracture of right proximal humerus and oblique fracture of proximal right humerus diaphysis.   Surgery postponed Monday, 7/22 due to unforeseen computer software issues   Subjective: Seen and examined earlier this morning.  No major events overnight of this morning.  No complaints other than left arm pain from IV potassium infusion.  Objective: Vitals:   06/17/23 1500 06/17/23 1934 06/18/23 0339 06/18/23 0807  BP: 136/84 (!) 155/75 120/65 137/85  Pulse: 72 82 80 88  Resp: 16 17 17 18   Temp: 98.6 F (37 C) 98.3 F (36.8 C) 98.2 F (36.8 C) 98.3 F (36.8 C)  TempSrc:      SpO2: 96% 96% 97% 95%  Weight:      Height:        Examination:  GENERAL: No apparent distress.  Nontoxic. HEENT: MMM.  Vision and hearing grossly intact.  NECK: Supple.  No apparent JVD.  RESP:  No IWOB.  Fair aeration bilaterally. CVS:  RRR. Heart sounds normal.  ABD/GI/GU: BS+. Abd soft, NTND.  MSK/EXT:  Moves extremities.  Right arm in sling.  Neurovascular intact.  Bruising over left  knee. SKIN: no apparent skin lesion or wound NEURO: Awake, alert and oriented appropriately.  No apparent focal neuro deficit. PSYCH: Calm. Normal affect.   Procedures:  None  Microbiology summarized: None  Assessment and plan: Principal Problem:   Closed fracture of right proximal humerus Active Problems:   Right humeral fracture   Obesity, Class III, BMI 40-49.9 (morbid obesity) (HCC)   Obesity hypoventilation syndrome (HCC)   Right supracondylar humerus fracture, closed, initial encounter   Hematoma of frontal scalp   Chronic acquired lymphedema   Traumatic right humerus comminuted displaced fracture due to mechanical fall -Surgery postponed to 7/22 due to software issues affecting system -Pain control -N.p.o. -IV fluid  Left frontal scalp hematoma: CT head without acute intracranial abnormality of skull fracture. -Supportive care   Obesity hypoventilation syndrome? Not on CPAP or BiPAP. -Would be cautious with opiate due to risk for respiratory depression and CO2 retention.   Morbid obesity: BMI 61.27. -Lifestyle change to lose weight -She may benefit from GLP-1 agonist or bariatric surgery outpatient.  Nausea: Likely from pain meds.  Resolved. -Antiemetics as needed  Hypokalemia: K3.2 -IV KCl 10 mill equivalents x 4 while NPO         DVT prophylaxis:  SCDs Start: 06/14/23 0536 SCDs Start: 06/14/23 0411  Code Status: Full code Family Communication: Updated patient and niece at bedside. Level of care: Med-Surg Status is: Inpatient Remains inpatient appropriate because: Right humeral fracture   Final  disposition: Likely home after surgery Consultants:  Orthopedic surgery  35 minutes with more than 50% spent in reviewing records, counseling patient/family and coordinating care.   Sch Meds:  Scheduled Meds:  Chlorhexidine Gluconate Cloth  6 each Topical Q0600   mupirocin ointment  1 Application Nasal BID   oxyCODONE  10-15 mg Oral Q6H   sodium  chloride flush  3 mL Intravenous Q12H   sodium chloride flush  3 mL Intravenous Q12H   Continuous Infusions:  sodium chloride     sodium chloride 100 mL/hr at 06/18/23 1104   methocarbamol (ROBAXIN) IV     potassium chloride 10 mEq (06/18/23 1105)   PRN Meds:.sodium chloride, acetaminophen **OR** acetaminophen, albuterol, hydrALAZINE, HYDROmorphone (DILAUDID) injection, methocarbamol **OR** methocarbamol (ROBAXIN) IV, ondansetron **OR** ondansetron (ZOFRAN) IV, prochlorperazine, senna-docusate, sodium chloride flush  Antimicrobials: Anti-infectives (From admission, onward)    Start     Dose/Rate Route Frequency Ordered Stop   06/14/23 0900  ceFAZolin (ANCEF) IVPB 3g/100 mL premix        3 g 200 mL/hr over 30 Minutes Intravenous On call to O.R. 06/14/23 0802 06/15/23 0559        I have personally reviewed the following labs and images: CBC: Recent Labs  Lab 06/14/23 0117 06/14/23 1119 06/15/23 0500 06/16/23 0201 06/18/23 0241  WBC 11.1* 7.5 6.8 8.0 7.1  NEUTROABS 9.6*  --   --   --   --   HGB 10.9* 10.5* 10.5* 9.9* 10.3*  HCT 36.2 33.6* 34.5* 32.4* 33.0*  MCV 86.2 85.1 86.0 86.2 87.1  PLT 208 196 192 196 216   BMP &GFR Recent Labs  Lab 06/14/23 0117 06/14/23 1119 06/15/23 0500 06/16/23 0201 06/18/23 0241  NA 136 134* 136 136 135  K 3.7 3.6 3.7 3.6 3.2*  CL 103 102 98 99 96*  CO2 26 26 29 29 31   GLUCOSE 136* 127* 109* 111* 108*  BUN 11 7 5* 6 7  CREATININE 0.68 0.60 0.75 0.67 0.65  CALCIUM 8.3* 8.2* 8.5* 8.3* 8.4*  MG  --   --  2.0 1.9 1.8  PHOS  --   --  2.8 3.2 3.5   Estimated Creatinine Clearance: 157.3 mL/min (by C-G formula based on SCr of 0.65 mg/dL). Liver & Pancreas: Recent Labs  Lab 06/14/23 1119 06/15/23 0500 06/16/23 0201 06/18/23 0241  AST 15  --   --  34  ALT 23  --   --  39  ALKPHOS 55  --   --  58  BILITOT 1.1  --   --  2.2*  PROT 6.1*  --   --  6.0*  ALBUMIN 2.9* 2.9* 2.7* 2.6*   No results for input(s): "LIPASE", "AMYLASE" in the  last 168 hours. No results for input(s): "AMMONIA" in the last 168 hours. Diabetic: No results for input(s): "HGBA1C" in the last 72 hours. No results for input(s): "GLUCAP" in the last 168 hours. Cardiac Enzymes: Recent Labs  Lab 06/18/23 0241  CKTOTAL 172   No results for input(s): "PROBNP" in the last 8760 hours. Coagulation Profile: Recent Labs  Lab 06/14/23 1119  INR 1.1   Thyroid Function Tests: No results for input(s): "TSH", "T4TOTAL", "FREET4", "T3FREE", "THYROIDAB" in the last 72 hours. Lipid Profile: No results for input(s): "CHOL", "HDL", "LDLCALC", "TRIG", "CHOLHDL", "LDLDIRECT" in the last 72 hours. Anemia Panel: No results for input(s): "VITAMINB12", "FOLATE", "FERRITIN", "TIBC", "IRON", "RETICCTPCT" in the last 72 hours. Urine analysis:    Component Value Date/Time   BILIRUBINUR  negative 04/29/2020 1414   PROTEINUR Negative 04/29/2020 1414   UROBILINOGEN 0.2 04/29/2020 1414   NITRITE negative 04/29/2020 1414   LEUKOCYTESUR Negative 04/29/2020 1414   Sepsis Labs: Invalid input(s): "PROCALCITONIN", "LACTICIDVEN"  Microbiology: Recent Results (from the past 240 hour(s))  Surgical pcr screen     Status: Abnormal   Collection Time: 06/15/23  5:57 AM   Specimen: Nasal Mucosa; Nasal Swab  Result Value Ref Range Status   MRSA, PCR POSITIVE (A) NEGATIVE Final    Comment: RESULT CALLED TO, READ BACK BY AND VERIFIED WITH: RN ASHLEY ON [id number] @1004  BY SM    Staphylococcus aureus POSITIVE (A) NEGATIVE Final    Comment: (NOTE) The Xpert SA Assay (FDA approved for NASAL specimens in patients 37 years of age and older), is one component of a comprehensive surveillance program. It is not intended to diagnose infection nor to guide or monitor treatment. Performed at [facility], [address] [place], [place] [address]     Radiology Studies: No results found.    Natacia Chaisson T. Evans [facility]  If 7PM-7AM, please contact  night-coverage [email or web address] 06/18/2023, 12:08 PM

## 2023-06-18 NOTE — Progress Notes (Signed)
PATIENT ID: Regina Snyder  MRN: [id number]  DOB/AGE:  1975-12-16 / 47 y.o.    Procedure(s) (LRB): OPEN REDUCTION INTERNAL FIXATION (ORIF) PROXIMAL HUMERUS FRACTURE (Right)  Subjective: Patient reports pain and "heaviness" in the right shoulder. Denies numbness and tingling. Eager to get shoulder fixed.   Objective: Vital signs in last 24 hours: Temp:  [98.2 F (36.8 C)-98.6 F (37 C)] 98.3 F (36.8 C) (07/22 0807) Pulse Rate:  [72-88] 88 (07/22 0807) Resp:  [16-18] 18 (07/22 0807) BP: (120-155)/(65-85) 137/85 (07/22 0807) SpO2:  [95 %-97 %] 95 % (07/22 0807)  Intake/Output from previous day: 07/21 0701 - 07/22 0700 In: 480 [P.O.:480] Out: -    Recent Labs    06/16/23 0201 06/18/23 0241  HGB 9.9* 10.3*   Recent Labs    06/16/23 0201 06/18/23 0241  WBC 8.0 7.1  RBC 3.76* 3.79*  HCT 32.4* 33.0*  PLT 196 216   Recent Labs    06/16/23 0201 06/18/23 0241  NA 136 135  K 3.6 3.2*  CL 99 96*  CO2 29 31  BUN 6 7  CREATININE 0.67 0.65  GLUCOSE 111* 108*  CALCIUM 8.3* 8.4*     Physical Exam: Neurologically intact Sensation intact distally Intact pulses distally Swelling and ecchymosis about RUE  Assessment/Plan:   Procedure(s) (LRB): OPEN REDUCTION INTERNAL FIXATION (ORIF) PROXIMAL HUMERUS FRACTURE (Right)   Plan for OR this afternoon with Dr Ave Filter for ORIF right proximal humerus fracture. Patient to remain NPO. Continue sling RUE. NWB RUE.    Prabhav Faulkenberry L. [place], PA-C 06/18/2023, 9:04 AM

## 2023-06-18 NOTE — Anesthesia Postprocedure Evaluation (Signed)
Anesthesia Post Note  Patient: Regina Snyder  Procedure(s) Performed: OPEN REDUCTION INTERNAL FIXATION (ORIF) PROXIMAL HUMERUS FRACTURE (Right: Arm Upper)     Patient location during evaluation: PACU Anesthesia Type: Regional and General Level of consciousness: sedated Pain management: pain level controlled Vital Signs Assessment: post-procedure vital signs reviewed and stable Respiratory status: spontaneous breathing and respiratory function stable Cardiovascular status: stable Postop Assessment: no apparent nausea or vomiting Anesthetic complications: no   No notable events documented.  Last Vitals:  Vitals:   06/18/23 1945 06/18/23 2000  BP: (!) 119/52 105/76  Pulse: 96 97  Resp: 16 18  Temp:  36.8 C  SpO2: 94% 94%    Last Pain:  Vitals:   06/18/23 1945  TempSrc:   PainSc: 0-No pain                 Brian Kocourek DANIEL

## 2023-06-18 NOTE — Transfer of Care (Signed)
Immediate Anesthesia Transfer of Care Note  Patient: ELLINGTON CORNIA  Procedure(s) Performed: OPEN REDUCTION INTERNAL FIXATION (ORIF) PROXIMAL HUMERUS FRACTURE (Right: Arm Upper)  Patient Location: PACU  Anesthesia Type:General and Regional  Level of Consciousness: awake and alert   Airway & Oxygen Therapy: Patient Spontanous Breathing and Patient connected to face mask oxygen  Post-op Assessment: Report given to RN and Post -op Vital signs reviewed and stable  Post vital signs: Reviewed and stable  Last Vitals:  Vitals Value Taken Time  BP 128/62 06/18/23 1922  Temp 36.8 C 06/18/23 1922  Pulse 109 06/18/23 1927  Resp 18 06/18/23 1927  SpO2 93 % 06/18/23 1927  Vitals shown include unfiled device data.  Last Pain:  Vitals:   06/18/23 1532  TempSrc:   PainSc: 3       Patients Stated Pain Goal: 3 (06/17/23 1410)  Complications: No notable events documented.

## 2023-06-18 NOTE — Progress Notes (Signed)
Iv potassium stopped due to loss of IV

## 2023-06-18 NOTE — Interval H&P Note (Signed)
History and Physical Interval Note:  06/18/2023 3:55 PM  Regina Snyder  has presented today for surgery, with the diagnosis of Right Proximal hu,erus Fx.  The various methods of treatment have been discussed with the patient and family. After consideration of risks, benefits and other options for treatment, the patient has consented to  Procedure(s): OPEN REDUCTION INTERNAL FIXATION (ORIF) PROXIMAL HUMERUS FRACTURE (Right) as a surgical intervention.  The patient's history has been reviewed, patient examined, no change in status, stable for surgery.  I have reviewed the patient's chart and labs.  Questions were answered to the patient's satisfaction.     Glennon Hamilton

## 2023-06-19 DIAGNOSIS — S42291A Other displaced fracture of upper end of right humerus, initial encounter for closed fracture: Secondary | ICD-10-CM | POA: Diagnosis not present

## 2023-06-19 DIAGNOSIS — S0003XA Contusion of scalp, initial encounter: Secondary | ICD-10-CM | POA: Diagnosis not present

## 2023-06-19 DIAGNOSIS — S42421A Displaced comminuted supracondylar fracture without intercondylar fracture of right humerus, initial encounter for closed fracture: Secondary | ICD-10-CM | POA: Diagnosis not present

## 2023-06-19 LAB — IRON AND TIBC
Iron: 37 ug/dL (ref 28–170)
Saturation Ratios: 10 % — ABNORMAL LOW (ref 10.4–31.8)
TIBC: 358 ug/dL (ref 250–450)
UIBC: 321 ug/dL

## 2023-06-19 LAB — RENAL FUNCTION PANEL
Albumin: 2.8 g/dL — ABNORMAL LOW (ref 3.5–5.0)
Anion gap: 12 (ref 5–15)
BUN: 9 mg/dL (ref 6–20)
CO2: 28 mmol/L (ref 22–32)
Calcium: 8.8 mg/dL — ABNORMAL LOW (ref 8.9–10.3)
Chloride: 97 mmol/L — ABNORMAL LOW (ref 98–111)
Creatinine, Ser: 0.65 mg/dL (ref 0.44–1.00)
GFR, Estimated: >60 mL/min (ref >60)
Glucose, Bld: 135 mg/dL — ABNORMAL HIGH (ref 70–99)
Phosphorus: 3.1 mg/dL (ref 2.5–4.6)
Potassium: 3.9 mmol/L (ref 3.5–5.1)
Sodium: 137 mmol/L (ref 135–145)

## 2023-06-19 LAB — RETICULOCYTES
Immature Retic Fract: 28 % — ABNORMAL HIGH (ref 2.3–15.9)
RBC.: 4.08 MIL/uL (ref 3.87–5.11)
Retic Count, Absolute: 131.8 10*3/uL (ref 19.0–186.0)
Retic Ct Pct: 3.2 % — ABNORMAL HIGH (ref 0.4–3.1)

## 2023-06-19 LAB — CBC
HCT: 31 % — ABNORMAL LOW (ref 36.0–46.0)
Hemoglobin: 9.8 g/dL — ABNORMAL LOW (ref 12.0–15.0)
MCH: 27.2 pg (ref 26.0–34.0)
MCHC: 31.6 g/dL (ref 30.0–36.0)
MCV: 86.1 fL (ref 80.0–100.0)
Platelets: 231 10*3/uL (ref 150–400)
RBC: 3.6 MIL/uL — ABNORMAL LOW (ref 3.87–5.11)
RDW: 15.4 % (ref 11.5–15.5)
WBC: 9.2 10*3/uL (ref 4.0–10.5)
nRBC: 0 % (ref 0.0–0.2)

## 2023-06-19 LAB — FOLATE: Folate: 6 ng/mL (ref >5.9)

## 2023-06-19 LAB — MAGNESIUM: Magnesium: 2 mg/dL (ref 1.7–2.4)

## 2023-06-19 LAB — VITAMIN B12: Vitamin B-12: 361 pg/mL (ref 180–914)

## 2023-06-19 LAB — FERRITIN: Ferritin: 75 ng/mL (ref 11–307)

## 2023-06-19 MED ORDER — SENNOSIDES-DOCUSATE SODIUM 8.6-50 MG PO TABS: 1.0000 | ORAL_TABLET | Freq: Two times a day (BID) | ORAL | Status: DC | PRN

## 2023-06-19 MED ORDER — ACETAMINOPHEN 325 MG PO TABS: 650.0000 mg | ORAL_TABLET | Freq: Four times a day (QID) | ORAL | Status: AC | PRN

## 2023-06-19 NOTE — Evaluation (Addendum)
Occupational Therapy Evaluation Patient Details Name: Regina Snyder MRN: [id number] DOB: Nov 01, 1976 Today's Date: 06/19/2023   History of Present Illness Pt is 47 yo female who presents on 06/13/23 after falling down stairs and sustained hematoma R forehead and R proximal humerus fx. Underwent ORIF on 06/18/23. PMH: morbid obesity and obesity hypoventilation syndrome   Clinical Impression   PTA patient independent and driving. Admitted for above and presents with problem list below.  She was educated on sling mgmt and wear schedule, precautions (weightbearing and ROM restrictions), ADL compensatory techniques, DME and recommendations.  She requires increased time for all activities and is limited by pain, decreased functional use of dominant R UE, and decreased activity tolerance. She requires min guard for transfers and mobility, setup to mod assist for ADLs.  She will need further sling mgmt, exercises and ADLs prior to dc home. She will have good support at home, but believe she will benefit from further OT service acutely and after dc at [facility] level to optimize independence, safety and return to PLOF.      Recommendations for follow up therapy are one component of a multi-disciplinary discharge planning process, led by the attending physician.  Recommendations may be updated based on patient status, additional functional criteria and insurance authorization.   Assistance Recommended at Discharge Frequent or constant Supervision/Assistance  Patient can return home with the following A little help with walking and/or transfers;A lot of help with bathing/dressing/bathroom;Assistance with cooking/housework;Assist for transportation;Help with stairs or ramp for entrance    Functional Status Assessment  Patient has had a recent decline in their functional status and demonstrates the ability to make significant improvements in function in a reasonable and predictable amount of time.  Equipment  Recommendations  None recommended by OT    Recommendations for Other Services       Precautions / Restrictions Precautions Precautions: Fall;Shoulder Type of Shoulder Precautions: no shoulder ROM Shoulder Interventions: Shoulder sling/immobilizer;At all times Precaution Comments: okay for elbow, wrist and hand Required Braces or Orthoses: Sling Restrictions Weight Bearing Restrictions: Yes RUE Weight Bearing: Non weight bearing Other Position/Activity Restrictions: okay for elbow, wrist and hand ROM      Mobility Bed Mobility               General bed mobility comments: EOB upon entry    Transfers Overall transfer level: Needs assistance Equipment used: None Transfers: Sit to/from Stand Sit to Stand: Min guard           General transfer comment: for safety      Balance Overall balance assessment: Mild deficits observed, not formally tested                                         ADL either performed or assessed with clinical judgement   ADL Overall ADL's : Needs assistance/impaired     Grooming: Minimal assistance;Sitting           Upper Body Dressing : Moderate assistance;Sitting   Lower Body Dressing: Moderate assistance;Sit to/from stand   Toilet Transfer: Min guard;Ambulation   Toileting- Clothing Manipulation and Hygiene: Minimal assistance;Sit to/from stand       Functional mobility during ADLs: Min guard;Cueing for safety       Vision Baseline Vision/History:  (wear contacts) Ability to See in Adequate Light: 0 Adequate Patient Visual Report: No change from baseline  Perception     Praxis      Pertinent Vitals/Pain       Hand Dominance Right   Extremity/Trunk Assessment Upper Extremity Assessment Upper Extremity Assessment: RUE deficits/detail RUE Deficits / Details: limited by shoulder immobilzation post surgery, nerve block intact and in sling.  able to wiggle fingers RUE: Unable to fully  assess due to immobilization RUE Sensation: decreased light touch RUE Coordination: decreased gross motor   Lower Extremity Assessment Lower Extremity Assessment: Defer to PT evaluation       Communication Communication Communication: No difficulties   Cognition                                       General Comments: pt reports feeling "different" but unable to explain why. She is verbose, reports feeling down about the situation and wondering if she will be able to go back to work.  She does report "blacking out" for a few seconds during accident and has headache, question concussion.     General Comments  family at side and supportive, educated on precautions and recommendations.    Exercises     Shoulder Instructions      Home Living Family/patient expects to be discharged to:: Private residence Living Arrangements: Alone Available Help at Discharge: Family;Friend(s);Available PRN/intermittently Type of Home: House Home Access: Stairs to enter [employer] of Steps: 2 Entrance Stairs-Rails: Left Home Layout: One level     Bathroom Shower/Tub: Chief Strategy Officer: Standard     Home Equipment: None   Additional Comments: pt's daughter, niece, and friend to help her when she gets home. 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        OT Problem List: Decreased strength;Decreased range of motion;Decreased activity tolerance;Impaired balance (sitting and/or standing);Decreased coordination;Decreased knowledge of use of DME or AE;Decreased knowledge of precautions;Pain;Impaired UE functional use;Increased edema;Impaired sensation;Obesity      OT Treatment/Interventions: Self-care/ADL training;Therapeutic exercise;DME and/or AE  instruction;Therapeutic activities;Patient/family education;Balance training    OT Goals(Current goals can be found in the care plan section) Acute Rehab OT Goals Patient Stated Goal: home OT Goal Formulation: With patient Time For Goal Achievement: 07/03/23 Potential to Achieve Goals: Good  OT Frequency: Min 1X/week    Co-evaluation              AM-PAC OT "6 Clicks" Daily Activity     Outcome Measure Help from another person eating meals?: A Little Help from another person taking care of personal grooming?: A Little Help from another person toileting, which includes using toliet, bedpan, or urinal?: A Little Help from another person bathing (including washing, rinsing, drying)?: A Little Help from another person to put on and taking off regular upper body clothing?: A Lot Help from another person to put on and taking off regular lower body clothing?: A Lot 6 Click Score: 16   End of Session Equipment Utilized During Treatment: Other (comment) (sling) Nurse Communication: Mobility status  Activity Tolerance: Patient tolerated treatment well Patient left: in chair;with call bell/phone within reach;with family/visitor present  OT Visit Diagnosis: Other abnormalities of gait and mobility (R26.89);Pain Pain - Right/Left: Left Pain - part of body: Shoulder  Time: 1610-9604 OT Time Calculation (min): 57 min Charges:  OT General Charges $OT Visit: 1 Visit OT Evaluation $OT Eval Moderate Complexity: 1 Mod OT Treatments $Self Care/Home Management : 38-52 mins  Barry Brunner, OT Acute Rehabilitation Services Office [phone]   Regina Snyder 06/19/2023, 11:15 AM

## 2023-06-19 NOTE — Evaluation (Signed)
Physical Therapy Evaluation Patient Details Name: Regina Snyder MRN: [id number] DOB: [birth year] Today's Date: 06/19/2023  History of Present Illness  Pt is 47 yo female who presents on 06/13/23 after falling down stairs and sustained hematoma R forehead and R proximal humerus fx. Underwent ORIF on 06/18/23. PMH: morbid obesity and obesity hypoventilation syndrome  Clinical Impression  Pt admitted with above diagnosis. 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 At baseline she has joint issues B knees and now with acute bruising B knees. Mild balance deficits noted without the use of the RUE during gait. Educated on use of SPC for increased stability in the short term while in sling. Pt has family support once she goes home. Recommend HHPT in addition to this. Also question whether she may have a mild concussion, had some difficulties staying attentive to tasks on eval.  Pt currently with functional limitations due to the deficits listed below (see PT Problem List). Pt will benefit from acute skilled PT to increase their independence and safety with mobility to allow discharge.           Assistance Recommended at Discharge Frequent or constant Supervision/Assistance  If plan is discharge home, recommend the following:  Can travel by private vehicle  A little help with walking and/or transfers;A lot of help with bathing/dressing/bathroom;Assistance with cooking/housework;Assist for transportation        Equipment Recommendations None recommended by PT (pt has cane available)  Recommendations for Other Services       Functional Status Assessment Patient has had a recent decline in their functional status and demonstrates the ability to make significant improvements in function in a reasonable and predictable amount of time.     Precautions / Restrictions Precautions Precautions: Fall;Shoulder Type of Shoulder Precautions: no  shoulder ROM Shoulder Interventions: Shoulder sling/immobilizer;At all times Precaution Comments: okay for elbow, wrist and hand Required Braces or Orthoses: Sling Restrictions Weight Bearing Restrictions: Yes RUE Weight Bearing: Non weight bearing Other Position/Activity Restrictions: okay for elbow, wrist and hand ROM      Mobility  Bed Mobility               General bed mobility comments: EOB upon entry    Transfers Overall transfer level: Needs assistance Equipment used: None Transfers: Sit to/from Stand Sit to Stand: Min guard           General transfer comment: for safety, esp from low recliner, pt uses momentum to rise    Ambulation/Gait Ambulation/Gait assistance: Min guard Gait Distance (Feet): 150 Feet Assistive device: Straight cane Gait Pattern/deviations: Step-through pattern, Decreased stride length Gait velocity: decreased Gait velocity interpretation: 1.31 - 2.62 ft/sec, indicative of limited community ambulator   General Gait Details: pt with cautious gait, no overt LOB. Educated on use of SPC for increased stability, pt with effective use with practice  Stairs            Wheelchair Mobility     Tilt Bed    Modified Rankin (Stroke Patients Only)       Balance Overall balance assessment: Mild deficits observed, not formally tested                                           Pertinent Vitals/Pain Pain Assessment Pain Assessment: Faces Faces Pain Scale: Hurts  a little bit Breathing: normal Negative Vocalization: none Facial Expression: smiling or inexpressive Body Language: relaxed Consolability: no need to console PAINAD Score: 0 Pain Location: R shoulder Pain Descriptors / Indicators: Sore Pain Intervention(s): Limited activity within patient's tolerance, Monitored during session    Home Living Family/patient expects to be discharged to:: Private residence Living Arrangements: Alone Available Help at  Discharge: Family;Friend(s);Available PRN/intermittently Type of Home: House Home Access: Stairs to enter Entrance Stairs-Rails: Left Entrance Stairs-Number of Steps: 2   Home Layout: One level Home Equipment: None Additional Comments: pt's daughter, niece, and friend to help her when she gets home. 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  able to wiggle fingers RUE: Unable to fully assess due to immobilization RUE Sensation: decreased light touch RUE Coordination: decreased gross motor    Lower Extremity Assessment Lower Extremity Assessment: RLE deficits/detail;LLE deficits/detail;Generalized weakness RLE Deficits / Details: Pt with tendinitis R knee and now bruising from fall, tightness with knee flexion. pt has difficulty extending knees from flexed position >90 deg such as low chair RLE Sensation: WNL RLE Coordination: WNL LLE Deficits / Details: pt with bursitis L knee and now with bruising to knee. Trophic distal LE bilaterally, swelling not significant today but she reports BLE swelling PTA. Stiffness noted with knee flexion and difficulty extending from a flexed position >90 deg LLE Sensation: WNL LLE Coordination: WNL    Cervical / Trunk Assessment Cervical / Trunk Assessment: Other exceptions Cervical / Trunk Exceptions: increased body habitus  Communication   Communication: No difficulties  Cognition Arousal/Alertness: Awake/alert Behavior During Therapy: Anxious Overall Cognitive Status: Within Functional Limits for tasks assessed                                  General Comments: pt reports feeling "different" but unable to explain why. She is verbose, reports feeling down about the situation and wondering if she will be able to go back to work.  She does report "blacking out" for a few seconds during accident and has headache, question concussion.        General Comments General comments (skin integrity, edema, etc.): family supportive. SPO2 low 90's. HR 108 bpm    Exercises     Assessment/Plan    PT Assessment Patient needs continued PT services  PT Problem List Decreased strength;Decreased activity tolerance;Decreased balance;Decreased mobility;Decreased knowledge of use of DME;Pain       PT Treatment Interventions DME instruction;Gait training;Stair training;Functional mobility training;Therapeutic activities;Therapeutic exercise;Balance training;Neuromuscular re-education;Cognitive remediation;Patient/family education    PT Goals (Current goals can be found in the Care Plan section)  Acute Rehab PT Goals Patient Stated Goal: return home PT Goal Formulation: With patient Time For Goal Achievement: 07/03/23 Potential to Achieve Goals: Good    Frequency Min 1X/week     Co-evaluation               AM-PAC PT "6 Clicks" Mobility  Outcome Measure Help needed turning from your back to  your side while in a flat bed without using bedrails?: A Lot Help needed moving from lying on your back to sitting on the side of a flat bed without using bedrails?: A Lot Help needed moving to and from a bed to a chair (including a wheelchair)?: A Little Help needed standing up from a chair using your arms (e.g., wheelchair or bedside chair)?: A Little Help needed to walk in hospital room?: A Little Help needed climbing 3-5 steps with a railing? : A Lot 6 Click Score: 15    End of Session   Activity Tolerance: Patient tolerated treatment well Patient left: in chair;with call bell/phone within  reach;with family/visitor present Nurse Communication: Mobility status PT Visit Diagnosis: Unsteadiness on feet (R26.81);History of falling (Z91.81);Pain Pain - Right/Left:  (bilateral) Pain - part of body: Knee;Shoulder    Time: 0913-0930 PT Time Calculation (min) (ACUTE ONLY): 17 min   Charges:   PT Evaluation $PT Eval Moderate Complexity: 1 Mod   PT General Charges $$ ACUTE PT VISIT: 1 Visit         Lyanne Co, PT  Acute Rehab Services Secure chat preferred Office [phone]   Lawana Chambers Maurianna Benard 06/19/2023, 12:54 PM

## 2023-06-19 NOTE — Progress Notes (Signed)
PROGRESS NOTE  Regina Snyder ZDG:[id number] DOB: Jul 17, 1976   PCP: Sherlyn Hay, DO  Patient is from: Home.  DOA: 06/14/2023 LOS: 5  Chief complaints No chief complaint on file.    Brief Narrative / Interim history: 47 year old F with PMH of morbid obesity and obesity hypoventilation presented to [facility] ED with right arm pain after she had accidental fall from her porch about 1-2 steps high, and found to have severely comminuted displaced fracture in the head, neck and proximal shaft of right humerus with possible dislocation as noted on x-ray and CT.  Orthopedic surgery [facility] recommended transfer to [facility] for trauma surgery evaluation and management.  CT head without acute intracranial abnormality but left frontal scalp hematoma without skull fracture.  CT cervical spine without acute finding.  Left knee x-ray and left hand x-ray without acute finding.  Right shoulder x-ray and CT right humerus showed comminuted fracture of right proximal humerus and oblique fracture of proximal right humerus diaphysis.   Patient underwent surgical repair on 7/22.  Likely discharge home on 7/24 with home health PT/OT.   Subjective: Seen and examined earlier this morning.  No major events overnight of this morning.  Complaining of numbness in surgical arm but able to move her fingers.  Pain fairly controlled.  Objective: Vitals:   06/18/23 2354 06/19/23 0009 06/19/23 0359 06/19/23 0721  BP: 117/63  119/69 130/65  Pulse: (!) 105  (!) 109 96  Resp: 17  16   Temp: 98.4 F (36.9 C)  98.4 F (36.9 C) 98.1 F (36.7 C)  TempSrc:   Oral Oral  SpO2: 91% 93% 94% 96%  Weight:      Height:        Examination:  GENERAL: No apparent distress.  Nontoxic. HEENT: MMM.  Vision and hearing grossly intact.  NECK: Supple.  No apparent JVD.  RESP:  No IWOB.  Fair aeration bilaterally. CVS:  RRR. Heart sounds normal.  ABD/GI/GU: BS+. Abd soft, NTND.  MSK/EXT:  Moves extremities.  Right arm in sling.   Dressing DCI.  Some bruising posterolaterally. SKIN: As above. NEURO: Awake, alert and oriented appropriately.  No apparent focal neuro deficit. PSYCH: Calm. Normal affect.   Procedures:  7/22-ORIF of proximal humeral fracture by Dr. Ave Filter.  Microbiology summarized: None  Assessment and plan: Principal Problem:   Closed fracture of right proximal humerus Active Problems:   Right humeral fracture   Obesity, Class III, BMI 40-49.9 (morbid obesity) (HCC)   Obesity hypoventilation syndrome (HCC)   Right supracondylar humerus fracture, closed, initial encounter   Hematoma of frontal scalp   Chronic acquired lymphedema   Traumatic right humerus comminuted displaced fracture due to mechanical fall -S/p ORIF of proximal humeral fracture by Dr. Alethia Berthold on 7/22 -Pain control and VTE prophylaxis per surgery -Discontinue IV fluid -PT/OT  Left frontal scalp hematoma: CT head without acute intracranial abnormality of skull fracture. -Supportive care   Obesity hypoventilation syndrome? Not on CPAP or BiPAP. -Would be cautious with opiate due to risk for respiratory depression and CO2 retention.   Morbid obesity: BMI 61.27. -Lifestyle change to lose weight -She may benefit from GLP-1 agonist or bariatric surgery outpatient.  Nausea: Likely from pain meds.  Resolved. -Antiemetics as needed  Hypokalemia: Resolved.  Normocytic anemia: Stable. Recent Labs    06/14/23 0117 06/14/23 1119 06/15/23 0500 06/16/23 0201 06/18/23 0241 06/19/23 0040  HGB 10.9* 10.5* 10.5* 9.9* 10.3* 9.8*  -Check anemia panel  DVT prophylaxis:  SCDs Start: 06/18/23 2022 SCDs Start: 06/14/23 0536 SCDs Start: 06/14/23 0411  Code Status: Full code Family Communication: None at bedside. Level of care: Med-Surg Status is: Inpatient Remains inpatient appropriate because: Right humeral fracture   Final disposition: Likely home on 7/24. Consultants:  Orthopedic surgery  35 minutes  with more than 50% spent in reviewing records, counseling patient/family and coordinating care.   Sch Meds:  Scheduled Meds:  docusate sodium  100 mg Oral BID   mupirocin ointment  1 Application Nasal BID   sodium chloride flush  3 mL Intravenous Q12H   sodium chloride flush  3 mL Intravenous Q12H   Continuous Infusions:  sodium chloride     methocarbamol (ROBAXIN) IV     PRN Meds:.sodium chloride, acetaminophen **OR** acetaminophen, albuterol, hydrALAZINE, HYDROmorphone (DILAUDID) injection, methocarbamol **OR** methocarbamol (ROBAXIN) IV, ondansetron **OR** ondansetron (ZOFRAN) IV, oxyCODONE, polyvinyl alcohol, prochlorperazine, senna-docusate, sodium chloride flush  Antimicrobials: Anti-infectives (From admission, onward)    Start     Dose/Rate Route Frequency Ordered Stop   06/19/23 0000  ceFAZolin (ANCEF) IVPB 2g/100 mL premix        2 g 200 mL/hr over 30 Minutes Intravenous Every 6 hours 06/18/23 2021 06/19/23 1201   06/18/23 1615  ceFAZolin (ANCEF) IVPB 3g/100 mL premix        3 g 200 mL/hr over 30 Minutes Intravenous  Once 06/18/23 1612 06/18/23 1654   06/18/23 1556  ceFAZolin (ANCEF) 3-0.9 GM/100ML-% IVPB       Note to Pharmacy: Shanda Bumps M: cabinet override      06/18/23 1556 06/18/23 1649   06/14/23 0900  ceFAZolin (ANCEF) IVPB 3g/100 mL premix        3 g 200 mL/hr over 30 Minutes Intravenous On call to O.R. 06/14/23 0802 06/15/23 0559        I have personally reviewed the following labs and images: CBC: Recent Labs  Lab 06/14/23 0117 06/14/23 1119 06/15/23 0500 06/16/23 0201 06/18/23 0241 06/19/23 0040  WBC 11.1* 7.5 6.8 8.0 7.1 9.2  NEUTROABS 9.6*  --   --   --   --   --   HGB 10.9* 10.5* 10.5* 9.9* 10.3* 9.8*  HCT 36.2 33.6* 34.5* 32.4* 33.0* 31.0*  MCV 86.2 85.1 86.0 86.2 87.1 86.1  PLT 208 196 192 196 216 231   BMP &GFR Recent Labs  Lab 06/14/23 1119 06/15/23 0500 06/16/23 0201 06/18/23 0241 06/19/23 0918  NA 134* 136 136 135 137   K 3.6 3.7 3.6 3.2* 3.9  CL 102 98 99 96* 97*  CO2 26 29 29 31 28   GLUCOSE 127* 109* 111* 108* 135*  BUN 7 5* 6 7 9   CREATININE 0.60 0.75 0.67 0.65 0.65  CALCIUM 8.2* 8.5* 8.3* 8.4* 8.8*  MG  --  2.0 1.9 1.8 2.0  PHOS  --  2.8 3.2 3.5 3.1   Estimated Creatinine Clearance: 157.3 mL/min (by C-G formula based on SCr of 0.65 mg/dL). Liver & Pancreas: Recent Labs  Lab 06/14/23 1119 06/15/23 0500 06/16/23 0201 06/18/23 0241 06/19/23 0918  AST 15  --   --  34  --   ALT 23  --   --  39  --   ALKPHOS 55  --   --  58  --   BILITOT 1.1  --   --  2.2*  --   PROT 6.1*  --   --  6.0*  --   ALBUMIN 2.9* 2.9* 2.7* 2.6* 2.8*  No results for input(s): "LIPASE", "AMYLASE" in the last 168 hours. No results for input(s): "AMMONIA" in the last 168 hours. Diabetic: No results for input(s): "HGBA1C" in the last 72 hours. No results for input(s): "GLUCAP" in the last 168 hours. Cardiac Enzymes: Recent Labs  Lab 06/18/23 0241  CKTOTAL 172   No results for input(s): "PROBNP" in the last 8760 hours. Coagulation Profile: Recent Labs  Lab 06/14/23 1119  INR 1.1   Thyroid Function Tests: No results for input(s): "TSH", "T4TOTAL", "FREET4", "T3FREE", "THYROIDAB" in the last 72 hours. Lipid Profile: No results for input(s): "CHOL", "HDL", "LDLCALC", "TRIG", "CHOLHDL", "LDLDIRECT" in the last 72 hours. Anemia Panel: No results for input(s): "VITAMINB12", "FOLATE", "FERRITIN", "TIBC", "IRON", "RETICCTPCT" in the last 72 hours. Urine analysis:    Component Value Date/Time   BILIRUBINUR negative 04/29/2020 1414   PROTEINUR Negative 04/29/2020 1414   UROBILINOGEN 0.2 04/29/2020 1414   NITRITE negative 04/29/2020 1414   LEUKOCYTESUR Negative 04/29/2020 1414   Sepsis Labs: Invalid input(s): "PROCALCITONIN", "LACTICIDVEN"  Microbiology: Recent Results (from the past 240 hour(s))  Surgical pcr screen     Status: Abnormal   Collection Time: 06/15/23  5:57 AM   Specimen: Nasal Mucosa; Nasal  Swab  Result Value Ref Range Status   MRSA, PCR POSITIVE (A) NEGATIVE Final    Comment: RESULT CALLED TO, READ BACK BY AND VERIFIED WITH: RN ASHLEY ON [id number] @[phone]  BY SM    Staphylococcus aureus POSITIVE (A) NEGATIVE Final    Comment: (NOTE) The Xpert SA Assay (FDA approved for NASAL specimens in patients 52 years of age and older), is one component of a comprehensive surveillance program. It is not intended to diagnose infection nor to guide or monitor treatment. Performed at [facility], [address], [place], [place] [address]     Radiology Studies: DG Humerus Right  Result Date: 06/18/2023 CLINICAL DATA:  Known proximal right humeral fracture EXAM: RIGHT HUMERUS - 2+ VIEW COMPARISON:  06/13/2023 FLUOROSCOPY TIME:  Radiation Exposure Index (as provided by the fluoroscopic device): 5.05 mGy If the device does not provide the exposure index: Fluoroscopy Time:  35 seconds Number of Acquired Images:  8 FINDINGS: Initial images again demonstrate the comminuted proximal right humeral fracture. Fracture fragments within reduced and multiple fixation screws placed. Additionally fixation sideplate with multiple fixation screws was placed. Fracture fragments are in near anatomic alignment. IMPRESSION: ORIF proximal right humeral fracture. Electronically Signed   By: Alcide Clever M.D.   On: 06/18/2023 22:45   DG C-Arm 1-60 Min-No Report  Result Date: 06/18/2023 Fluoroscopy was utilized by the requesting physician.  No radiographic interpretation.   DG C-Arm 1-60 Min-No Report  Result Date: 06/18/2023 Fluoroscopy was utilized by the requesting physician.  No radiographic interpretation.      Laquandra Carrillo T. Scotty Weigelt [facility]  If 7PM-7AM, please contact night-coverage [email or web address] 06/19/2023, 1:26 PM

## 2023-06-19 NOTE — Progress Notes (Signed)
PATIENT ID: Regina Snyder  MRN: [id number]  DOB/AGE:  [birth year] / 47 y.o.  1 Day Post-Op Procedure(s) (LRB): OPEN REDUCTION INTERNAL FIXATION (ORIF) PROXIMAL HUMERUS FRACTURE (Right)  Subjective: Patient reports no pain in the right shoulder due to nerve block but reports concerns about her arm still being numb. She voices fears about being discharged home today   Objective: Vital signs in last 24 hours: Temp:  [98.1 F (36.7 C)-98.4 F (36.9 C)] 98.1 F (36.7 C) (07/23 0721) Pulse Rate:  [96-109] 96 (07/23 0721) Resp:  [16-20] 16 (07/23 0359) BP: (105-130)/(52-78) 130/65 (07/23 0721) SpO2:  [91 %-98 %] 96 % (07/23 0721)  Intake/Output from previous day: 07/22 0701 - 07/23 0700 In: 1440 [P.O.:240; I.V.:1000; IV Piggyback:200] Out: 400 [Blood:400] Intake/Output this shift: No intake/output data recorded.  Recent Labs    06/18/23 0241 06/19/23 0040  HGB 10.3* 9.8*   Recent Labs    06/18/23 0241 06/19/23 0040  WBC 7.1 9.2  RBC 3.79* 3.60*  HCT 33.0* 31.0*  PLT 216 231   Recent Labs    06/18/23 0241  NA 135  K 3.2*  CL 96*  CO2 31  BUN 7  CREATININE 0.65  GLUCOSE 108*  CALCIUM 8.4*    Physical Exam: Intact pulses distally Incision: dressing C/D/I No cellulitis present Compartment soft Patient has good motor function of right hand and fingers but sensation to light touch still limited from nerve block RUE in sling  Assessment/Plan: 1 Day Post-Op Procedure(s) (LRB): OPEN REDUCTION INTERNAL FIXATION (ORIF) PROXIMAL HUMERUS FRACTURE (Right)   Advance diet Non Weight Bearing (NWB) RUE VTE prophylaxis:  aspirin 81mg    Reassured patient that it is expected for her to still have numbness in the RUE due to the nerve block. Patient will work with therapy today. Continue sling. Hopeful for DC home tomorrow if pain is well managed with PO    Regina Snyder L. [place], PA-C 06/19/2023, 10:17 AM

## 2023-06-20 ENCOUNTER — Other Ambulatory Visit: Payer: Self-pay

## 2023-06-20 DIAGNOSIS — S0003XA Contusion of scalp, initial encounter: Secondary | ICD-10-CM | POA: Diagnosis not present

## 2023-06-20 DIAGNOSIS — S42291A Other displaced fracture of upper end of right humerus, initial encounter for closed fracture: Secondary | ICD-10-CM | POA: Diagnosis not present

## 2023-06-20 DIAGNOSIS — S42421A Displaced comminuted supracondylar fracture without intercondylar fracture of right humerus, initial encounter for closed fracture: Secondary | ICD-10-CM | POA: Diagnosis not present

## 2023-06-20 DIAGNOSIS — I890 Lymphedema, not elsewhere classified: Secondary | ICD-10-CM | POA: Diagnosis not present

## 2023-06-20 MED ORDER — BISACODYL 10 MG RE SUPP
10.0000 mg | Freq: Once | RECTAL | Status: AC
  Administered 2023-06-20: 10 mg via RECTAL
  Filled 2023-06-20: qty 1

## 2023-06-20 MED ORDER — POLYETHYLENE GLYCOL 3350 17 G PO PACK
17.0000 g | PACK | Freq: Every day | ORAL | Status: DC
  Administered 2023-06-20: 17 g via ORAL
  Filled 2023-06-20: qty 1

## 2023-06-20 MED ORDER — OXYCODONE HCL 5 MG PO TABS: 5.0000 mg | ORAL_TABLET | Freq: Three times a day (TID) | ORAL | 0 refills | Status: DC | PRN

## 2023-06-20 MED ORDER — SENNOSIDES-DOCUSATE SODIUM 8.6-50 MG PO TABS: 1.0000 | ORAL_TABLET | Freq: Every day | ORAL | Status: DC

## 2023-06-20 MED ORDER — POLYETHYLENE GLYCOL 3350 17 G PO PACK: 17.0000 g | PACK | Freq: Every day | ORAL | 0 refills | Status: DC

## 2023-06-20 NOTE — Discharge Summary (Signed)
Physician Discharge Summary   Patient: Regina Snyder MRN: [id number] DOB: 03/04/1976  Admit date:     06/14/2023  Discharge date: 06/20/23  Discharge Physician: Alberteen Sam   PCP: Sherlyn Hay, DO     Recommendations at discharge:  Follow up with Orthopedics Dr. Ave Filter in 1 week on Aug 5      Discharge Diagnoses: Principal Problem:   Closed fracture of right proximal humerus Active Problems:   Right humeral fracture   Obesity, Class III, BMI 40-49.9 (morbid obesity) (HCC)   Obesity hypoventilation syndrome (HCC)   Right supracondylar humerus fracture, closed, initial encounter   Hematoma of frontal scalp   Chronic acquired lymphedema     Hospital Course: Regina Snyder is a 47 y.o. F with obesity who presented after fall from her porch.  In the ER, found to have severely comminuted displaced fracture of the head, neck, and proximal shaft of the right humerus.  Transferred to [facility] for surgical repair.     Supracondylar fracture of the right humerus Patient was admitted and taken to the OR for open reduction internal fixation of the proximal right humerus by Dr. Ave Filter on 7/22.  Her postoperative course complicated, pain was well-controlled with oral agents, and home health was arranged.           The [facility] Controlled Substances Registry was reviewed for this patient prior to discharge.  Consultants: Orthopedics, Dr. Ave Filter Procedures performed: Open reduction, internal fixation right humerus Disposition: Home health Diet recommendation:  Discharge Diet Orders (From admission, onward)     Start     Ordered   06/19/23 0000  Diet general        06/19/23 0814             DISCHARGE MEDICATION: Allergies as of 06/20/2023       Reactions   Codeine Nausea And Vomiting   Z-pak [azithromycin] Other (See Comments)   Unknown reaction   Nickel Rash   Vibra-tab [doxycycline] Rash        Medication List     TAKE these  medications    acetaminophen 325 MG tablet Commonly known as: TYLENOL Take 2 tablets (650 mg total) by mouth every 6 (six) hours as needed for mild pain (or Fever >/= 101).   Advil 200 MG tablet Generic drug: ibuprofen Take 600-800 mg by mouth daily as needed for headache or moderate pain.   chlorhexidine 4 % external liquid Commonly known as: HIBICLENS Apply 15 mLs (1 Application total) topically as directed for 30 doses. Use as directed daily for 5 days every other week for 6 weeks.   mupirocin ointment 2 % Commonly known as: BACTROBAN Place 1 Application into the nose 2 (two) times daily for 60 doses. Use as directed 2 times daily for 5 days every other week for 6 weeks.   oxyCODONE 5 MG immediate release tablet Commonly known as: Roxicodone Take 1 tablet (5 mg total) by mouth every 8 (eight) hours as needed.   polyethylene glycol 17 g packet Commonly known as: MIRALAX / GLYCOLAX Take 17 g by mouth daily. Start taking on: June 21, 2023   senna-docusate 8.6-50 MG tablet Commonly known as: Senokot-S Take 1 tablet by mouth at bedtime.        Follow-up Information     Jones Broom, MD. Schedule an appointment as soon as possible for a visit on 07/02/2023.   Specialty: Orthopedic Surgery Contact information: [address] [place]  [address] [phone]         Sherlyn Hay, DO Follow up.   Specialty: Family Medicine Contact information: [address] [place] [address] [phone]                 Discharge Instructions     Diet general   Complete by: As directed    Discharge instructions   Complete by: As directed    It has been a pleasure taking care of you!  You were hospitalized due to right arm fracture for which you have been treated surgically.  Following instruction by your orthopedic surgeon about postoperative care and follow-up.   Take care,   Discharge instructions   Complete by: As directed     **IMPORTANT DISCHARGE INSTRUCTIONS**   From Dr. Maryfrances Bunnell: You were admitted for a humerus fracture  You should follow up with your surgeon Dr. Ave Filter as directed  Adhere to all post-op instructions provided by your surgeon  For pain: Take acetaminophen [phone] mg (1 to 2 tabs) up to three times daily Take ibuprofen 400 mg up to three times daily for 1 week  Take oxycodone 5 mg up to three times daily   Ensure to take acetaminophen with oxycodone, as these work better together  Do not drive or drink alcohol with oxycodone  If you take oxycodone, take a bowel regimen: Take Senokot (docusate WITH senna) nightly Take MiraLAX once daily  If you have not had a BM after 2 days, increase both to twice daily until you have a BM   Acetaminophen, ibuprofen and oxycodone work differently, and can be taken together   Increase activity slowly   Complete by: As directed    Increase activity slowly   Complete by: As directed        Discharge Exam: Filed Weights   06/16/23 0500  Weight: (!) 189.9 kg    General: Pt is alert, awake, not in acute distress, ecchymosis over the left eye HEENT: There is no crepitus in the jaw, jaw opens normally Cardiovascular: RRR, nl S1-S2, no murmurs appreciated.   No LE edema.   Respiratory: Normal respiratory rate and rhythm.  CTAB without rales or wheezes. Abdominal: Abdomen soft and non-tender.  No distension or HSM.   Neuro/Psych: Strength symmetric in upper and lower extremities.  Judgment and insight appear normal.   Condition at discharge: good  The results of significant diagnostics from this hospitalization (including imaging, microbiology, ancillary and laboratory) are listed below for reference.   Imaging Studies: DG Humerus Right  Result Date: 06/18/2023 CLINICAL DATA:  Known proximal right humeral fracture EXAM: RIGHT HUMERUS - 2+ VIEW COMPARISON:  06/13/2023 FLUOROSCOPY TIME:  Radiation Exposure Index (as provided by the  fluoroscopic device): 5.05 mGy If the device does not provide the exposure index: Fluoroscopy Time:  35 seconds Number of Acquired Images:  8 FINDINGS: Initial images again demonstrate the comminuted proximal right humeral fracture. Fracture fragments within reduced and multiple fixation screws placed. Additionally fixation sideplate with multiple fixation screws was placed. Fracture fragments are in near anatomic alignment. IMPRESSION: ORIF proximal right humeral fracture. Electronically Signed   By: Alcide Clever M.D.   On: 06/18/2023 22:45   DG C-Arm 1-60 Min-No Report  Result Date: 06/18/2023 Fluoroscopy was utilized by the requesting physician.  No radiographic interpretation.   DG C-Arm 1-60 Min-No Report  Result Date: 06/18/2023 Fluoroscopy was utilized by the requesting physician.  No radiographic interpretation.   CT HUMERUS RIGHT WO CONTRAST  Result Date: 06/13/2023 CLINICAL DATA:  Fall, right shoulder injury EXAM: CT OF THE RIGHT HUMERUS WITHOUT CONTRAST TECHNIQUE: Multidetector CT imaging was performed according to the standard protocol. Multiplanar CT image reconstructions were also generated. RADIATION DOSE REDUCTION: This exam was performed according to the departmental dose-optimization program which includes automated exposure control, adjustment of the mA and/or kV according to patient size and/or use of iterative reconstruction technique. COMPARISON:  None Available. FINDINGS: Bones/Joint/Cartilage There is a markedly comminuted fracture of the proximal right humerus. Fractures within the right humeral head involving the greater tuberosity, lesser tuberosity, anatomical neck and surgical neck. There is impaction of the articular surface of the humeral head with resultant articular incongruity involving the regions adjacent to the greater tuberosity and lesser tuberosity. There is inferior subluxation of the articular surface of the humeral head in relation of the glenoid fossa without  frank dislocation. There is, additionally, an oblique fracture of the proximal right femoral diaphysis distally with 1 shaft with medial displacement and 2-3 cm override of the humeral shaft. Visualized clavicle is intact. The scapula is intact. Mild acromioclavicular degenerative arthritis. Limited evaluation of the right thoracic cage is unremarkable. Ligaments Suboptimally assessed by CT. Muscles and Tendons Soft tissue evaluation is limited by beam attenuation and underpenetration. Grossly unremarkable. Soft tissues Unremarkable IMPRESSION: 1. Markedly comminuted fracture of the proximal right humerus as described above. 2. Oblique fracture of the proximal right humeral diaphysis with 1 shaft with medial displacement and 2-3 cm override of the humeral shaft. Electronically Signed   By: Helyn Numbers M.D.   On: 06/13/2023 22:03   DG Shoulder Right  Result Date: 06/13/2023 CLINICAL DATA:  Trauma, fall EXAM: RIGHT SHOULDER - 2+ VIEW COMPARISON:  None Available. FINDINGS: Limited single AP view is submitted for review. The severely comminuted fracture in the head, neck and proximal shaft of right humerus. There is 2.3 cm offset in alignment of distal fracture fragments. Possibility of dislocation could not be evaluated. If clinically warranted, follow-up CT may be considered. IMPRESSION: Severely comminuted displaced fracture is seen in the head, neck and proximal shaft of right humerus. Possibility of dislocation could not be evaluated. If clinically warranted, follow-up CT of right shoulder may be considered. Electronically Signed   By: Ernie Avena M.D.   On: 06/13/2023 21:08   DG Knee Complete 4 Views Left  Result Date: 06/13/2023 CLINICAL DATA:  Trauma, fall, pain EXAM: LEFT KNEE - COMPLETE 4+ VIEW COMPARISON:  None Available. FINDINGS: No recent fracture or dislocation is seen in left knee. Small bony spurs are noted in the medial compartment. There is narrowing of joint space in the medial  compartment. IMPRESSION: No recent fracture or dislocation is seen in left knee. Degenerative changes are noted in the medial compartment. Electronically Signed   By: Ernie Avena M.D.   On: 06/13/2023 21:06   DG Hand Complete Left  Result Date: 06/13/2023 CLINICAL DATA:  Trauma, fall, pain EXAM: LEFT HAND - COMPLETE 3+ VIEW COMPARISON:  None Available. FINDINGS: Monitoring devices is partly obscuring the distal phalanx of index finger. No displaced fracture or dislocation is seen. No opaque foreign bodies are seen. IMPRESSION: No fracture or dislocation is seen in left hand. Electronically Signed   By: Ernie Avena M.D.   On: 06/13/2023 21:05   DG Elbow Complete Right  Result Date: 06/13/2023 CLINICAL DATA:  Arm pain after fall EXAM: RIGHT ELBOW - COMPLETE 3+ VIEW COMPARISON:  None Available. FINDINGS: Subtle lucency through the coronoid process of  the ulna. Otherwise no acute fracture. No elbow joint effusion. Soft tissues are unremarkable. IMPRESSION: Subtle lucency through the coronoid process of the ulna is favored chronic or projectional with acute fracture considered less likely given lack of elbow joint effusion. If this correlates with site of pain, CT is recommended. Electronically Signed   By: Minerva Fester M.D.   On: 06/13/2023 21:04   CT Head Wo Contrast  Result Date: 06/13/2023 CLINICAL DATA:  Fall EXAM: CT HEAD WITHOUT CONTRAST TECHNIQUE: Contiguous axial images were obtained from the base of the skull through the vertex without intravenous contrast. RADIATION DOSE REDUCTION: This exam was performed according to the departmental dose-optimization program which includes automated exposure control, adjustment of the mA and/or kV according to patient size and/or use of iterative reconstruction technique. COMPARISON:  None Available. FINDINGS: Brain: There is no mass, hemorrhage or extra-axial collection. The size and configuration of the ventricles and extra-axial CSF spaces  are normal. The brain parenchyma is normal, without acute or chronic infarction. Small focus of pineal calcification. Mega cisterna magna. Vascular: No abnormal hyperdensity of the major intracranial arteries or dural venous sinuses. No intracranial atherosclerosis. Skull: Left frontal scalp hematoma without skull fracture. Sinuses/Orbits: No fluid levels or advanced mucosal thickening of the visualized paranasal sinuses. No mastoid or middle ear effusion. The orbits are normal. IMPRESSION: 1. No acute intracranial abnormality. 2. Left frontal scalp hematoma without skull fracture. Electronically Signed   By: Deatra Robinson M.D.   On: 06/13/2023 20:33   CT Cervical Spine Wo Contrast  Result Date: 06/13/2023 CLINICAL DATA:  Larey Seat down stairs EXAM: CT CERVICAL SPINE WITHOUT CONTRAST TECHNIQUE: Multidetector CT imaging of the cervical spine was performed without intravenous contrast. Multiplanar CT image reconstructions were also generated. RADIATION DOSE REDUCTION: This exam was performed according to the departmental dose-optimization program which includes automated exposure control, adjustment of the mA and/or kV according to patient size and/or use of iterative reconstruction technique. COMPARISON:  None Available. FINDINGS: Alignment: No subluxation.  Facet alignment within normal limits. Skull base and vertebrae: No acute fracture. No primary bone lesion or focal pathologic process. Soft tissues and spinal canal: No prevertebral fluid or swelling. No visible canal hematoma. Disc levels: Multilevel degenerative change with mild to moderate disc space narrowing C5-C6 and C6-C7. Upper chest: Negative. Other: None IMPRESSION: Degenerative changes. No CT evidence for acute osseous abnormality. Electronically Signed   By: Jasmine Pang M.D.   On: 06/13/2023 20:31    Microbiology: Results for orders placed or performed during the hospital encounter of 06/14/23  Surgical pcr screen     Status: Abnormal    Collection Time: 06/15/23  5:57 AM   Specimen: Nasal Mucosa; Nasal Swab  Result Value Ref Range Status   MRSA, PCR POSITIVE (A) NEGATIVE Final    Comment: RESULT CALLED TO, READ BACK BY AND VERIFIED WITH: RN ASHLEY ON [id number] @[phone]  BY SM    Staphylococcus aureus POSITIVE (A) NEGATIVE Final    Comment: (NOTE) The Xpert SA Assay (FDA approved for NASAL specimens in patients 82 years of age and older), is one component of a comprehensive surveillance program. It is not intended to diagnose infection nor to guide or monitor treatment. Performed at [facility], [address], [place], [place] [address]     Labs: CBC: Recent Labs  Lab 06/14/23 0117 06/14/23 1119 06/15/23 0500 06/16/23 0201 06/18/23 0241 06/19/23 0040  WBC 11.1* 7.5 6.8 8.0 7.1 9.2  NEUTROABS 9.6*  --   --   --   --   --  HGB 10.9* 10.5* 10.5* 9.9* 10.3* 9.8*  HCT 36.2 33.6* 34.5* 32.4* 33.0* 31.0*  MCV 86.2 85.1 86.0 86.2 87.1 86.1  PLT 208 196 192 196 216 231   Basic Metabolic Panel: Recent Labs  Lab 06/14/23 1119 06/15/23 0500 06/16/23 0201 06/18/23 0241 06/19/23 0918  NA 134* 136 136 135 137  K 3.6 3.7 3.6 3.2* 3.9  CL 102 98 99 96* 97*  CO2 26 29 29 31 28   GLUCOSE 127* 109* 111* 108* 135*  BUN 7 5* 6 7 9   CREATININE 0.60 0.75 0.67 0.65 0.65  CALCIUM 8.2* 8.5* 8.3* 8.4* 8.8*  MG  --  2.0 1.9 1.8 2.0  PHOS  --  2.8 3.2 3.5 3.1   Liver Function Tests: Recent Labs  Lab 06/14/23 1119 06/15/23 0500 06/16/23 0201 06/18/23 0241 06/19/23 0918  AST 15  --   --  34  --   ALT 23  --   --  39  --   ALKPHOS 55  --   --  58  --   BILITOT 1.1  --   --  2.2*  --   PROT 6.1*  --   --  6.0*  --   ALBUMIN 2.9* 2.9* 2.7* 2.6* 2.8*   CBG: No results for input(s): "GLUCAP" in the last 168 hours.  Discharge time spent: approximately 35 minutes spent on discharge counseling, evaluation of patient on day of discharge, and coordination of discharge planning with nursing, social work, pharmacy and  case management  Signed: Alberteen Sam, MD [facility] 06/20/2023

## 2023-06-20 NOTE — Plan of Care (Signed)
  Problem: Education: Goal: Knowledge of General Education information will improve Description: Including pain rating scale, medication(s)/side effects and non-pharmacologic comfort measures Outcome: Progressing   Problem: Clinical Measurements: Goal: Ability to maintain clinical measurements within normal limits will improve Outcome: Progressing   Problem: Health Behavior/Discharge Planning: Goal: Ability to manage health-related needs will improve Outcome: Progressing   Problem: Nutrition: Goal: Adequate nutrition will be maintained Outcome: Progressing   Problem: Coping: Goal: Level of anxiety will decrease Outcome: Progressing   Problem: Elimination: Goal: Will not experience complications related to bowel motility Outcome: Progressing   Problem: Pain Managment: Goal: General experience of comfort will improve Outcome: Progressing   Problem: Safety: Goal: Ability to remain free from injury will improve Outcome: Progressing   Problem: Skin Integrity: Goal: Risk for impaired skin integrity will decrease Outcome: Progressing

## 2023-06-20 NOTE — Progress Notes (Signed)
Occupational Therapy Treatment Patient Details Name: Regina Snyder MRN: [id number] DOB: [birth year] Today's Date: 06/20/2023   History of present illness Pt is 47 yo female who presents on 06/13/23 after falling down stairs and sustained hematoma R forehead and R proximal humerus fx. Underwent ORIF on 06/18/23. PMH: morbid obesity and obesity hypoventilation syndrome   OT comments  Pt progressing well.  Continues to require min guard for safety with mobility, setup to mod assist required for ADLs. Limited by R UE pain.  Reviewed and completed exercises (see below), discussed ADL compensatory techniques/DME and recommendations. She will have support at home for ADLs, IADLs. Will follow acutely.    Recommendations for follow up therapy are one component of a multi-disciplinary discharge planning process, led by the attending physician.  Recommendations may be updated based on patient status, additional functional criteria and insurance authorization.    Assistance Recommended at Discharge Frequent or constant Supervision/Assistance  Patient can return home with the following  A little help with walking and/or transfers;A lot of help with bathing/dressing/bathroom;Assistance with cooking/housework;Assist for transportation;Help with stairs or ramp for entrance   Equipment Recommendations  None recommended by OT    Recommendations for Other Services      Precautions / Restrictions Precautions Precautions: Fall;Shoulder Type of Shoulder Precautions: no shoulder ROM Shoulder Interventions: Shoulder sling/immobilizer;At all times Precaution Comments: okay for elbow, wrist and hand Required Braces or Orthoses: Sling Restrictions Weight Bearing Restrictions: Yes RUE Weight Bearing: Non weight bearing Other Position/Activity Restrictions: okay for elbow, wrist and hand ROM       Mobility Bed Mobility               General bed mobility comments: in recliner    Transfers Overall  transfer level: Needs assistance Equipment used: None Transfers: Sit to/from Stand Sit to Stand: Min guard                 Balance Overall balance assessment: Mild deficits observed, not formally tested                                         ADL either performed or assessed with clinical judgement   ADL Overall ADL's : Needs assistance/impaired     Grooming: Set up;Sitting           Upper Body Dressing : Moderate assistance;Sitting;Cueing for compensatory techniques;Cueing for UE precautions   Lower Body Dressing: Min guard;Sit to/from stand Lower Body Dressing Details (indicate cue type and reason): discussed compenstaory techniques and elastic waist band clothing to wear Toilet Transfer: Min guard;Ambulation Toilet Transfer Details (indicate cue type and reason): pt exiting bathroom min guard for safety   Toileting - Clothing Manipulation Details (indicate cue type and reason): pt reports continuing to need assist for toieting, educated on toileting aide for increased independence     Functional mobility during ADLs: Min guard      Extremity/Trunk Assessment              Vision       Perception     Praxis      Cognition Arousal/Alertness: Awake/alert Behavior During Therapy: Anxious Overall Cognitive Status: Within Functional Limits for tasks assessed  Exercises Exercises: Shoulder, Other exercises Shoulder Exercises Elbow Flexion: AAROM, Right, 10 reps, Seated Elbow Extension: AAROM, Right, 10 reps, Seated Wrist Flexion: AROM, Right, 10 reps, Seated Wrist Extension: AROM, Right, 10 reps, Seated Digit Composite Flexion: AROM, Right, 10 reps, Seated Composite Extension: AROM, Right, 10 reps, Seated Other Exercises Other Exercises: AROM, R 10 reps supination/pronation, seated    Shoulder Instructions       General Comments reviewed precautions and recommendations,  provided handouts for shoulder education, exercises, and sling mgmt    Pertinent Vitals/ Pain       Pain Assessment Pain Assessment: Faces Faces Pain Scale: Hurts even more Pain Location: R UE Pain Descriptors / Indicators: Sore Pain Intervention(s): Monitored during session, Limited activity within patient's tolerance, Repositioned, Patient requesting pain meds-RN notified  Home Living                                          Prior Functioning/Environment              Frequency  Min 1X/week        Progress Toward Goals  OT Goals(current goals can now be found in the care plan section)  Progress towards OT goals: Progressing toward goals  Acute Rehab OT Goals Patient Stated Goal: get better OT Goal Formulation: With patient Time For Goal Achievement: 07/03/23 Potential to Achieve Goals: Good  Plan Discharge plan remains appropriate;Frequency remains appropriate    Co-evaluation                 AM-PAC OT "6 Clicks" Daily Activity     Outcome Measure   Help from another person eating meals?: A Little Help from another person taking care of personal grooming?: A Little Help from another person toileting, which includes using toliet, bedpan, or urinal?: A Little Help from another person bathing (including washing, rinsing, drying)?: A Little Help from another person to put on and taking off regular upper body clothing?: A Lot Help from another person to put on and taking off regular lower body clothing?: A Little 6 Click Score: 17    End of Session Equipment Utilized During Treatment: Other (comment) (sling)  OT Visit Diagnosis: Other abnormalities of gait and mobility (R26.89);Pain Pain - Right/Left: Right Pain - part of body: Shoulder   Activity Tolerance Patient tolerated treatment well   Patient Left in chair;with call bell/phone within reach   Nurse Communication Mobility status;Patient requests pain meds        Time:  1010-1038 OT Time Calculation (min): 28 min  Charges: OT General Charges $OT Visit: 1 Visit OT Treatments $Self Care/Home Management : 23-37 mins  Barry Brunner, OT Acute Rehabilitation Services Office [phone]   Regina Snyder 06/20/2023, 10:48 AM

## 2023-06-20 NOTE — Progress Notes (Signed)
Discharge instructions given. Patient verbalized understanding and all questions were answered.  ?

## 2023-06-20 NOTE — TOC Transition Note (Signed)
Transition of Care [facility]) - CM/SW Discharge Note   Patient Details  Name: Regina Snyder MRN: [id number] Date of Birth: 05-07-1976  Transition of Care [facility]) CM/SW Contact:  Epifanio Lesches, RN Phone Number: 06/20/2023, 11:33 AM   Clinical Narrative:    Patient will DC to: home Anticipated DC date: 06/20/2023 Family notified: yes Transport by: car          -s/p  ORIF on 7/22 , R proximal humerus fx.   Per MD patient ready for DC today. RN, patient, and patient's family notified of DC. Pt from home alone. Pt states daughter / extended family to assist with care once d/c. Orders noted for home health services. Pt agreeable to home health services. Pt without preference. Referral made with [facility] and accepted, [facility] 06/21/2023. Pt without DME needs. Post hospital f/u noted on AVS. Pt without RX med concerns.  Family to provide transportation to home.  RNCM will sign off for now as intervention is no longer needed. Please consult [place] again if new needs arise.    Final next level of care: Home w Home Health Services Barriers to Discharge: No Barriers Identified   Patient Goals and CMS Choice   Choice offered to / list presented to : Patient  Discharge Placement                         Discharge Plan and Services Additional resources added to the After Visit Summary for                            [facility] Arranged: PT, OT HH Agency: [facility] Date [facility] Agency Contacted: 06/20/23 Time HH Agency Contacted: 1115 Representative spoke with at [facility] Agency: Tresa Endo  Social Determinants of Health (SDOH) Interventions SDOH Screenings   Food Insecurity: No Food Insecurity (06/16/2023)  Housing: Low Risk  (06/16/2023)  Transportation Needs: No Transportation Needs (06/16/2023)  Utilities: Not At Risk (06/16/2023)  Depression (PHQ2-9): Low Risk  (04/29/2020)  Tobacco Use: Low Risk  (06/18/2023)     Readmission Risk Interventions     No data to display

## 2023-06-20 NOTE — Progress Notes (Signed)
PATIENT ID: Regina Snyder  MRN: [id number]  DOB/AGE:  [birth year] / 47 y.o.  2 Days Post-Op Procedure(s) (LRB): OPEN REDUCTION INTERNAL FIXATION (ORIF) PROXIMAL HUMERUS FRACTURE (Right)  Subjective: Patient reports mild pain in the right shoulder. Continues to be concerned about ongoing numbness in the RUE.   Objective: Vital signs in last 24 hours: Temp:  [98 F (36.7 C)-98.3 F (36.8 C)] 98 F (36.7 C) (07/24 0757) Pulse Rate:  [93-103] 93 (07/24 0757) Resp:  [16-17] 16 (07/24 0409) BP: (106-121)/(40-71) 106/48 (07/24 0757) SpO2:  [92 %-97 %] 92 % (07/24 0757)  Intake/Output from previous day: 07/23 0701 - 07/24 0700 In: -  Out: 5 [Urine:5]   Recent Labs    06/18/23 0241 06/19/23 0040  HGB 10.3* 9.8*   Recent Labs    06/18/23 0241 06/19/23 0040 06/19/23 1559  WBC 7.1 9.2  --   RBC 3.79* 3.60* 4.08  HCT 33.0* 31.0*  --   PLT 216 231  --    Recent Labs    06/18/23 0241 06/19/23 0918  NA 135 137  K 3.2* 3.9  CL 96* 97*  CO2 31 28  BUN 7 9  CREATININE 0.65 0.65  GLUCOSE 108* 135*  CALCIUM 8.4* 8.8*    Physical Exam: Intact pulses distally Incision: dressing C/D/I No cellulitis present Compartment soft Patient has good motor function of right wrist, hand, and fingers but sensation to light touch still limited in RUE from nerve block RUE in sling  Assessment/Plan: 2 Days Post-Op Procedure(s) (LRB): OPEN REDUCTION INTERNAL FIXATION (ORIF) PROXIMAL HUMERUS FRACTURE (Right)   Reassured patient  once again that it is not unusual for her to still have numbness in the RUE due to the nerve block. Patient will work with therapy again today. Continue sling. Hopeful for DC home soon if pain is well managed with PO. Follow up in office on 8/5   Laurabelle Gorczyca L. Porterfield, PA-C 06/20/2023, 9:01 AM

## 2023-06-21 ENCOUNTER — Other Ambulatory Visit (HOSPITAL_COMMUNITY): Payer: Self-pay

## 2023-06-21 ENCOUNTER — Telehealth: Payer: Self-pay

## 2023-06-21 NOTE — Transitions of Care (Post Inpatient/ED Visit) (Signed)
   06/21/2023  Name: Regina Snyder MRN: [id number] DOB: [birth year]  Today's TOC FU Call Status: Today's TOC FU Call Status:: Successful TOC FU Call Competed Unsuccessful Call (1st Attempt) Date: 06/21/23 [facility] FU Call Complete Date: 06/21/23  Transition Care Management Follow-up Telephone Call Date of Discharge: 06/20/23 Discharge Facility: [facility]) Type of Discharge: Inpatient Admission Primary Inpatient Discharge Diagnosis:: fracture of humerus How have you been since you were released from the hospital?: Same Any questions or concerns?: No  Items Reviewed: Did you receive and understand the discharge instructions provided?: Yes Medications obtained,verified, and reconciled?: Partial Review Completed Reason for Partial Mediation Review: pharm had to send rx back, waiting on doctor Any new allergies since your discharge?: No Dietary orders reviewed?: Yes Do you have support at home?: Yes People in Home: friend(s)  Medications Reviewed Today: Medications Reviewed Today     Reviewed by Karena Addison, LPN (Licensed Practical Nurse) on 06/21/23 at 1520  Med List Status: <None>   Medication Order Taking? Sig Documenting Provider Last Dose Status Informant  acetaminophen (TYLENOL) 325 MG tablet [id number]  Take 2 tablets (650 mg total) by mouth every 6 (six) hours as needed for mild pain (or Fever >/= 101). Almon Hercules, MD  Active   chlorhexidine (HIBICLENS) 4 % external liquid [id number]  Apply 15 mLs (1 Application total) topically as directed for 30 doses. Use as directed daily for 5 days every other week for 6 weeks. Porterfield, Hospital doctor, PA-C  Active   ibuprofen (ADVIL) 200 MG tablet [id number] No Take 600-800 mg by mouth daily as needed for headache or moderate pain. [provider] Past Week Active Self  mupirocin ointment (BACTROBAN) 2 % [id number]  Place 1 Application into the nose 2 (two) times daily for 60 doses. Use as directed 2 times daily for 5 days every other  week for 6 weeks. Porterfield, Hospital doctor, PA-C  Active   oxyCODONE (ROXICODONE) 5 MG immediate release tablet [id number]  Take 1 tablet (5 mg total) by mouth every 8 (eight) hours as needed. Danford, Earl Lites, MD  Active   polyethylene glycol (MIRALAX / GLYCOLAX) 17 g packet [id number]  Take 17 g by mouth daily. Alberteen Sam, MD  Active   senna-docusate (SENOKOT-S) 8.6-50 MG tablet [id number]  Take 1 tablet by mouth at bedtime. Alberteen Sam, MD  Active             Home Care and Equipment/Supplies: Were Home Health Services Ordered?: Yes Name of Home Health Agency:: [facility] Has Agency set up a time to come to your home?: No Any new equipment or medical supplies ordered?: NA  Functional Questionnaire: Do you need assistance with bathing/showering or dressing?: Yes Do you need assistance with meal preparation?: No Do you need assistance with eating?: No Do you have difficulty maintaining continence: No Do you need assistance with getting out of bed/getting out of a chair/moving?: Yes Do you have difficulty managing or taking your medications?: No  Follow up appointments reviewed: PCP Follow-up appointment confirmed?: Yes Date of PCP follow-up appointment?: 06/27/23 Follow-up Provider: [facility] Follow-up appointment confirmed?: No Reason Specialist Follow-Up Not Confirmed: Patient has Specialist Provider Number and will Call for Appointment Do you need transportation to your follow-up appointment?: No Do you understand care options if your condition(s) worsen?: Yes-patient verbalized understanding    SIGNATURE Karena Addison, LPN [facility] Nurse Health Advisor Direct Dial [phone]

## 2023-06-21 NOTE — Transitions of Care (Post Inpatient/ED Visit) (Signed)
   06/21/2023  Name: Regina Snyder MRN: [id number] DOB: [birth year]  Today's TOC FU Call Status: Today's TOC FU Call Status:: Unsuccessul Call (1st Attempt) Unsuccessful Call (1st Attempt) Date: 06/21/23  Attempted to reach the patient regarding the most recent Inpatient/ED visit.  Follow Up Plan: Additional outreach attempts will be made to reach the patient to complete the Transitions of Care (Post Inpatient/ED visit) call.   Signature Karena Addison, LPN [facility] Nurse Health Advisor Direct Dial [phone]

## 2023-06-22 ENCOUNTER — Encounter (HOSPITAL_COMMUNITY): Payer: Self-pay | Admitting: Orthopedic Surgery

## 2023-06-22 ENCOUNTER — Other Ambulatory Visit: Payer: Self-pay | Admitting: Family Medicine

## 2023-06-22 MED ORDER — OXYCODONE HCL 5 MG PO TABS: 5.0000 mg | ORAL_TABLET | Freq: Three times a day (TID) | ORAL | 0 refills | Status: DC | PRN

## 2023-06-22 NOTE — Progress Notes (Signed)
Pharmacy called, they have no oxycodone in stock.  Sent to different pharmacy, first script canceled.

## 2023-06-27 ENCOUNTER — Ambulatory Visit: Payer: BC Managed Care – PPO | Admitting: Family Medicine

## 2023-06-27 ENCOUNTER — Encounter: Payer: Self-pay | Admitting: Family Medicine

## 2023-06-27 VITALS — BP 113/59 | HR 103 | Temp 98.6°F | Ht 70.0 in | Wt >= 6400 oz

## 2023-06-27 DIAGNOSIS — N951 Menopausal and female climacteric states: Secondary | ICD-10-CM

## 2023-06-27 DIAGNOSIS — Z09 Encounter for follow-up examination after completed treatment for conditions other than malignant neoplasm: Secondary | ICD-10-CM

## 2023-06-27 DIAGNOSIS — Z22322 Carrier or suspected carrier of Methicillin resistant Staphylococcus aureus: Secondary | ICD-10-CM

## 2023-06-27 DIAGNOSIS — E440 Moderate protein-calorie malnutrition: Secondary | ICD-10-CM

## 2023-06-27 DIAGNOSIS — S0003XD Contusion of scalp, subsequent encounter: Secondary | ICD-10-CM

## 2023-06-27 DIAGNOSIS — S42411D Displaced simple supracondylar fracture without intercondylar fracture of right humerus, subsequent encounter for fracture with routine healing: Secondary | ICD-10-CM | POA: Diagnosis not present

## 2023-06-27 DIAGNOSIS — I890 Lymphedema, not elsewhere classified: Secondary | ICD-10-CM

## 2023-06-27 DIAGNOSIS — Z8614 Personal history of Methicillin resistant Staphylococcus aureus infection: Secondary | ICD-10-CM

## 2023-06-27 DIAGNOSIS — E662 Morbid (severe) obesity with alveolar hypoventilation: Secondary | ICD-10-CM

## 2023-06-27 DIAGNOSIS — L03116 Cellulitis of left lower limb: Secondary | ICD-10-CM | POA: Insufficient documentation

## 2023-06-27 MED ORDER — CHLORHEXIDINE GLUCONATE 4 % EX SOLN
1.0000 | CUTANEOUS | 1 refills | Status: DC
Start: 2023-06-27 — End: 2023-07-31

## 2023-06-27 MED ORDER — MUPIROCIN 2 % EX OINT
1.0000 | TOPICAL_OINTMENT | Freq: Two times a day (BID) | CUTANEOUS | 0 refills | Status: AC
Start: 2023-06-27 — End: 2023-07-27

## 2023-06-27 MED ORDER — SULFAMETHOXAZOLE-TRIMETHOPRIM 800-160 MG PO TABS: 1.0000 | ORAL_TABLET | Freq: Two times a day (BID) | ORAL | 0 refills | Status: AC

## 2023-06-27 NOTE — Progress Notes (Addendum)
Established patient visit   Patient: Regina Snyder   DOB: 06-02-1976   47 y.o. Female  MRN: [id number] Visit Date: 06/27/2023  Today's healthcare provider: Sherlyn Hay, DO   Chief Complaint  Patient presents with   Medical Management of Chronic Issues    Hospital follow up. Patient was dragged by dog, Cause patient to hit the side of her face and chest, broke humerus on right arm, Pain in left leg mostly knee    Subjective    HPI HPI     Medical Management of Chronic Issues    Additional comments: Hospital follow up. Patient was dragged by dog, Cause patient to hit the side of her face and chest, broke humerus on right arm, Pain in left leg mostly knee       Last edited by Sherlyn Hay, DO on 06/29/2023  1:47 PM.      Patient is being seen after discharge from the hospital status post ORIF repair of closed fracture of right proximal (supracondylar) humerus 06/18/2023.  Patient took her daughter's dog out onto the porch to let it relieve itself; it saw an animal and charged at it, dragging her down in the process and causing her to break her arm.  She continues to have numbness over her left eyebrow where she had a hematoma; the hematoma has mostly resolved at this time.  Acquired lymphedema  - She believes she injured herself during the mechanical fall and states that she did very poorly with SCDs in the hospital; she was unable to sleep with them on, in particular, because made her dream snakes were crawling on her.  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  She is requesting a note providing restrictions, due to her frequent need to lift and move students, such as when changing diapers.  She also normally has to walk further than she can tolerate at this point.   OT went to her house, as did PT (she passed her evaluation with PT and they will be clearing her). F/u  w/surg scheduled for 07/02/23 (Dr. Ave Filter) Denies numbness and tingling of her hands.  She does have numbness over various aspects of her lower extremities, particularly of the left lateral to and underneath the knee.  Her anterior shin in particular has an area that is painful.  Percocet in the hospital messed up her BMs; she had no BM for one week, after hospitalization. Ecchymosiss under left eye  Hx MRSA - has not yet picked up hibiclens or mupirocin.  She did not realize she was positive in the hospital and thought these were prescribed for her history of infection.  No menstrual cycle since February 2024.     Medications: Outpatient Medications Prior to Visit  Medication Sig   acetaminophen (TYLENOL) 325 MG tablet Take 2 tablets (650 mg total) by mouth every 6 (six) hours as needed for mild pain (or Fever >/= 101).   ibuprofen (ADVIL) 200 MG tablet Take 600-800 mg by mouth daily as needed for headache or moderate pain.   oxyCODONE (ROXICODONE) 5 MG immediate release tablet Take 1 tablet (5 mg total) by mouth every 8 (eight) hours as needed.   polyethylene glycol (MIRALAX / GLYCOLAX) 17 g packet Take 17 g by mouth daily.   senna-docusate (SENOKOT-S) 8.6-50 MG tablet Take 1 tablet by mouth  at bedtime.   [DISCONTINUED] chlorhexidine (HIBICLENS) 4 % external liquid Apply 15 mLs (1 Application total) topically as directed for 30 doses. Use as directed daily for 5 days every other week for 6 weeks.   [DISCONTINUED] mupirocin ointment (BACTROBAN) 2 % Place 1 Application into the nose 2 (two) times daily for 60 doses. Use as directed 2 times daily for 5 days every other week for 6 weeks.   No facility-administered medications prior to visit.    Review of Systems  Constitutional:  Positive for fatigue. Negative for chills and fever.  Respiratory:  Negative for cough, chest tightness, shortness of breath and wheezing.   Cardiovascular:  Positive for leg swelling. Negative for chest pain and  palpitations.  Musculoskeletal:  Positive for myalgias.  Skin:  Positive for color change and wound (surgical; operative dressing still in place).  Neurological:  Positive for numbness and headaches. Negative for dizziness, tremors and light-headedness.  Psychiatric/Behavioral:  Positive for agitation. The patient is nervous/anxious (tearful upon learning MRSA +status; extremely concerned about infecting significant other).          Objective    BP (!) 113/59 (BP Location: Left Arm, Patient Position: Sitting, Cuff Size: Large)   Pulse (!) 103   Temp 98.6 F (37 C) (Oral)   Ht 5\' 10"  (1.778 m)   Wt (!) 415 lb 8 oz (188.5 kg)   SpO2 97%   BMI 59.62 kg/m      Physical Exam Constitutional:      Appearance: Normal appearance.  HENT:     Head: Normocephalic and atraumatic.      Comments: Healing subdermal hematoma in area denoted in purple. Mild swelling (almost fully resolved) in area denoted in yellow. Eyes:     General: No scleral icterus.    Extraocular Movements: Extraocular movements intact.     Conjunctiva/sclera: Conjunctivae normal.  Cardiovascular:     Rate and Rhythm: Normal rate and regular rhythm.     Pulses: Normal pulses.     Heart sounds: Normal heart sounds.  Pulmonary:     Effort: Pulmonary effort is normal. No respiratory distress.     Breath sounds: Normal breath sounds.  Abdominal:     General: Bowel sounds are normal. There is no distension.     Palpations: Abdomen is soft. There is no mass.     Tenderness: There is no abdominal tenderness. There is no guarding.  Musculoskeletal:        General: Tenderness present.     Right knee: Swelling present.     Left knee: Swelling present. Tenderness (Generalized pain over the anterolateral region) present.     Right lower leg: Edema present.     Left lower leg: Edema present.  Skin:    General: Skin is warm and dry.     Findings: Erythema (as noted in image) present. No abrasion, abscess, bruising,  ecchymosis, laceration, lesion or wound.          Comments: Right lower extremity also has some redness but is not warm to touch relative to other areas; it also is not painful.  Neurological:     Mental Status: She is alert and oriented to person, place, and time. Mental status is at baseline.  Psychiatric:        Mood and Affect: Mood is depressed. Affect is tearful.        Behavior: Behavior normal.      No results found for any visits on 06/27/23.  Assessment & Plan  Hospital discharge follow-up  Closed supracondylar fracture of right humerus with routine healing, subsequent encounter Assessment & Plan: Patient endorses doing well overall.  She does Not have signs or symptoms of neurovascular compromise in her right upper extremity.  She will be following up with her surgeon 07/02/2023.  Advised her not to remove her operative-dressing until that time.  A small portion did lift up near her axilla when she was sponge bathing.  She will be endeavoring to keep it in place and keep the area clean, dry and intact.   Cellulitis of left lower extremity Assessment & Plan: Patient's left lower anterior extremity is concerning for cellulitis.  Given her recent hospitalization and positive MRSA status, I prescribed Bactrim as noted below.  Patient to follow-up if she is not improving.  Orders: -     Sulfamethoxazole-Trimethoprim; Take 1 tablet by mouth 2 (two) times daily for 14 days.  Dispense: 28 tablet; Refill: 0  Positive result for methicillin resistant Staphylococcus aureus (MRSA) screening Assessment & Plan: Patient was not aware that she had tested positive from the hospital for MRSA again and was very distressed and tearful when I informed her of this.  I counseled her that this is more something to be aware of them overly concerned by, as some people are lifetime carriers; I explained that this is why she was being treated with the chlorhexidine and the mupirocin (which she was  prescribed but has not started).  She noted that the prescriptions were sent [facility] the [facility] [place] and requested they be sent locally instead.  I will be resending them today.  Orders: -     Mupirocin; Place 1 Application into the nose 2 (two) times daily for 60 doses. Use as directed 2 times daily for 5 days every other week for 6 weeks.  Dispense: 60 g; Refill: 0 -     Chlorhexidine Gluconate; Apply 15 mLs (1 Application total) topically as directed for 30 doses. Use as directed daily for 5 days every other week for 6 weeks.  Dispense: 946 mL; Refill: 1 -     Sulfamethoxazole-Trimethoprim; Take 1 tablet by mouth 2 (two) times daily for 14 days.  Dispense: 28 tablet; Refill: 0  Chronic acquired lymphedema Assessment & Plan: Discussed with the patient that this is something that has to be managed daily and is usually a lifelong problem.  When I counseled her regarding compressive therapy for lymphedema, she was very concerned as she did not tolerate SCDs well while in the hospital.  I explained to her that it is a very important component of therapy in order to prevent the lymphedema from becoming worse over time.  She expressed understanding.  I am referring her to occupational therapy for her lymphedema as noted below.  I also recommended some exercises she can try to try to naturally pump fluids out of her legs.  Orders: -     Ambulatory referral to Occupational Therapy  Hematoma of frontal scalp, subsequent encounter Assessment & Plan: Patient has had significant improvement of this since her visit, though she endorses continued mild headaches.  Recommended over-the-counter ibuprofen/Tylenol as needed.   Moderate protein-calorie malnutrition (HCC)  Obesity, Class III, BMI 40-49.9 (morbid obesity) (HCC)  Obesity hypoventilation syndrome (HCC)  Perimenopause Assessment & Plan: Noted.  Will continue to monitor to confirm when postmenopausal.  Most recent menstrual cycle February  2024     Return in about 2 weeks (around 07/11/2023) for cellulitis.  I discussed the assessment and treatment plan with the patient  The patient was provided an opportunity to ask questions and all were answered. The patient agreed with the plan and demonstrated an understanding of the instructions.   The patient was advised to call back or seek an in-person evaluation if the symptoms worsen or if the condition fails to improve as anticipated.  Total time was 60 minutes. That includes chart review before the visit, the actual patient visit, and time spent on documentation after the visit.     Sherlyn Hay, DO  [facility] [phone] (phone) [phone] (fax)  [facility]

## 2023-06-27 NOTE — Patient Instructions (Addendum)
Take ibuprofen three 200 mg ibupofen (advil) every six hours for seven days. (Maximum 24 hour dose is 2400 mg which is 12 tabs)  Keep legs elevated whenever seat Bench press and leg press   I would recommend eating a probiotic type food around lunch (several hour away from the morning and evening doses of antibiotic), to help promote normal bacteria maintenance in your intestines.

## 2023-06-29 DIAGNOSIS — N951 Menopausal and female climacteric states: Secondary | ICD-10-CM | POA: Insufficient documentation

## 2023-06-29 DIAGNOSIS — E440 Moderate protein-calorie malnutrition: Secondary | ICD-10-CM | POA: Insufficient documentation

## 2023-06-29 DIAGNOSIS — Z22322 Carrier or suspected carrier of Methicillin resistant Staphylococcus aureus: Secondary | ICD-10-CM | POA: Insufficient documentation

## 2023-06-29 NOTE — Assessment & Plan Note (Signed)
Patient has had significant improvement of this since her visit, though she endorses continued mild headaches.  Recommended over-the-counter ibuprofen/Tylenol as needed.

## 2023-06-29 NOTE — Assessment & Plan Note (Signed)
Patient was not aware that she had tested positive from the hospital for MRSA again and was very distressed and tearful when I informed her of this.  I counseled her that this is more something to be aware of them overly concerned by, as some people are lifetime carriers; I explained that this is why she was being treated with the chlorhexidine and the mupirocin (which she was prescribed but has not started).  She noted that [facility] [place] and requested they be sent locally instead.  I will be resending them today.

## 2023-06-29 NOTE — Assessment & Plan Note (Signed)
Patient's left lower anterior extremity is concerning for cellulitis.  Given her recent hospitalization and positive MRSA status, I prescribed Bactrim as noted below.  Patient to follow-up if she is not improving.

## 2023-06-29 NOTE — Assessment & Plan Note (Addendum)
Patient endorses doing well overall.  She does Not have signs or symptoms of neurovascular compromise in her right upper extremity.  She will be following up with her surgeon 07/02/2023.  Advised her not to remove her operative-dressing until that time.  A small portion did lift up near her axilla when she was sponge bathing.  She will be endeavoring to keep it in place and keep the area clean, dry and intact.

## 2023-06-29 NOTE — Assessment & Plan Note (Signed)
Discussed with the patient that this is something that has to be managed daily and is usually a lifelong problem.  When I counseled her regarding compressive therapy for lymphedema, she was very concerned as she did not tolerate SCDs well while in the hospital.  I explained to her that it is a very important component of therapy in order to prevent the lymphedema from becoming worse over time.  She expressed understanding.  I am referring her to occupational therapy for her lymphedema as noted below.  I also recommended some exercises she can try to try to naturally pump fluids out of her legs.

## 2023-06-29 NOTE — Assessment & Plan Note (Signed)
Noted.  Will continue to monitor to confirm when postmenopausal.  Most recent menstrual cycle February 2024

## 2023-07-03 ENCOUNTER — Ambulatory Visit: Payer: BC Managed Care – PPO | Attending: Family Medicine | Admitting: Occupational Therapy

## 2023-07-03 DIAGNOSIS — I890 Lymphedema, not elsewhere classified: Secondary | ICD-10-CM | POA: Insufficient documentation

## 2023-07-03 NOTE — Therapy (Unsigned)
OUTPATIENT OCCUPATIONAL THERAPY EVALUATION  LOWER EXTREMITY LYMPHEDEMA  Patient Name: Regina Snyder MRN: [id number] DOB:09/02/1976, 47 y.o., female Today's Date: 07/04/2023  END OF SESSION:   Past Medical History:  Diagnosis Date   Acute cough 11/07/2021   Allergy    Bumps on skin 01/19/2021   Cellulitis of left lower extremity 04/29/2020   COVID 11/07/2021   Skin infection 08/30/2020   Past Surgical History:  Procedure Laterality Date   eyelid surgery     ORIF HUMERUS FRACTURE Right 06/18/2023   Procedure: OPEN REDUCTION INTERNAL FIXATION (ORIF) PROXIMAL HUMERUS FRACTURE;  Surgeon: Jones Broom, MD;  Location: [facility] OR;  Service: Orthopedics;  Laterality: Right;   Patient Active Problem List   Diagnosis Date Noted   Moderate protein-calorie malnutrition (HCC) 06/29/2023   Positive result for methicillin resistant Staphylococcus aureus (MRSA) screening 06/29/2023   Perimenopause 06/29/2023   Cellulitis of left lower extremity 06/27/2023   Right supracondylar humerus fracture 06/14/2023   Closed fracture of right proximal humerus 06/14/2023   Obesity hypoventilation syndrome (HCC) 06/14/2023   Hematoma of frontal scalp 06/14/2023   Chronic acquired lymphedema 06/14/2023   History of headache 11/07/2021   History of COVID-19 01/19/2021   Sprain of knee 05/03/2020   Morbid obesity (HCC) 04/29/2020   Obesity, Class III, BMI 40-49.9 (morbid obesity) (HCC) 04/29/2020   Venous insufficiency 04/29/2020   History of MRSA infection 04/29/2020   Varicose veins of both lower extremities with inflammation 04/29/2020   Skin irritation 04/29/2020   Encounter for incision and drainage procedure 04/29/2020   Pain in both lower extremities 04/29/2020   Abdominal wall cellulitis 03/18/2020   Abscess of abdominal wall 03/18/2020   Abscess of right arm 03/18/2020    PCP: Jacquenette Shone, DO  REFERRING PROVIDER: same  REFERRING DIAG: I89.0  THERAPY DIAG:  Lymphedema, not elsewhere  classified  Rationale for Evaluation and Treatment: Rehabilitation  ONSET DATE: years ago  SUBJECTIVE:                                                                                                                                                                                    SUBJECTIVE STATEMENT:  Regina Snyder is referred to Occupational Therapy by Sherlyn Hay, DO, for evaluation and treatment of BLE lymphedema.Pt reports the swelling in her legs started "years ago" and has worsened over time.She is not sure if there is a family hx of limb swelling. Pt does not wear compression    garments on her legs.She has not previously undergone Complete Decongestive Therapy (CDT) for lymphedema.Pt does have a hx of recurrent cellulitis. Pt's goals for OT are to reduce leg swelling and keep it  from worsening.  PERTINENT HISTORY:  Medical hx relevant to LE: MO, class III. CVI BLE, Varicose veins w inflammation, knee sprain, supracondylar humerus fx, closed fx of the R proximal humerus, hx LLE cellulitids  PAIN:  Are you having pain? Yes: NPRS scale: 2/10 Pain location: L>R Pain description: tightness, full, heavy Aggravating factors: salt, sugar, sitting a lot with my feet down, hot water Relieving factors: elevation,   PRECAUTIONS: Lymphedema (legs) R Shoulder prox humerus Fx- stitches removed yesterday, FALLS  WEIGHT BEARING RESTRICTIONS: No  FALLS: Has patient fallen in last 6 months? Yes. 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  LEISURE: thrift shopping, movies, out to eat, spending time with daughter  HAND DOMINANCE: right   PRIOR LEVEL OF FUNCTION: Independent  PATIENT GOALS: To get help for my  leg   OBJECTIVE:  COGNITION:  Overall cognitive status: Within functional limits for tasks assessed, anxious, verbose   OBSERVATIONS / OTHER ASSESSMENTS:   FOTO: 61%  LYMPHEDEMA LIFE IMPACT SCALE (LLIS): 54.41%  POSTURE: head forward  LE ROM: AROM limited at hips, knees and ankles by girth and skin approximation  LE MMT: WFL for tasks required  SWELLING :  Moderate, Stage  II, Bilateral Lower Extremity Lymphedema 2/2 CVI and Obesity  Skin  Description Hyper-Keratosis Peau d' Orange Shiny Tight Fibrotic/ Indurated Fatty Doughy Spongy/ boggy   x   x L>R x  thighs   Skin dry Flaky WNL Macerated   mildly      Color Redness Varicosities Blanching Hemosiderin Stain Mottled   x x x  X x   Odor Malodorous Yeast Fungal infection  WNL      x   Temperature Warm Cool wnl    x     Pitting Edema   1+ 2+ 3+ 4+ Non-pitting         x   Girth Symmetrical Asymmetrical                   Distribution    L>R toes to groin    Stemmer Sign Positive Negative   +    Lymphorrhea History Of:  Present Absent     x    Wounds History Of Present Absent Venous Arterial Pressure Sheer     x        Signs of Infection Redness Warmth Erythema Acute Swelling Drainage Borders   x                 Sensation Light Touch Deep pressure Hypersensitivity   In Tact Impaired In Tact Impaired Absent Impaired   x  x  x     Nails WNL   Fungus nail dystrophy   x     Hair Growth Symmetrical Asymmetrical   x    Skin Creases Base of toes  Ankles   Base of Fingers knees       Abdominal pannus Thigh Lobules  Face/neck   x x  x        BLE COMPARATIVE LIMB VOLUMETRICS: To Be assessed  LANDMARK RIGHT    R LEG (A-D) N/A  R THIGH (E-G) ml  R FULL LIMB (A-G) ml  Limb Volume differential (LVD)  %  Volume change since initial %  Volume change overall V  (Blank rows = not tested)  LANDMARK LEFT   L  LEG (A-D) N/A  LTHIGH (E-G) ml  L  FULL LIMB (A-G) ml  Limb Volume differential  (LVD)  %  Volume change since initial %  Volume change overall %  (Blank rows = not tested)    GAIT: Distance walked: >500 ft Assistive device utilized: None Level of assistance: Complete Independence Comments: shuffling, wide gait   TODAY'S TREATMENT:                                                                                                                                         OT evaluation-lymphedema Pt edu  PATIENT EDUCATION:  Education details: Provided basic level education regarding lymphatic structure and function, etiology, onset patterns, stages of progression, and prevention to limit infection risk, worsening condition and further functional decline. Pt edu for aught interaction between blood circulatory system and lymphatic circulation.Discussed  impact of gravity and co-morbidities on lymphatic function. Outlined Complete Decongestive Therapy (CDT)  as standard of care and provided in depth information regarding 4 primary components of Intensive and Self Management Phases, including Manual Lymph Drainage (MLD), compression wrapping and garments, skin care, and therapeutic exercise. Homero Fellers discussion with re need for frequent attendance and high burden of care when caregiver is needed, impact of co morbidities. We discussed  the chronic, progressive nature of lymphedema and Importance of daily, ongoing LE self-care essential for limiting progression and infection risk.   Person educated: Patient Education method: Explanation, Demonstration, and Handouts Education comprehension: verbalized understanding, returned demonstration, verbal cues required, tactile cues required, and needs further education  HOME EXERCISE PROGRAM:  HOME EXERCISE PROGRAM: BLE lymphatic pumping there ex- 1 set of 10 each element, in order. Hold 5 2. Daily compression- knee or thigh length multilayer compression bandages during Intensive Phase CDT; During self-Management Phase appropriate thigh high  compression stockings paired with compression biker shorts (off the shelf or medical grade TBD) 3. Daily skin care with low ph lotion matching skin ph 4. Daily simple self MLD     ASSESSMENT:   CLINICAL IMPRESSION: Lymphedema treatment and the self-care home program are both demanding protocols requiring daily simple self-Manual Lymphatic Drainage (MLD), compression bandaging daily during the Intensive Phase and gradient compression garments during the self-management phase, daily skin inspection and care,  and daily therapeutuc, lymphatic pumping exercises.  At present Pt is unable to perform these activities due to recently sustaining a supracondylar humerus fx w ORIF and a closed fx of the R proximal humerus. Even with maximum assistance from a caregiver on a daily basis, having one leg wrapped in compression bandages while the arm is  in a sling and minimally functional increased Pt's fall risk significantly. She is unable to perform the other elements as well.  Pt is an appropriate candidate for CDT, howere, at later date once UE  fracture is healed. Lymphedema of the BLE impacts functional ambulation and mobility, ability to perform basic and instrumental ADLs ( reaching feet and distal legs to bathe, dress, groom nails, inspect skin, perform skin care), productive activities ( perform job duties), and leisure pursuits ( socializing with friends).   PROGNOSIS: Pt will benefit from assistance with daily, thigh length, multi  layer compression bandages between visits with due to difficulty reaching feet and distal legs to don and doff shoes and socks during evaluation. Issues presenting obstacles to a positive outcome include age, length of time condition has been untreated, and multiple  co morbidities and contributing factors, including commute from home is an hour each way, and need for daily assistance applying thigh length, multilayer compression wraps due to difficulty reaching feet and distal  legs  Positive prognosticators include motivation and medical stability.  Prognosis for optimal outcome os good with caregiver assistance between visits, regular   attendance and > 85% compliance with, and habituation of all  LE self care home program components.   OBJECTIVE IMPAIRMENTS: Abnormal gait, decreased balance, decreased knowledge of condition, decreased knowledge of use of DME, decreased mobility, decreased strength, increased edema, impaired sensation, postural dysfunction, and pain.    ACTIVITY LIMITATIONS: carrying, lifting, bending, sitting, standing, squatting, transfers, bed mobility, bathing, dressing, and hygiene/grooming   PARTICIPATION LIMITATIONS: meal prep, cleaning, laundry, shopping, community activity, yard work, and leisure pursuits and social activities requiring standing, walking and / or dependent sitting > 15 minutes. Pt frequently shifting in her seat during much of eval   PERSONAL FACTORS: Age, Behavior pattern, Past/current experiences, Time since onset of injury/illness/exacerbation, and 3+ co morbidities: OSA, OA, DVT  are also affecting patient's functional outcome.    REHAB POTENTIAL: Good   CLINICAL DECISION MAKING: Stable/uncomplicated   EVALUATION COMPLEXITY: Moderate     GOALS: Goals reviewed with patient? Yes   SHORT TERM GOALS: Target date: 4th OT Rx visit    Pt will demonstrate understanding of lymphedema precautions and prevention strategies with modified independence using a printed reference to identify at least 5 precautions and discussing how s/he may implement them into daily life to reduce risk of progression with modified assistance Baseline: max a Goal status: INITIAL   2.  With Max caregiver assistance Pt will be able to apply multilayer, knee length, compression wraps using gradient techniques to decrease limb volume, to limit infection risk, and to limit lymphedema progression.  Given this patient's Intake score of tbd % on the  Lymphedema Life Impact Scale (LLIS), patient will experience a reduction of at least 5% in her perceived level of functional impairment resulting from lymphedema to improve functional performance and quality of life (QOL). Baseline: Dependent Goal status: INITIAL     LONG TERM GOALS: Target date: 03/11/23 (12 WEEKS)     Given this patient's Intake score of tbd % on the Lymphedema Life Impact Scale (LLIS), patient will experience a reduction of at least 5% in her perceived level of functional impairment resulting from lymphedema to improve functional performance and quality of life (QOL).Baseline: tbd Baseline: max a Goal status: INITIAL   2.  Given this patient's Intake score of TBA/100% on the functional outcomes FOTO tool, patient will experience an increase in function of 5 points to improve basic and instrumental ADLs performance, including lymphedema self-care. (TBA at first OT Rx visit) Baseline: max a Goal status: INITIAL   3.  With modified independence (extra time and assistive devices) Pt will be able to don  and doff appropriate compression garments and/or devices to control BLE lymphedema and to limit progression.  Baseline: Dependent Goal status: INITIAL   4. Pt will achieve at least a 10% volume reductions bilaterally below the knees to return limb to more typical size and shape, to limit infection risk and LE progression, to decrease pain, to improve function, and to improve body image and QOL. Baseline: Dependent Goal status: INITIAL   5. Pt will achieve and sustain at least 85% attendance at OT sessions, and with compliance with all LE self-care home program components throughout CDT, including modified simple self-MLD, daily skin care and inspection, lymphatic pumping the ex and appropriate compression to limit lymphedema progression and to limit further functional decline. Baseline: Dependent Goal status: INITIAL       PLAN:   PT FREQUENCY: 2 x/week   PT DURATION:  12 weeks and PRN   PLANNED INTERVENTIONS: Therapeutic exercises, Therapeutic activity, Patient/Family education, Self Care, DME instructions, Manual lymph drainage, Compression bandaging, Taping, and Manual therapy   PLAN FOR NEXT SESSION:  Cont teaching compression wrapping Knee high wraps to RLE as established Pt EDU for LE self-care home program- There ex Commence MLD If time allows     ASSESSMENT:  CLINICAL IMPRESSION: Lymphedema has a high burden of self care. At present, due to multiple limitations resulting from recent proximal humerus fracture, Pt does not have the upper extremity function to perform LE home program between visits, including skin care, simple self MLD, compression bandaging one leg at a time , and performing therapeutic exercises. She does not have a caregiver to assist with these essential tasks. Pt is appropriate for Complete Decongestive Therapy for lymphedema care, but she should postpone CDT and focus her rehab efforts on recovering from and underdoing rehab for R arm/ shoulder frqacture.  OBJECTIVE IMPAIRMENTS: Abnormal gait, decreased knowledge of condition, decreased knowledge of use of DME, decreased ROM, increased edema, impaired UE functional use, obesity, pain, and chronic , progressive leg swelling and associated pain .   ACTIVITY LIMITATIONS: carrying, lifting, sitting, standing, squatting, sleeping, transfers, bed mobility, bathing, dressing, reach over head, hygiene/grooming, and caring for others  PARTICIPATION LIMITATIONS: meal prep, cleaning, laundry, shopping, community activity, yard work, and leisure pursuits and social activities requiring standing, walking and / or dependent sitting > 15 minutes. Pt frequently shifting in her seat during much of eval  PERSONAL FACTORS: Past/current experiences, 1-2 comorbidities, no caregiver assistance with lymphedema home program, , and limited functional use of dominant arm  are also affecting patient's  functional outcome.   REHAB POTENTIAL: Fair ***  EVALUATION COMPLEXITY: HIGH   GOALS: Goals reviewed with patient? {yes/no:20286}  SHORT TERM GOALS: Target date: ***  *** Baseline: Goal status: INITIAL  2.  *** Baseline:  Goal status: INITIAL  3.  *** Baseline:  Goal status: INITIAL  4.  *** Baseline:  Goal status: INITIAL  5.  *** Baseline:  Goal status: INITIAL  6.  *** Baseline:  Goal status: INITIAL  LONG TERM GOALS: Target date: ***  *** Baseline:  Goal status: INITIAL  2.  *** Baseline:  Goal status: INITIAL  3.  *** Baseline:  Goal status: INITIAL  4.  *** Baseline:  Goal status: INITIAL  5.  *** Baseline:  Goal status: INITIAL  6.  *** Baseline:  Goal status: INITIAL   PLAN:  OT FREQUENCY:  Lymphedema has a high burden of self care. At present, due to multiple limitations resulting from recent proximal humerus fracture, Pt  does not have the upper extremity function to perform LE home program between visits, including skin care, simple self MLD, compression bandaging one leg at a time , and performing therapeutic exercises.  She does not have a caregiver to assist with these essential tasks. Pt is appropriate for Complete Decongestive Therapy for lymphedema care, but she should postpone CDT and focus her rehab efforts on recovering from and underdoing rehab for R arm/ shoulder frqacture.  PT DURATION: {rehab duration:25117}  PLANNED INTERVENTIONS: {rehab planned interventions:25118::"Therapeutic exercises","Therapeutic activity","Neuromuscular re-education","Balance training","Gait training","Patient/Family education","Self Care","Joint mobilization"}  PLAN FOR NEXT SESSION: ***  Loel Dubonnet, MS, OTR/L, CLT-LANA 07/04/23 10:27 AM

## 2023-07-04 ENCOUNTER — Encounter: Payer: Self-pay | Admitting: Occupational Therapy

## 2023-07-04 ENCOUNTER — Telehealth: Payer: Self-pay

## 2023-07-04 NOTE — Telephone Encounter (Signed)
Copied from CRM [id number]. Topic: General - Other >> Jul 04, 2023  4:01 PM Everette C wrote: Reason for CRM: The patient would like to be contacted by a member of staff regarding their doctors note and light duty restrictions   The patient is requesting a doctors note that specifies the dates that they will be on light duty   Please contact further when possible  Patient is requesting an updated letter stating how long she needs to be on light duty. She reports her job is requesting clearance for MRSA. Or to test negative.  Patient aware that provider is out of the office until Monday 07/09/23.

## 2023-07-05 ENCOUNTER — Encounter: Payer: Self-pay | Admitting: Occupational Therapy

## 2023-07-05 NOTE — Addendum Note (Signed)
Addended by: Judithann Sauger on: 07/05/2023 09:15 AM   Modules accepted: Orders

## 2023-07-09 ENCOUNTER — Telehealth: Payer: Self-pay | Admitting: Family Medicine

## 2023-07-09 NOTE — Telephone Encounter (Signed)
1-  Patient wants to know if she can start driving Wednesday as she has to go back to work on Fri. August 16th.  Said she has a lot more motion in her arms now.    2 - she needs a note stating the length of time for restrictions and include in the note that she was not a candidate for lymphadema therapy, therefore it is not recommended for her to walk to the track.  (Due to the fact that the track is so far away from the buildings)  3 - Fax to Larey Seat [phone] by 07/13/23.  4 - let patient know when this letter has been faxed.

## 2023-07-10 NOTE — Telephone Encounter (Signed)
Pt called back to report that she needs the note prior to 07/13/2023. She says she needs this is as soon as possible. And also wants to know if she is cleared to drive? Please advise. She has more movement in her arms.

## 2023-07-16 ENCOUNTER — Ambulatory Visit: Payer: BC Managed Care – PPO | Admitting: Family Medicine

## 2023-07-23 NOTE — Addendum Note (Signed)
Addended by: Jacquenette Shone on: 07/23/2023 07:49 AM   Modules accepted: Level of Service

## 2023-07-25 ENCOUNTER — Ambulatory Visit: Payer: BC Managed Care – PPO | Admitting: Family Medicine

## 2023-07-30 ENCOUNTER — Telehealth: Payer: BC Managed Care – PPO | Admitting: Physician Assistant

## 2023-07-30 DIAGNOSIS — H109 Unspecified conjunctivitis: Secondary | ICD-10-CM

## 2023-07-30 MED ORDER — POLYMYXIN B-TRIMETHOPRIM 10000-0.1 UNIT/ML-% OP SOLN
1.0000 [drp] | OPHTHALMIC | 0 refills | Status: AC
Start: 2023-07-30 — End: ?

## 2023-07-30 NOTE — Patient Instructions (Signed)
Regina Snyder, thank you for joining Regina Loveless, PA-C for today's virtual visit.  While this provider is not your primary care provider (PCP), if your PCP is located in our provider database this encounter information will be shared with them immediately following your visit.   A [facility] MyChart account gives you access to today's visit and all your visits, tests, and labs performed at [facility] " click here if you don't have a [facility] MyChart account or go to [email or web address]  Consent: (Patient) Regina Snyder provided verbal consent for this virtual visit at the beginning of the encounter.  Current Medications:  Current Outpatient Medications:    trimethoprim-polymyxin b (POLYTRIM) ophthalmic solution, Place 1 drop into the left eye every 4 (four) hours. X 5 day, Disp: 10 mL, Rfl: 0   acetaminophen (TYLENOL) 325 MG tablet, Take 2 tablets (650 mg total) by mouth every 6 (six) hours as needed for mild pain (or Fever >/= 101)., Disp: , Rfl:    chlorhexidine (HIBICLENS) 4 % external liquid, Apply 15 mLs (1 Application total) topically as directed for 30 doses. Use as directed daily for 5 days every other week for 6 weeks., Disp: 946 mL, Rfl: 1   ibuprofen (ADVIL) 200 MG tablet, Take 600-800 mg by mouth daily as needed for headache or moderate pain., Disp: , Rfl:    oxyCODONE (ROXICODONE) 5 MG immediate release tablet, Take 1 tablet (5 mg total) by mouth every 8 (eight) hours as needed., Disp: 20 tablet, Rfl: 0   polyethylene glycol (MIRALAX / GLYCOLAX) 17 g packet, Take 17 g by mouth daily., Disp: 14 each, Rfl: 0   senna-docusate (SENOKOT-S) 8.6-50 MG tablet, Take 1 tablet by mouth at bedtime., Disp: , Rfl:    Medications ordered in this encounter:  Meds ordered this encounter  Medications   trimethoprim-polymyxin b (POLYTRIM) ophthalmic solution    Sig: Place 1 drop into the left eye every 4 (four) hours. X 5 day    Dispense:  10 mL    Refill:  0     Order Specific Question:   Supervising Provider    Answer:   Merrilee Jansky [[id number]]     *If you need refills on other medications prior to your next appointment, please contact your pharmacy*  Follow-Up: Call back or seek an in-person evaluation if the symptoms worsen or if the condition fails to improve as anticipated.  [facility] [phone]  Other Instructions Bacterial Conjunctivitis, Adult Bacterial conjunctivitis is an infection of your conjunctiva. This is the clear membrane that covers the white part of your eye and the inner part of your eyelid. This infection can make your eye: Red or pink. Itchy or irritated. This condition spreads easily from person to person (is contagious) and from one eye to the other eye. What are the causes? This condition is caused by germs (bacteria). You may get the infection if you come into close contact with: A person who has the infection. Items that have germs on them (are contaminated), such as face towels, contact lens solution, or eye makeup. What increases the risk? You are more likely to get this condition if: You have contact with people who have the infection. You wear contact lenses. You have a sinus infection. You have had a recent eye injury or surgery. You have a weak body defense system (immune system). You have dry eyes. What are the signs or symptoms?  Thick, yellowish discharge from the eye.  Tearing or watery eyes. Itchy eyes. Burning feeling in your eyes. Eye redness. Swollen eyelids. Blurred vision. How is this treated?  Antibiotic eye drops or ointment. Antibiotic medicine taken by mouth. This is used for infections that do not get better with drops or ointment or that last more than 10 days. Cool, wet cloths placed on the eyes. Artificial tears used 2-6 times a day. Follow these instructions at home: Medicines Take or apply your antibiotic medicine as told by your doctor. Do not stop using  it even if you start to feel better. Take or apply over-the-counter and prescription medicines only as told by your doctor. Do not touch your eyelid with the eye-drop bottle or the ointment tube. Managing discomfort Wipe any fluid from your eye with a warm, wet washcloth or a cotton ball. Place a clean, cool, wet cloth on your eye. Do this for 10-20 minutes, 3-4 times a day. General instructions Do not wear contacts until the infection is gone. Wear glasses until your doctor says it is okay to wear contacts again. Do not wear eye makeup until the infection is gone. Throw away old eye makeup. Change or wash your pillowcase every day. Do not share towels or washcloths. Wash your hands often with soap and water for at least 20 seconds and especially before touching your face or eyes. Use paper towels to dry your hands. Do not touch or rub your eyes. Do not drive or use heavy machinery if your vision is blurred. Contact a doctor if: You have a fever. You do not get better after 10 days. Get help right away if: You have a fever and your symptoms get worse all of a sudden. You have very bad pain when you move your eye. Your face: Hurts. Is red. Is swollen. You have sudden loss of vision. Summary Bacterial conjunctivitis is an infection of your conjunctiva. This infection spreads easily from person to person. Wash your hands often with soap and water for at least 20 seconds and especially before touching your face or eyes. Use paper towels to dry your hands. Take or apply your antibiotic medicine as told by your doctor. Contact a doctor if you have a fever or you do not get better after 10 days. This information is not intended to replace advice given to you by your health care provider. Make sure you discuss any questions you have with your health care provider. Document Revised: 02/23/2021 Document Reviewed: 02/23/2021 Elsevier Patient Education  2024 Elsevier Inc.    If you have  been instructed to have an in-person evaluation today at a local Urgent Care facility, please use the link below. It will take you to a list of all of our available [facility], including address, phone number and hours of operation. Please do not delay care.  [facility]  If you or a family member do not have a primary care provider, use the link below to schedule a visit and establish care. When you choose a [facility] primary care physician or advanced practice provider, you gain a long-term partner in health. Find a Primary Care Provider  Learn more about [facility] in-office and virtual care options: [facility] - Get Care Now

## 2023-07-30 NOTE — Progress Notes (Signed)
Virtual Visit Consent   Regina Snyder, you are scheduled for a virtual visit with a [facility] provider today. Just as with appointments in the office, your consent must be obtained to participate. Your consent will be active for this visit and any virtual visit you may have with one of our providers in the next 365 days. If you have a MyChart account, a copy of this consent can be sent to you electronically.  As this is a virtual visit, video technology does not allow for your provider to perform a traditional examination. This may limit your provider's ability to fully assess your condition. If your provider identifies any concerns that need to be evaluated in person or the need to arrange testing (such as labs, EKG, etc.), we will make arrangements to do so. Although advances in technology are sophisticated, we cannot ensure that it will always work on either your end or our end. If the connection with a video visit is poor, the visit may have to be switched to a telephone visit. With either a video or telephone visit, we are not always able to ensure that we have a secure connection.  By engaging in this virtual visit, you consent to the provision of healthcare and authorize for your insurance to be billed (if applicable) for the services provided during this visit. Depending on your insurance coverage, you may receive a charge related to this service.  I need to obtain your verbal consent now. Are you willing to proceed with your visit today? Regina Snyder has provided verbal consent on 07/30/2023 for a virtual visit (video or telephone). Regina Loveless, PA-C  Date: 07/30/2023 10:54 AM  Virtual Visit via Video Note   I, Regina Snyder, connected with  Regina Snyder  ([id number], 04/02/1976) on 07/30/23 at 10:45 AM EDT by a video-enabled telemedicine application and verified that I am speaking with the correct person using two identifiers.  Location: Patient: Virtual Visit Location  Patient: Home Provider: Virtual Visit Location Provider: Home Office   I discussed the limitations of evaluation and management by telemedicine and the availability of in person appointments. The patient expressed understanding and agreed to proceed.    History of Present Illness: Regina Snyder is a 47 y.o. who identifies as a female who was assigned female at birth, and is being seen today for possible pink eye.  HPI: Conjunctivitis  The current episode started today. The onset was sudden. The problem occurs rarely. The problem has been gradually worsening. The problem is mild. Nothing relieves the symptoms. Nothing aggravates the symptoms. Associated symptoms include eye itching, congestion, rhinorrhea, eye discharge and eye redness. Pertinent negatives include no fever, no decreased vision, no double vision, no photophobia, no ear discharge, no ear pain, no headaches, no hearing loss, no sore throat and no eye pain. The eye pain is mild. The left eye is affected. The eye pain is not associated with movement. The eyelid exhibits no abnormality.     Problems:  Patient Active Problem List   Diagnosis Date Noted   Moderate protein-calorie malnutrition (HCC) 06/29/2023   Positive result for methicillin resistant Staphylococcus aureus (MRSA) screening 06/29/2023   Perimenopause 06/29/2023   Cellulitis of left lower extremity 06/27/2023   Right supracondylar humerus fracture 06/14/2023   Closed fracture of right proximal humerus 06/14/2023   Obesity hypoventilation syndrome (HCC) 06/14/2023   Hematoma of frontal scalp 06/14/2023   Chronic acquired lymphedema 06/14/2023   History of headache 11/07/2021  History of COVID-19 01/19/2021   Sprain of knee 05/03/2020   Morbid obesity (HCC) 04/29/2020   Obesity, Class III, BMI 40-49.9 (morbid obesity) (HCC) 04/29/2020   Venous insufficiency 04/29/2020   History of MRSA infection 04/29/2020   Varicose veins of both lower extremities with  inflammation 04/29/2020   Skin irritation 04/29/2020   Encounter for incision and drainage procedure 04/29/2020   Pain in both lower extremities 04/29/2020   Abdominal wall cellulitis 03/18/2020   Abscess of abdominal wall 03/18/2020   Abscess of right arm 03/18/2020    Allergies:  Allergies  Allergen Reactions   Codeine Nausea And Vomiting   Z-Pak [Azithromycin] Other (See Comments)    Unknown reaction   Nickel Rash   Vibra-Tab [Doxycycline] Rash   Medications:  Current Outpatient Medications:    acetaminophen (TYLENOL) 325 MG tablet, Take 2 tablets (650 mg total) by mouth every 6 (six) hours as needed for mild pain (or Fever >/= 101)., Disp: , Rfl:    chlorhexidine (HIBICLENS) 4 % external liquid, Apply 15 mLs (1 Application total) topically as directed for 30 doses. Use as directed daily for 5 days every other week for 6 weeks., Disp: 946 mL, Rfl: 1   ibuprofen (ADVIL) 200 MG tablet, Take 600-800 mg by mouth daily as needed for headache or moderate pain., Disp: , Rfl:    oxyCODONE (ROXICODONE) 5 MG immediate release tablet, Take 1 tablet (5 mg total) by mouth every 8 (eight) hours as needed., Disp: 20 tablet, Rfl: 0   polyethylene glycol (MIRALAX / GLYCOLAX) 17 g packet, Take 17 g by mouth daily., Disp: 14 each, Rfl: 0   senna-docusate (SENOKOT-S) 8.6-50 MG tablet, Take 1 tablet by mouth at bedtime., Disp: , Rfl:    trimethoprim-polymyxin b (POLYTRIM) ophthalmic solution, Place 1 drop into the left eye every 4 (four) hours. X 5 day, Disp: 10 mL, Rfl: 0  Observations/Objective: Patient is well-developed, well-nourished in no acute distress.  Resting comfortably at home.  Head is normocephalic, atraumatic.  No labored breathing.  Speech is clear and coherent with logical content.  Patient is alert and oriented at baseline.    Assessment and Plan: 1. Bacterial conjunctivitis of left eye - trimethoprim-polymyxin b (POLYTRIM) ophthalmic solution; Place 1 drop into the left eye  every 4 (four) hours. X 5 day  Dispense: 10 mL; Refill: 0  - Suspect bacterial conjunctivitis - Polytrim prescribed - Warm compresses - Good hand hygiene - Seek in person evaluation if symptoms worsen or fail to improve   Follow Up Instructions: I discussed the assessment and treatment plan with the patient. The patient was provided an opportunity to ask questions and all were answered. The patient agreed with the plan and demonstrated an understanding of the instructions.  A copy of instructions were sent to the patient via MyChart unless otherwise noted below.    The patient was advised to call back or seek an in-person evaluation if the symptoms worsen or if the condition fails to improve as anticipated.  Time:  I spent 8 minutes with the patient via telehealth technology discussing the above problems/concerns.    Regina Loveless, PA-C

## 2023-07-31 ENCOUNTER — Ambulatory Visit: Payer: BC Managed Care – PPO | Admitting: Family Medicine

## 2023-07-31 ENCOUNTER — Encounter: Payer: Self-pay | Admitting: Family Medicine

## 2023-07-31 VITALS — HR 107 | Temp 99.1°F | Ht 70.0 in | Wt >= 6400 oz

## 2023-07-31 DIAGNOSIS — I890 Lymphedema, not elsewhere classified: Secondary | ICD-10-CM

## 2023-07-31 DIAGNOSIS — L03311 Cellulitis of abdominal wall: Secondary | ICD-10-CM

## 2023-07-31 DIAGNOSIS — S42291D Other displaced fracture of upper end of right humerus, subsequent encounter for fracture with routine healing: Secondary | ICD-10-CM

## 2023-07-31 DIAGNOSIS — Z22322 Carrier or suspected carrier of Methicillin resistant Staphylococcus aureus: Secondary | ICD-10-CM

## 2023-07-31 NOTE — Patient Instructions (Addendum)
Hibiclens:  Apply 15 mLs (1 Application total) topically as directed for 30 doses. Use as directed daily for 5 days every other week for 6 weeks.  Use along with Mupirocin - 1 Application Nasal 2 times daily, Use as directed 2 times daily for 5 days every other week for 6 weeks.

## 2023-07-31 NOTE — Progress Notes (Signed)
Established patient visit   Patient: Regina Snyder   DOB: 11-Oct-1976   47 y.o. Female  MRN: [id number] Visit Date: 07/31/2023  Today's healthcare provider: Sherlyn Hay, DO   Chief Complaint  Patient presents with   Cellulitis    Patient states her left leg is doing better.  She states that the redness is not quite as bad and it is not as swollen.She is pleased that she has been able to loose 5 pounds.     Subjective    HPI Has some movement of right arm, not able to lift very well. She saw in [facility] 07/31/2023 Working now. Going well so far. Patient's mood is significantly improved, which she attributes to being able to go back to work.  Cellulitis of left lower extremity resolved Pain in her left knee has significantly improved. Numbness persists in anterior left knee Patient is unsure if any change in lymphedema  Has been sexually active recently Some concern as she has found a lump in her left breast, but wasn't able to find it today.  Has been sleeping with a wedge and is waking up sweating a lot, approx. 3-4 times over the past two weeks or so (since she started sleeping in her bed). Started sleeping in her bed before school started again, which has helping her swelling.  - She states that the sweating is not intolerable at this point  - She has not had her period since approximately February 2024  Occasionally taking tylenol for her shoulder or an occasional headache.  Boyfriend is living with her and helping out with a lot of things, which is both helpful and uncomfortable, as she is used to living on her own.     Medications: Outpatient Medications Prior to Visit  Medication Sig   acetaminophen (TYLENOL) 325 MG tablet Take 2 tablets (650 mg total) by mouth every 6 (six) hours as needed for mild pain (or Fever >/= 101).   ibuprofen (ADVIL) 200 MG tablet Take 600-800 mg by mouth daily as needed for headache or moderate pain.   [DISCONTINUED]  chlorhexidine (HIBICLENS) 4 % external liquid Apply 15 mLs (1 Application total) topically as directed for 30 doses. Use as directed daily for 5 days every other week for 6 weeks.   [DISCONTINUED] oxyCODONE (ROXICODONE) 5 MG immediate release tablet Take 1 tablet (5 mg total) by mouth every 8 (eight) hours as needed.   [DISCONTINUED] polyethylene glycol (MIRALAX / GLYCOLAX) 17 g packet Take 17 g by mouth daily.   [DISCONTINUED] senna-docusate (SENOKOT-S) 8.6-50 MG tablet Take 1 tablet by mouth at bedtime.   [DISCONTINUED] trimethoprim-polymyxin b (POLYTRIM) ophthalmic solution Place 1 drop into the left eye every 4 (four) hours. X 5 day   No facility-administered medications prior to visit.    Review of Systems  Constitutional:  Negative for appetite change, chills, fatigue and fever.  Respiratory:  Negative for chest tightness and shortness of breath.   Cardiovascular:  Negative for chest pain and palpitations.  Gastrointestinal:  Negative for abdominal pain, nausea and vomiting.  Endocrine:       +sweating episodes at night  Genitourinary:  Negative for vaginal bleeding.  Musculoskeletal:  Positive for arthralgias (R shoulder; improving).  Neurological:  Positive for numbness. Negative for dizziness and weakness (RUE; improving).  Psychiatric/Behavioral:  Negative for dysphoric mood.          Objective    Pulse (!) 107   Temp 99.1 F (37.3 C) (  Oral)   Ht 5\' 10"  (1.778 m)   Wt (!) 410 lb (186 kg)   SpO2 96%   BMI 58.83 kg/m     Physical Exam Vitals and nursing note reviewed.  Constitutional:      General: She is not in acute distress.    Appearance: Normal appearance.  HENT:     Head: Normocephalic and atraumatic.  Eyes:     General: No scleral icterus.    Conjunctiva/sclera: Conjunctivae normal.  Cardiovascular:     Rate and Rhythm: Normal rate.  Pulmonary:     Effort: Pulmonary effort is normal.  Musculoskeletal:     Right lower leg: Edema present.     Left  lower leg: Edema present.  Skin:    General: Skin is warm and dry.     Findings: No erythema.  Neurological:     Mental Status: She is alert and oriented to person, place, and time. Mental status is at baseline.  Psychiatric:        Mood and Affect: Mood normal.        Behavior: Behavior normal.      No results found for any visits on 07/31/23.  Assessment & Plan    Chronic acquired lymphedema Assessment & Plan: Patient was unable to start lymphedema therapy due to immobility and weakness of the right upper extremity, given her recent fracture and surgery.  Will plan to rerefer her in the future when she is cleared by orthopedics.   Other closed displaced fracture of proximal end of right humerus with routine healing, subsequent encounter Assessment & Plan: Managed by orthopedics.  Will defer to them. Improving as expected.   Positive result for methicillin resistant Staphylococcus aureus (MRSA) screening  Abdominal wall cellulitis Assessment & Plan: Patient was unable to get the Hibiclens that was prescribed for her previously.  Located Hibiclens on [employer], which was a better price for the patient and gave her the information for it, so that she can start her treatment to try to eliminate her suspected MRSA colonization.    Return in about 5 months (around 12/31/2023).  Possible referral to OT for lymphedema therapy, routine follow-up.     I discussed the assessment and treatment plan with the patient  The patient was provided an opportunity to ask questions and all were answered. The patient agreed with the plan and demonstrated an understanding of the instructions.   The patient was advised to call back or seek an in-person evaluation if the symptoms worsen or if the condition fails to improve as anticipated.  Total time was 50 minutes. That includes chart review before the visit, the actual patient visit, and time spent on documentation after the visit.    Sherlyn Hay, DO  [facility] [phone] (phone) [phone] (fax)  [facility]

## 2023-08-06 ENCOUNTER — Encounter: Payer: Self-pay | Admitting: Family Medicine

## 2023-08-06 NOTE — Assessment & Plan Note (Signed)
Patient was unable to start lymphedema therapy due to immobility and weakness of the right upper extremity, given her recent fracture and surgery.  Will plan to rerefer her in the future when she is cleared by orthopedics.

## 2023-08-06 NOTE — Assessment & Plan Note (Signed)
Patient was unable to get the Hibiclens that was prescribed for her previously.  Located Hibiclens on [employer], which was a better price for the patient and gave her the information for it, so that she can start her treatment to try to eliminate her suspected MRSA colonization.

## 2023-08-06 NOTE — Assessment & Plan Note (Signed)
Managed by orthopedics.  Will defer to them. Improving as expected.

## 2024-08-25 ENCOUNTER — Encounter: Payer: Self-pay | Admitting: Family Medicine

## 2024-08-25 ENCOUNTER — Ambulatory Visit: Payer: Self-pay

## 2024-08-25 ENCOUNTER — Encounter: Payer: Self-pay | Admitting: Physician Assistant

## 2024-08-25 ENCOUNTER — Ambulatory Visit: Admitting: Physician Assistant

## 2024-08-25 VITALS — BP 148/95 | HR 83 | Ht 70.0 in | Wt >= 6400 oz

## 2024-08-25 DIAGNOSIS — E662 Morbid (severe) obesity with alveolar hypoventilation: Secondary | ICD-10-CM

## 2024-08-25 DIAGNOSIS — E66813 Obesity, class 3: Secondary | ICD-10-CM

## 2024-08-25 DIAGNOSIS — J683 Other acute and subacute respiratory conditions due to chemicals, gases, fumes and vapors: Secondary | ICD-10-CM | POA: Diagnosis not present

## 2024-08-25 MED ORDER — ALBUTEROL SULFATE HFA 108 (90 BASE) MCG/ACT IN AERS
2.0000 | INHALATION_SPRAY | Freq: Four times a day (QID) | RESPIRATORY_TRACT | 0 refills | Status: AC | PRN
Start: 2024-08-25 — End: ?

## 2024-08-25 MED ORDER — PREDNISONE 20 MG PO TABS
20.0000 mg | ORAL_TABLET | Freq: Every day | ORAL | 0 refills | Status: AC
Start: 2024-08-25 — End: ?

## 2024-08-25 NOTE — Telephone Encounter (Signed)
 FYI Only or Action Required?: FYI only for provider.  Patient was last seen in primary care on 07/31/2023 by Donzella Lauraine SAILOR, DO.  Called Nurse Triage reporting Nasal Congestion and Cough.  Symptoms began several weeks ago.  Interventions attempted: Rest, hydration, or home remedies.  Symptoms are: gradually worsening.  Triage Disposition: See PCP When Office is Open (Within 3 Days)  Patient/caregiver understands and will follow disposition?: Yes   Copied from CRM #[id number]. Topic: Clinical - Red Word Triage >> Aug 25, 2024  8:51 AM Turkey B wrote: Patient has  cough, wheezing stuffy head and nose Reason for Disposition  [1] Sinus congestion (pressure, fullness) AND [2] present > 10 days  Answer Assessment - Initial Assessment Questions 1. LOCATION: Where does it hurt?      Frontal  2. ONSET: When did the sinus pain start?  (e.g., hours, days)      Two Weeks  3. SEVERITY: How bad is the pain?   (Scale 0-10; or none, mild, moderate or severe)     Mild to Moderate  4. RECURRENT SYMPTOM: Have you ever had sinus problems before? If Yes, ask: When was the last time? and What happened that time?      No  5. NASAL CONGESTION: Is the nose blocked? If Yes, ask: Can you open it or must you breathe through your mouth?     Yes  6. NASAL DISCHARGE: Do you have discharge from your nose? If so ask, What color?     Yes, Green  7. FEVER: Do you have a fever? If Yes, ask: What is it, how was it measured, and when did it start?      No  8. OTHER SYMPTOMS: Do you have any other symptoms? (e.g., sore throat, cough, earache, difficulty breathing)     Cough  9. PREGNANCY: Is there any chance you are pregnant? When was your last menstrual period?     No and No  Protocols used: Sinus Pain or Congestion-A-AH

## 2024-08-25 NOTE — Progress Notes (Signed)
 Established patient visit  Patient: Regina Snyder   DOB: 04-May-1976   48 y.o. Female  MRN: [id number] Visit Date: 08/25/2024  Today's healthcare provider: Jolynn Spencer, PA-C   Chief Complaint  Patient presents with   URI    Nasal Congestion and Cough, sinus pressure/pain. Patient having some wheezing. Frquency:x2 weeks.    Subjective     HPI     URI    Additional comments: Nasal Congestion and Cough, sinus pressure/pain. Patient having some wheezing. Frquency:x2 weeks.       Last edited by Regina Snyder, CMA on 08/25/2024  4:19 PM.       Discussed the use of AI scribe software for clinical note transcription with the patient, who gave verbal consent to proceed.  History of Present Illness Regina Snyder is a 48 year old female who presents with cough, wheezing, and shortness of breath.  She has experienced these symptoms for two weeks, with recent worsening. Initially, she had nasal congestion and sore throat, which have resolved, but she continues to have sinus congestion and pressure. She denies fever. 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 She has a history of bronchitis and has used albuterol  and antibiotics effectively in the past. She denies COPD, asthma, and diabetes, and she is a non-smoker.       08/25/2024    4:13 PM 04/29/2020    8:54 AM  Depression screen PHQ 2/9  Decreased Interest 1 0  Down, Depressed, Hopeless 0 0  PHQ - 2 Score 1 0  Altered sleeping 2 0  Tired, decreased energy 1 0  Change in appetite 0 0  Feeling bad or failure about yourself  0 0  Trouble concentrating 0 0  Moving slowly or fidgety/restless 0 0  Suicidal thoughts 0 0  PHQ-9 Score 4 0  Difficult doing work/chores Not difficult at all Not difficult at all     Medications: Outpatient Medications Prior to Visit  Medication Sig   acetaminophen  (TYLENOL ) 325 MG tablet Take 2 tablets (650 mg total) by mouth  every 6 (six) hours as needed for mild pain (or Fever >/= 101).   ibuprofen  (ADVIL ) 200 MG tablet Take 600-800 mg by mouth daily as needed for headache or moderate pain.   No facility-administered medications prior to visit.    Review of Systems All negative Except see HPI       Objective    BP (!) 148/95 (BP Location: Right Wrist, Patient Position: Sitting, Cuff Size: Normal)   Pulse 83   Ht 5' 10 (1.778 m)   Wt (!) 435 lb 4.8 oz (197.5 kg)   SpO2 98%   BMI 62.46 kg/m     Physical Exam Vitals reviewed.  Constitutional:      General: She is not in acute distress.    Appearance: Normal appearance. She is well-developed. She is not diaphoretic.  HENT:     Head: Normocephalic and atraumatic.  Eyes:     General: No scleral icterus.    Conjunctiva/sclera: Conjunctivae normal.  Neck:     Thyroid: No thyromegaly.  Cardiovascular:     Rate and Rhythm: Normal rate and regular rhythm.     Pulses: Normal pulses.     Heart sounds: Normal heart sounds. No murmur heard. Pulmonary:     Effort: Pulmonary effort is normal. No respiratory distress.     Breath sounds: Normal breath sounds. No wheezing, rhonchi  or rales.  Musculoskeletal:     Cervical back: Neck supple.     Right lower leg: No edema.     Left lower leg: No edema.  Lymphadenopathy:     Cervical: No cervical adenopathy.  Skin:    General: Skin is warm and dry.     Findings: No rash.  Neurological:     Mental Status: She is alert and oriented to person, place, and time. Mental status is at baseline.  Psychiatric:        Mood and Affect: Mood normal.        Behavior: Behavior normal.      No results found for any visits on 08/25/24.      Assessment & Plan  Reactive airways dysfunction syndrome (HCC) (Primary)  - predniSONE (DELTASONE) 20 MG tablet; Take 1 tablet (20 mg total) by mouth daily with breakfast.  Dispense: 10 tablet; Refill: 0 - albuterol  (VENTOLIN  HFA) 108 (90 Base) MCG/ACT inhaler; Inhale 2  puffs into the lungs every 6 (six) hours as needed for wheezing or shortness of breath.  Dispense: 8 g; Refill: 0  Cough and wheezing worsening over two weeks, could acute bronchitis. No COPD or asthma. No fever, reducing likelihood of pneumonia. Abnormal lung sounds. Possible sinus disease contributing to symptoms. - Prescribed albuterol  inhaler for symptomatic relief. - Considered prednisone for inflammation. - If no improvement in 1-2 days, consider starting antibiotics. - If symptoms persist, consider chest x-ray to rule out other causes. Will follow-up  Reactive airways dysfunction syndrome (HCC) (Primary) - predniSONE (DELTASONE) 20 MG tablet; Take 1 tablet (20 mg total) by mouth daily with breakfast.  Dispense: 10 tablet; Refill: 0 - albuterol  (VENTOLIN  HFA) 108 (90 Base) MCG/ACT inhaler; Inhale 2 puffs into the lungs every 6 (six) hours as needed for wheezing or shortness of breath.  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 Needs to follow-up with pcp Will follow-up  No orders of the defined types were placed in this encounter.   No follow-ups on file.   The patient was advised to call back or seek an in-person evaluation if the symptoms worsen or if the condition fails to improve as anticipated.  I discussed the assessment and treatment plan with the patient. The patient was provided an opportunity to ask questions and all were answered. The patient agreed with the plan and demonstrated an understanding of the instructions.  I, Delfina Schreurs, PA-C have reviewed all documentation for this visit. The documentation on 08/25/2024  for the exam, diagnosis, procedures, and orders are all accurate and complete.  Jolynn Spencer, [facility], MMS [facility] [phone] (phone) [phone]  (fax)  [facility]

## 2024-08-28 ENCOUNTER — Telehealth: Payer: Self-pay

## 2024-08-28 NOTE — Telephone Encounter (Signed)
 Copied from CRM [id number]. Topic: Clinical - Medical Advice >> Aug 27, 2024  5:47 PM Delon T wrote: Reason for CRM: feeling a little better but feels like she needs a little more time on the medication before deciding on whether or not to get a chest xray- [phone]
